# Patient Record
Sex: Male | Born: 1965
Health system: Southern US, Community
[De-identification: ages and names within clinical notes are randomized; demographics above are authoritative.]

## PROBLEM LIST (undated history)

## (undated) DIAGNOSIS — T7840XA Allergy, unspecified, initial encounter: Secondary | ICD-10-CM

## (undated) DIAGNOSIS — K635 Polyp of colon: Secondary | ICD-10-CM

## (undated) DIAGNOSIS — K219 Gastro-esophageal reflux disease without esophagitis: Secondary | ICD-10-CM

## (undated) DIAGNOSIS — J45909 Unspecified asthma, uncomplicated: Secondary | ICD-10-CM

## (undated) DIAGNOSIS — F419 Anxiety disorder, unspecified: Secondary | ICD-10-CM

## (undated) DIAGNOSIS — J302 Other seasonal allergic rhinitis: Secondary | ICD-10-CM

## (undated) HISTORY — PX: UPPER GASTROINTESTINAL ENDOSCOPY: SHX188

## (undated) HISTORY — DX: Allergy, unspecified, initial encounter: T78.40XA

## (undated) HISTORY — DX: Unspecified asthma, uncomplicated: J45.909

## (undated) HISTORY — DX: Anxiety disorder, unspecified: F41.9

## (undated) HISTORY — DX: Gastro-esophageal reflux disease without esophagitis: K21.9

## (undated) HISTORY — PX: COLONOSCOPY: SHX174

## (undated) HISTORY — DX: Other seasonal allergic rhinitis: J30.2

## (undated) HISTORY — DX: Polyp of colon: K63.5

---

## 2004-11-11 ENCOUNTER — Emergency Department (HOSPITAL_COMMUNITY): Admission: EM | Admit: 2004-11-11 | Discharge: 2004-11-11 | Payer: Self-pay | Admitting: Emergency Medicine

## 2007-12-05 ENCOUNTER — Ambulatory Visit: Payer: Self-pay | Admitting: Internal Medicine

## 2007-12-05 LAB — CONVERTED CEMR LAB
AST: 27 units/L (ref 0–37)
Albumin: 4.3 g/dL (ref 3.5–5.2)
Alkaline Phosphatase: 78 units/L (ref 39–117)
BUN: 20 mg/dL (ref 6–23)
Bacteria, UA: NEGATIVE
Basophils Relative: 0.6 % (ref 0.0–3.0)
Chloride: 110 meq/L (ref 96–112)
Cholesterol: 178 mg/dL (ref 0–200)
Crystals: NEGATIVE
Eosinophils Relative: 3.2 % (ref 0.0–5.0)
GFR calc Af Amer: 78 mL/min
Glucose, Bld: 88 mg/dL (ref 70–99)
HCT: 43.7 % (ref 39.0–52.0)
Hemoglobin: 15.4 g/dL (ref 13.0–17.0)
Monocytes Absolute: 0.5 10*3/uL (ref 0.1–1.0)
Monocytes Relative: 8.1 % (ref 3.0–12.0)
Nitrite: NEGATIVE
Platelets: 241 10*3/uL (ref 150–400)
Potassium: 4.3 meq/L (ref 3.5–5.1)
RBC: 4.74 M/uL (ref 4.22–5.81)
Specific Gravity, Urine: >=1.03 (ref 1.000–1.03)
Total Protein, Urine: NEGATIVE mg/dL
Total Protein: 7.7 g/dL (ref 6.0–8.3)
WBC: 6.3 10*3/uL (ref 4.5–10.5)
pH: 6 (ref 5.0–8.0)

## 2007-12-08 ENCOUNTER — Encounter: Payer: Self-pay | Admitting: Internal Medicine

## 2007-12-08 ENCOUNTER — Ambulatory Visit: Payer: Self-pay | Admitting: Internal Medicine

## 2007-12-08 DIAGNOSIS — R9431 Abnormal electrocardiogram [ECG] [EKG]: Secondary | ICD-10-CM

## 2007-12-08 DIAGNOSIS — R1013 Epigastric pain: Secondary | ICD-10-CM

## 2007-12-08 DIAGNOSIS — R0609 Other forms of dyspnea: Secondary | ICD-10-CM

## 2007-12-08 DIAGNOSIS — E786 Lipoprotein deficiency: Secondary | ICD-10-CM | POA: Insufficient documentation

## 2007-12-08 DIAGNOSIS — R319 Hematuria, unspecified: Secondary | ICD-10-CM

## 2007-12-08 DIAGNOSIS — K3189 Other diseases of stomach and duodenum: Secondary | ICD-10-CM | POA: Insufficient documentation

## 2007-12-08 DIAGNOSIS — K589 Irritable bowel syndrome without diarrhea: Secondary | ICD-10-CM

## 2007-12-08 DIAGNOSIS — R0989 Other specified symptoms and signs involving the circulatory and respiratory systems: Secondary | ICD-10-CM

## 2007-12-08 DIAGNOSIS — K219 Gastro-esophageal reflux disease without esophagitis: Secondary | ICD-10-CM

## 2007-12-08 LAB — CONVERTED CEMR LAB
Bilirubin Urine: NEGATIVE
Total Protein, Urine: NEGATIVE mg/dL
Urine Glucose: NEGATIVE mg/dL

## 2007-12-20 ENCOUNTER — Encounter: Payer: Self-pay | Admitting: Internal Medicine

## 2007-12-20 ENCOUNTER — Ambulatory Visit: Payer: Self-pay

## 2007-12-22 ENCOUNTER — Ambulatory Visit: Payer: Self-pay | Admitting: Internal Medicine

## 2009-01-31 ENCOUNTER — Ambulatory Visit: Payer: Self-pay | Admitting: Internal Medicine

## 2010-08-06 ENCOUNTER — Encounter: Payer: Self-pay | Admitting: Internal Medicine

## 2010-08-11 ENCOUNTER — Ambulatory Visit (INDEPENDENT_AMBULATORY_CARE_PROVIDER_SITE_OTHER): Payer: BC Managed Care – PPO | Admitting: Internal Medicine

## 2010-08-11 ENCOUNTER — Encounter: Payer: Self-pay | Admitting: Internal Medicine

## 2010-08-11 ENCOUNTER — Telehealth: Payer: Self-pay

## 2010-08-11 VITALS — BP 118/64 | HR 77 | Temp 97.8°F | Resp 16 | Wt 169.0 lb

## 2010-08-11 DIAGNOSIS — L98499 Non-pressure chronic ulcer of skin of other sites with unspecified severity: Secondary | ICD-10-CM | POA: Insufficient documentation

## 2010-08-11 DIAGNOSIS — G4700 Insomnia, unspecified: Secondary | ICD-10-CM

## 2010-08-11 DIAGNOSIS — K219 Gastro-esophageal reflux disease without esophagitis: Secondary | ICD-10-CM

## 2010-08-11 DIAGNOSIS — J45909 Unspecified asthma, uncomplicated: Secondary | ICD-10-CM | POA: Insufficient documentation

## 2010-08-11 DIAGNOSIS — J45990 Exercise induced bronchospasm: Secondary | ICD-10-CM

## 2010-08-11 MED ORDER — BUDESONIDE-FORMOTEROL FUMARATE 160-4.5 MCG/ACT IN AERO
2.0000 | INHALATION_SPRAY | Freq: Two times a day (BID) | RESPIRATORY_TRACT | Status: DC
Start: 2010-08-11 — End: 2012-05-15

## 2010-08-11 MED ORDER — ZOLPIDEM TARTRATE ER 12.5 MG PO TBCR: 12.5000 mg | EXTENDED_RELEASE_TABLET | Freq: Every evening | ORAL | Status: DC | PRN

## 2010-08-11 NOTE — Assessment & Plan Note (Signed)
Start symbicort

## 2010-08-11 NOTE — Progress Notes (Signed)
Subjective:    Patient ID: George [name], male    DOB: 04-15-1965, 45 y.o.   MRN: [id number]  Wheezing  This is a new problem. The current episode started more than 1 month ago. The problem occurs intermittently. The problem has been unchanged. Pertinent negatives include no abdominal pain, chest pain, chills, coryza, coughing, diarrhea, ear pain, fever, headaches, hemoptysis, neck pain, rash, rhinorrhea, shortness of breath, sore throat, sputum production, swollen glands or vomiting. He has tried nothing for the symptoms. There is no history of asthma, bronchiolitis, chronic lung disease, COPD or pneumonia.      Review of Systems  Constitutional: Negative for fever, chills, diaphoresis, activity change, appetite change, fatigue and unexpected weight change.  HENT: Negative for ear pain, sore throat, facial swelling, rhinorrhea, trouble swallowing, neck pain and neck stiffness.   Eyes: Negative for photophobia and visual disturbance.  Respiratory: Positive for wheezing. Negative for apnea, cough, hemoptysis, sputum production, choking, chest tightness, shortness of breath and stridor.   Cardiovascular: Negative for chest pain, palpitations and leg swelling.  Gastrointestinal: Negative for nausea, vomiting, abdominal pain, diarrhea, constipation, blood in stool, abdominal distention, anal bleeding and rectal pain.  Genitourinary: Negative for dysuria, urgency, frequency, hematuria, decreased urine volume and difficulty urinating.  Musculoskeletal: Negative for myalgias, back pain, joint swelling, arthralgias and gait problem.  Skin: Negative for color change, pallor and rash.  Neurological: Negative for dizziness, tremors, seizures, syncope, facial asymmetry, speech difficulty, weakness, light-headedness, numbness and headaches.  Hematological: Negative for adenopathy. Does not bruise/bleed easily.  Psychiatric/Behavioral: Positive for sleep disturbance (intermittent DFA). Negative for suicidal  ideas, hallucinations, behavioral problems, confusion, self-injury, dysphoric mood, decreased concentration and agitation. The patient is not nervous/anxious and is not hyperactive.        Objective:   Physical Exam  Constitutional: He is oriented to person, place, and time. He appears well-developed and well-nourished. No distress.  HENT:  Head: Normocephalic and atraumatic.  Right Ear: External ear normal.  Left Ear: External ear normal.  Nose: Nose normal.  Mouth/Throat: No oropharyngeal exudate.  Eyes: Conjunctivae and EOM are normal. Pupils are equal, round, and reactive to light. Right eye exhibits no discharge. Left eye exhibits no discharge. No scleral icterus.  Neck: Normal range of motion. Neck supple. No JVD present. No tracheal deviation present. No thyromegaly present.  Cardiovascular: Normal rate, regular rhythm, normal heart sounds and intact distal pulses.  Exam reveals no gallop and no friction rub.   No murmur heard. Pulmonary/Chest: Effort normal and breath sounds normal. No stridor. No respiratory distress. He has no wheezes. He has no rales. He exhibits no tenderness.  Abdominal: Soft. Bowel sounds are normal. He exhibits no distension and no mass. There is no tenderness. There is no rebound and no guarding.  Musculoskeletal: Normal range of motion. He exhibits no edema and no tenderness.  Lymphadenopathy:    He has no cervical adenopathy.  Neurological: He is alert and oriented to person, place, and time. He has normal reflexes. He displays normal reflexes. No cranial nerve deficit. He exhibits normal muscle tone. Coordination normal.  Skin: Skin is warm and intact. No abrasion, no bruising, no burn, no ecchymosis, no lesion and no rash noted. He is not diaphoretic. No erythema. No pallor.       Large callous with ulcer on the plantar side of left foot  Psychiatric: He has a normal mood and affect. His behavior is normal. Judgment and thought content normal.  Assessment & Plan:

## 2010-08-11 NOTE — Patient Instructions (Signed)
Asthma, Adult Asthma is caused by narrowing of the air passages in the lungs. It may be triggered by pollen, dust, animal dander, molds, some foods, respiratory infections, exposure to smoke, exercise, emotional stress or other allergens (things that cause allergic reactions or allergies). Repeat attacks are common. HOME CARE INSTRUCTIONS  Use prescription medications as ordered by your caregiver.   Avoid pollen, dust, animal dander, molds, smoke and other things that cause attacks at home and at work.   You may have fewer attacks if you decrease dust in your home. Electrostatic air cleaners may help.   It may help to replace your pillows or mattress with materials less likely to cause allergies.   Talk to your caregiver about an action plan for managing asthma attacks at home, including, the use of a peak flow meter which measures the severity of your asthma attack. An action plan can help minimize or stop the attack without having to seek medical care.   If you are not on a fluid restriction, drink 8 to 10 glasses of water each day.   Always have a plan prepared for seeking medical attention, including, calling your physician, accessing local emergency care, and calling 911 (in the [place] for a severe attack.   Discuss possible exercise routines with your caregiver.   If animal dander is the cause of asthma, you may need to get rid of pets.  SEEK MEDICAL CARE IF:  You have wheezing and shortness of breath even if taking medicine to prevent attacks.   An oral temperature above 100.5 develops.   You have muscle aches, chest pain or thickening of sputum.   Your sputum changes from clear or white to yellow, green, gray or bloody.   You have any problems that may be related to the medicine you are taking (such as a rash, itching, swelling or trouble breathing).  SEEK IMMEDIATE MEDICAL CARE IF:  Your usual medicines do not stop your wheezing or there is increased coughing and/or  shortness of breath.   You have increased difficulty breathing.  You have an oral temperature above 100.5Insomnia Insomnia is frequent trouble falling and/or staying asleep. Insomnia can be a long term problem or a short term problem. Both are common. Insomnia can be a short term problem when the wakefulness is related to a certain stress or worry. Long term insomnia is often related to ongoing stress during waking hours and/or poor sleeping habits. Overtime, sleep deprivation itself can make the problem worse. Every little thing feels more severe because you are overtired and your ability to cope is decreased. SYMPTOMS Not feeling rested in the morning.  Anxiety and restlessness at bedtime.  Difficulty falling and staying asleep.  CAUSES Stress, anxiety, and depression.  Poor sleeping habits.  Distractions such as TV in the bedroom.  Naps close to bedtime.  Engaging in emotionally charged conversations before bed.  Technical reading before sleep.  Alcohol and other sedatives. They may make the problem worse. They can hurt normal sleep patterns and normal dream activity.  Stimulants such as caffeine for several hours prior to bedtime.  Pain syndromes and shortness of breath can cause insomnia.  Exercise late at night.  Changing time zones may cause sleeping problems (jet lag).  It is sometimes helpful to have someone observe your sleeping patterns. They should look for periods of not breathing during the night (sleep apnea). They should also look to see how long those periods last. If you live alone or observers are uncertain, you  can also be observed at a sleep clinic where your sleep patterns will be professionally monitored. Sleep apnea requires a checkup and treatment. Give your caregivers your medical history. Give your caregivers observations your family has made about your sleep.  TREATMENT Your caregiver may prescribe treatment for an underlying medical disorders. Your caregiver can  give advice or help if you are using alcohol or other drugs for self-medication. Treatment of underlying problems will usually eliminate insomnia problems.  Medications can be prescribed for short time use. They are generally not recommended for lengthy use.  Over-the-counter sleep medicines are not recommended for lengthy use. They can be habit forming.  You can promote easier sleeping by making lifestyle changes such as:  Using relaxation techniques that help with breathing and reduce muscle tension.  Exercising earlier in the day.  Changing your diet and the time of your last meal. No night time snacks.  Establish a regular time to go to bed.  Counseling can help with stressful problems and worry.  Soothing music and white noise may be helpful if there are background noises you cannot remove.  Stop tedious detailed work at least one hour before bedtime.  HOME CARE INSTRUCTIONS Keep a diary. Inform your caregiver about your progress. This includes any medication side effects. See your caregiver regularly. Take note of:  Times when you are asleep. Times when you are awake during the night.  The quality of your sleep. How you feel the next day.   This information will help your caregiver care for you. Get out of bed if you are still awake after 15 minutes. Read or do some quiet activity. Keep the lights down. Wait until you feel sleepy and go back to bed.  Keep regular sleeping and waking hours. Avoid naps.  Exercise regularly.  Avoid distractions at bedtime. Distractions include watching television or engaging in any intense or detailed activity like attempting to balance the household checkbook.  Develop a bedtime ritual. Keep a familiar routine of bathing, brushing your teeth, climbing into bed at the same time each night, listening to soothing music. Routines increase the success of falling to sleep faster.  Use relaxation techniques. This can be using breathing and muscle tension release  routines. It can also include visualizing peaceful scenes. You can also help control troubling or intruding thoughts by keeping your mind occupied with boring or repetitive thoughts like the old concept of counting sheep. You can make it more creative like imagining planting one beautiful flower after another in your backyard garden.  During your day, work to eliminate stress. When this is not possible use some of the previous suggestions to help reduce the anxiety that accompanies stressful situations.  MAKE SURE YOU:  Understand these instructions.  Will watch your condition.  Will get help right away if you are not doing well or get worse.  Document Released: 02/27/2000 Document Re-Released: 02/12/2008  [facility] Patient Information 2011 [place], [place]., not controlled by medicine.  MAKE SURE YOU:  Understand these instructions.   Will watch your condition.   Will get help right away if you are not doing well or get worse.  Document Released: 03/01/2005 Document Re-Released: 03/23/2009 [facility] Patient Information 2011 [place], [place].

## 2010-08-11 NOTE — Telephone Encounter (Signed)
abstract

## 2010-08-11 NOTE — Assessment & Plan Note (Signed)
-  podiatry referral

## 2010-08-11 NOTE — Assessment & Plan Note (Signed)
Try ambien-cr and pt ed material was given as well

## 2010-08-11 NOTE — Assessment & Plan Note (Signed)
This is well-controlled.

## 2011-04-09 ENCOUNTER — Ambulatory Visit (INDEPENDENT_AMBULATORY_CARE_PROVIDER_SITE_OTHER): Payer: BC Managed Care – PPO | Admitting: Internal Medicine

## 2011-04-09 ENCOUNTER — Encounter: Payer: Self-pay | Admitting: Internal Medicine

## 2011-04-09 ENCOUNTER — Ambulatory Visit (INDEPENDENT_AMBULATORY_CARE_PROVIDER_SITE_OTHER)
Admission: RE | Admit: 2011-04-09 | Discharge: 2011-04-09 | Disposition: A | Discharge status: Home / Self Care | Payer: BC Managed Care – PPO | Source: Ambulatory Visit | Attending: Internal Medicine | Admitting: Internal Medicine

## 2011-04-09 VITALS — BP 120/80 | HR 76 | Temp 97.4°F | Resp 16 | Wt 176.0 lb

## 2011-04-09 DIAGNOSIS — J209 Acute bronchitis, unspecified: Secondary | ICD-10-CM | POA: Insufficient documentation

## 2011-04-09 DIAGNOSIS — R05 Cough: Secondary | ICD-10-CM

## 2011-04-09 DIAGNOSIS — R059 Cough, unspecified: Secondary | ICD-10-CM | POA: Insufficient documentation

## 2011-04-09 MED ORDER — GUAIFENESIN-CODEINE 100-10 MG/5ML PO SYRP: 5.0000 mL | ORAL_SOLUTION | Freq: Three times a day (TID) | ORAL | Status: AC | PRN

## 2011-04-09 MED ORDER — MOXIFLOXACIN HCL 400 MG PO TABS: 400.0000 mg | ORAL_TABLET | Freq: Every day | ORAL | Status: AC

## 2011-04-09 NOTE — Progress Notes (Signed)
Subjective:    Patient ID: George [name], male    DOB: December 20, 1965, 46 y.o.   MRN: [id number]  Cough This is a new problem. The current episode started 1 to 4 weeks ago. The problem has been gradually worsening. The problem occurs every few hours. The cough is productive of purulent sputum. Associated symptoms include chills, rhinorrhea and a sore throat. Pertinent negatives include no chest pain, ear congestion, ear pain, fever, headaches, heartburn, hemoptysis, myalgias, nasal congestion, postnasal drip, rash, shortness of breath, sweats, weight loss or wheezing. The symptoms are aggravated by nothing. He has tried OTC cough suppressant for the symptoms. The treatment provided mild relief. His past medical history is significant for asthma.      Review of Systems  Constitutional: Positive for chills. Negative for fever, weight loss, diaphoresis, activity change, appetite change, fatigue and unexpected weight change.  HENT: Positive for sore throat and rhinorrhea. Negative for ear pain, nosebleeds, congestion, sneezing, trouble swallowing, neck pain, neck stiffness, voice change, postnasal drip, sinus pressure and ear discharge.   Eyes: Negative.   Respiratory: Positive for cough. Negative for apnea, hemoptysis, choking, chest tightness, shortness of breath, wheezing and stridor.   Cardiovascular: Negative for chest pain, palpitations and leg swelling.  Gastrointestinal: Negative for heartburn, nausea, vomiting, abdominal pain, diarrhea, constipation, blood in stool and abdominal distention.  Genitourinary: Negative.   Musculoskeletal: Negative for myalgias, back pain, joint swelling, arthralgias and gait problem.  Skin: Negative for color change, pallor, rash and wound.  Neurological: Negative for dizziness, tremors, seizures, syncope, facial asymmetry, speech difficulty, weakness, light-headedness, numbness and headaches.  Hematological: Negative for adenopathy. Does not bruise/bleed easily.    Psychiatric/Behavioral: Negative.        Objective:   Physical Exam  Vitals reviewed. Constitutional: He is oriented to person, place, and time. He appears well-developed and well-nourished. No distress.  HENT:  Head: No trismus in the jaw.  Right Ear: Hearing, tympanic membrane, external ear and ear canal normal.  Left Ear: Hearing, tympanic membrane, external ear and ear canal normal.  Nose: Mucosal edema and rhinorrhea present. Right sinus exhibits no maxillary sinus tenderness and no frontal sinus tenderness. Left sinus exhibits no maxillary sinus tenderness and no frontal sinus tenderness.  Mouth/Throat: Oropharynx is clear and moist and mucous membranes are normal. Mucous membranes are not pale, not dry and not cyanotic. No uvula swelling. No oropharyngeal exudate, posterior oropharyngeal edema, posterior oropharyngeal erythema or tonsillar abscesses.  Eyes: Conjunctivae are normal. Right eye exhibits no discharge. Left eye exhibits no discharge. No scleral icterus.  Neck: Normal range of motion. Neck supple. No JVD present. No tracheal deviation present. No thyromegaly present.  Cardiovascular: Normal rate, regular rhythm, normal heart sounds and intact distal pulses.  Exam reveals no gallop and no friction rub.   No murmur heard. Pulmonary/Chest: Effort normal. No accessory muscle usage or stridor. Not tachypneic. No respiratory distress. He has no decreased breath sounds. He has no wheezes. He has no rhonchi. He has rales in the right lower field. He exhibits no tenderness.  Abdominal: Soft. Bowel sounds are normal. He exhibits no distension and no mass. There is no tenderness. There is no rebound and no guarding.  Musculoskeletal: Normal range of motion. He exhibits no edema and no tenderness.  Lymphadenopathy:    He has no cervical adenopathy.  Neurological: He is oriented to person, place, and time.  Skin: Skin is warm and dry. No rash noted. He is not diaphoretic. No erythema.  No pallor.  Psychiatric: He has a normal mood and affect. His behavior is normal. Judgment and thought content normal.          Assessment & Plan:

## 2011-04-09 NOTE — Assessment & Plan Note (Signed)
Check a CXR for pna, mass, edema

## 2011-04-09 NOTE — Assessment & Plan Note (Signed)
Start avelox and a cough suppressant 

## 2011-04-09 NOTE — Patient Instructions (Signed)

## 2011-06-21 ENCOUNTER — Encounter: Payer: BC Managed Care – PPO | Admitting: Internal Medicine

## 2011-06-25 ENCOUNTER — Other Ambulatory Visit (INDEPENDENT_AMBULATORY_CARE_PROVIDER_SITE_OTHER): Payer: BC Managed Care – PPO

## 2011-06-25 ENCOUNTER — Ambulatory Visit (INDEPENDENT_AMBULATORY_CARE_PROVIDER_SITE_OTHER): Payer: BC Managed Care – PPO | Admitting: Internal Medicine

## 2011-06-25 ENCOUNTER — Encounter: Payer: Self-pay | Admitting: Internal Medicine

## 2011-06-25 VITALS — BP 112/72 | HR 58 | Temp 97.3°F | Resp 16 | Wt 174.0 lb

## 2011-06-25 DIAGNOSIS — Z0000 Encounter for general adult medical examination without abnormal findings: Secondary | ICD-10-CM

## 2011-06-25 DIAGNOSIS — G4700 Insomnia, unspecified: Secondary | ICD-10-CM

## 2011-06-25 DIAGNOSIS — Z23 Encounter for immunization: Secondary | ICD-10-CM

## 2011-06-25 LAB — COMPREHENSIVE METABOLIC PANEL
BUN: 24 mg/dL — ABNORMAL HIGH (ref 6–23)
CO2: 30 mEq/L (ref 19–32)
GFR: 72.31 mL/min (ref >60.00)
Glucose, Bld: 89 mg/dL (ref 70–99)
Sodium: 140 mEq/L (ref 135–145)
Total Bilirubin: 0.6 mg/dL (ref 0.3–1.2)
Total Protein: 7.2 g/dL (ref 6.0–8.3)

## 2011-06-25 LAB — CBC WITH DIFFERENTIAL/PLATELET
Basophils Relative: 0.6 % (ref 0.0–3.0)
Eosinophils Relative: 4.4 % (ref 0.0–5.0)
HCT: 42.3 % (ref 39.0–52.0)
Hemoglobin: 14.3 g/dL (ref 13.0–17.0)
Lymphs Abs: 2.1 10*3/uL (ref 0.7–4.0)
MCV: 92.7 fl (ref 78.0–100.0)
Monocytes Absolute: 0.5 10*3/uL (ref 0.1–1.0)
Monocytes Relative: 8.3 % (ref 3.0–12.0)
Neutro Abs: 3.1 10*3/uL (ref 1.4–7.7)
Platelets: 209 10*3/uL (ref 150.0–400.0)
RBC: 4.56 Mil/uL (ref 4.22–5.81)
WBC: 6 10*3/uL (ref 4.5–10.5)

## 2011-06-25 LAB — LIPID PANEL
Cholesterol: 221 mg/dL — ABNORMAL HIGH (ref 0–200)
HDL: 52.1 mg/dL (ref >39.00)

## 2011-06-25 LAB — TSH: TSH: 3.07 u[IU]/mL (ref 0.35–5.50)

## 2011-06-25 MED ORDER — ZOLPIDEM TARTRATE ER 12.5 MG PO TBCR: 12.5000 mg | EXTENDED_RELEASE_TABLET | Freq: Every evening | ORAL | Status: DC | PRN

## 2011-06-25 NOTE — Assessment & Plan Note (Signed)
Exam done, vaccines were updated, pt ed material was given, labs ordered 

## 2011-06-25 NOTE — Patient Instructions (Signed)
Health Maintenance, Males A healthy lifestyle and preventative care can promote health and wellness.  Maintain regular health, dental, and eye exams.   Eat a healthy diet. Foods like vegetables, fruits, whole grains, low-fat dairy products, and lean protein foods contain the nutrients you need without too many calories. Decrease your intake of foods high in solid fats, added sugars, and salt. Get information about a proper diet from your caregiver, if necessary.   Regular physical exercise is one of the most important things you can do for your health. Most adults should get at least 150 minutes of moderate-intensity exercise (any activity that increases your heart rate and causes you to sweat) each week. In addition, most adults need muscle-strengthening exercises on 2 or more days a week.    Maintain a healthy weight. The body mass index (BMI) is a screening tool to identify possible weight problems. It provides an estimate of body fat based on height and weight. Your caregiver can help determine your BMI, and can help you achieve or maintain a healthy weight. For adults 20 years and older:   A BMI below 18.5 is considered underweight.   A BMI of 18.5 to 24.9 is normal.   A BMI of 25 to 29.9 is considered overweight.   A BMI of 30 and above is considered obese.   Maintain normal blood lipids and cholesterol by exercising and minimizing your intake of saturated fat. Eat a balanced diet with plenty of fruits and vegetables. Blood tests for lipids and cholesterol should begin at age 20 and be repeated every 5 years. If your lipid or cholesterol levels are high, you are over 50, or you are a high risk for heart disease, you may need your cholesterol levels checked more frequently.Ongoing high lipid and cholesterol levels should be treated with medicines, if diet and exercise are not effective.   If you smoke, find out from your caregiver how to quit. If you do not use tobacco, do not start.    If you choose to drink alcohol, do not exceed 2 drinks per day. One drink is considered to be 12 ounces (355 mL) of beer, 5 ounces (148 mL) of wine, or 1.5 ounces (44 mL) of liquor.   Avoid use of street drugs. Do not share needles with anyone. Ask for help if you need support or instructions about stopping the use of drugs.   High blood pressure causes heart disease and increases the risk of stroke. Blood pressure should be checked at least every 1 to 2 years. Ongoing high blood pressure should be treated with medicines if weight loss and exercise are not effective.   If you are 45 to 45 years old, ask your caregiver if you should take aspirin to prevent heart disease.   Diabetes screening involves taking a blood sample to check your fasting blood sugar level. This should be done once every 3 years, after age 45, if you are within normal weight and without risk factors for diabetes. Testing should be considered at a younger age or be carried out more frequently if you are overweight and have at least 1 risk factor for diabetes.   Colorectal cancer can be detected and often prevented. Most routine colorectal cancer screening begins at the age of 50 and continues through age 75. However, your caregiver may recommend screening at an earlier age if you have risk factors for colon cancer. On a yearly basis, your caregiver may provide home test kits to check for hidden   blood in the stool. Use of a small camera at the end of a tube, to directly examine the colon (sigmoidoscopy or colonoscopy), can detect the earliest forms of colorectal cancer. Talk to your caregiver about this at age 50, when routine screening begins. Direct examination of the colon should be repeated every 5 to 10 years through age 75, unless early forms of pre-cancerous polyps or small growths are found.   Hepatitis C blood testing is recommended for all people born from 1945 through 1965 and any individual with known risks for  hepatitis C.   Healthy men should no longer receive prostate-specific antigen (PSA) blood tests as part of routine cancer screening. Consult with your caregiver about prostate cancer screening.   Testicular cancer screening is not recommended for adolescents or adult males who have no symptoms. Screening includes self-exam, caregiver exam, and other screening tests. Consult with your caregiver about any symptoms you have or any concerns you have about testicular cancer.   Practice safe sex. Use condoms and avoid high-risk sexual practices to reduce the spread of sexually transmitted infections (STIs).   Use sunscreen with a sun protection factor (SPF) of 30 or greater. Apply sunscreen liberally and repeatedly throughout the day. You should seek shade when your shadow is shorter than you. Protect yourself by wearing long sleeves, pants, a wide-brimmed hat, and sunglasses year round, whenever you are outdoors.   Notify your caregiver of new moles or changes in moles, especially if there is a change in shape or color. Also notify your caregiver if a mole is larger than the size of a pencil eraser.   A one-time screening for abdominal aortic aneurysm (AAA) and surgical repair of large AAAs by sound wave imaging (ultrasonography) is recommended for ages 65 to 75 years who are current or former smokers.   Stay current with your immunizations.  Document Released: 08/28/2007 Document Revised: 02/18/2011 Document Reviewed: 07/27/2010 [employer] Patient Information 2012 [employer]. 

## 2011-06-25 NOTE — Progress Notes (Signed)
  Subjective:    Patient ID: George [name], male    DOB: [birth year], 46 y.o.   MRN: [id number]  HPI He returns for a routine physical and tells me that he feels well and offers no complaints,   Review of Systems  Constitutional: Negative.   HENT: Negative.   Eyes: Negative.   Respiratory: Negative.   Cardiovascular: Negative.   Gastrointestinal: Negative.   Genitourinary: Negative.   Musculoskeletal: Negative.   Skin: Negative.   Neurological: Negative.   Hematological: Negative.   Psychiatric/Behavioral: Negative.        Objective:   Physical Exam  Vitals reviewed. Constitutional: He is oriented to person, place, and time. He appears well-developed and well-nourished. No distress.  HENT:  Head: Normocephalic and atraumatic.  Mouth/Throat: Oropharynx is clear and moist. No oropharyngeal exudate.  Eyes: Conjunctivae are normal. Right eye exhibits no discharge. Left eye exhibits no discharge. No scleral icterus.  Neck: Normal range of motion. Neck supple. No JVD present. No tracheal deviation present. No thyromegaly present.  Cardiovascular: Normal rate, regular rhythm, normal heart sounds and intact distal pulses.  Exam reveals no gallop and no friction rub.   No murmur heard. Pulmonary/Chest: Effort normal and breath sounds normal. No stridor. No respiratory distress. He has no wheezes. He has no rales. He exhibits no tenderness.  Abdominal: Soft. Bowel sounds are normal. He exhibits no distension and no mass. There is no tenderness. There is no rebound and no guarding. Hernia confirmed negative in the right inguinal area and confirmed negative in the left inguinal area.  Genitourinary: Testes normal and penis normal. Right testis shows no mass, no swelling and no tenderness. Right testis is descended. Left testis shows no mass, no swelling and no tenderness. Left testis is descended. Circumcised. No penile tenderness. No discharge found.  Musculoskeletal: Normal range of motion. He  exhibits no edema and no tenderness.  Lymphadenopathy:    He has no cervical adenopathy.       Right: No inguinal adenopathy present.       Left: No inguinal adenopathy present.  Neurological: He is oriented to person, place, and time.  Skin: Skin is warm and dry. No rash noted. He is not diaphoretic. No erythema. No pallor.  Psychiatric: He has a normal mood and affect. His behavior is normal. Judgment and thought content normal.      Lab Results  Component Value Date   WBC 6.3 12/05/2007   HGB 15.4 12/05/2007   HCT 43.7 12/05/2007   PLT 241 12/05/2007   GLUCOSE 88 12/05/2007   CHOL 178 12/05/2007   TRIG 100 12/05/2007   HDL 34.2* 12/05/2007   LDLCALC 124* 12/05/2007   ALT 25 12/05/2007   AST 27 12/05/2007   NA 143 12/05/2007   K 4.3 12/05/2007   CL 110 12/05/2007   CREATININE 1.3 12/05/2007   BUN 20 12/05/2007   CO2 31 12/05/2007   TSH 3.76 12/05/2007   PSA 0.60 12/05/2007      Assessment & Plan:

## 2011-06-30 ENCOUNTER — Telehealth: Payer: Self-pay | Admitting: *Deleted

## 2011-06-30 DIAGNOSIS — L98499 Non-pressure chronic ulcer of skin of other sites with unspecified severity: Secondary | ICD-10-CM

## 2011-06-30 NOTE — Telephone Encounter (Signed)
Patient request referral to Podiatrists [as done last year], so that he may get custom foot orthotics. Please advise.

## 2011-12-31 ENCOUNTER — Ambulatory Visit: Payer: BC Managed Care – PPO | Admitting: Endocrinology

## 2011-12-31 ENCOUNTER — Telehealth: Payer: Self-pay | Admitting: Internal Medicine

## 2011-12-31 MED ORDER — KETOCONAZOLE 2 % EX CREA: TOPICAL_CREAM | Freq: Two times a day (BID) | CUTANEOUS | Status: DC

## 2011-12-31 NOTE — Telephone Encounter (Signed)
Caller: Wilberth/Patient; Patient Name: George [name]; PCP: Sanda Linger (Adults only); Best Callback Phone Number: [phone]  Patient states he developed "jock itch", onset X several weeks. States has tried OTC Lotrimin without relief. Describes rash as red, itchy rash of bilateral groin area and genital area. Afebrile. States itching has increased, unrelieved by Zyrtec. States itching interferes with sleep and exercise. States spouse was recently treated for  yeast infection. Triage per Rash Protocol. No emergent symptoms identified. Care advice given per guidelines related to triage assessment for " Severe or persistent itching that interferes with ability to carry out usual activities or sleep."  Patient advised to wear loose fitting clothing, cool compresses to area. Patient advised tepid bath with Aveeno or baking soda. Call back parameters reviewed.  Patient verbalizes understanding. Disposition obtained of " See Provider within 24 hours." No appts. available for 12/31/11.  PATIENT REQUESTS APPT. FOR 12/31/11. PLEASE RETURN CALL TO PATIENT AT [phone] REGARDING POSSIBLE WORK IN APPT. Patient uses [facility] on [address] at [phone].

## 2011-12-31 NOTE — Telephone Encounter (Signed)
Try ketoconazole cream

## 2012-01-27 ENCOUNTER — Encounter: Payer: Self-pay | Admitting: Internal Medicine

## 2012-01-27 ENCOUNTER — Ambulatory Visit (INDEPENDENT_AMBULATORY_CARE_PROVIDER_SITE_OTHER): Payer: BC Managed Care – PPO | Admitting: Internal Medicine

## 2012-01-27 VITALS — BP 102/70 | HR 64 | Temp 96.8°F | Ht 68.0 in | Wt 177.0 lb

## 2012-01-27 DIAGNOSIS — G4700 Insomnia, unspecified: Secondary | ICD-10-CM

## 2012-01-27 DIAGNOSIS — B356 Tinea cruris: Secondary | ICD-10-CM

## 2012-01-27 DIAGNOSIS — Z23 Encounter for immunization: Secondary | ICD-10-CM

## 2012-01-27 MED ORDER — GRISEOFULVIN ULTRAMICROSIZE 250 MG PO TABS: 250.0000 mg | ORAL_TABLET | Freq: Two times a day (BID) | ORAL | Status: DC

## 2012-01-27 MED ORDER — ZOLPIDEM TARTRATE ER 12.5 MG PO TBCR: 12.5000 mg | EXTENDED_RELEASE_TABLET | Freq: Every evening | ORAL | Status: DC | PRN

## 2012-01-27 MED ORDER — KETOCONAZOLE POWD: 1000.0000 mg | Freq: Two times a day (BID) | Status: DC

## 2012-01-27 NOTE — Progress Notes (Signed)
Subjective:    Patient ID: George [name], male    DOB: 01/19/1966, 46 y.o.   MRN: [id number]  HPI  Pt reports to the clinic today with c/o a rash that he thinks is jock itch in his groin. He has had it x 1 month. Dr. Jonny Ruiz called him in some ketoconazole cream which has helped but it has not resolved. It is not painful but it is itchy. The itching is constant but worse when he is trying to sleep. He doesn't really like the cream because it makes the are feel moist. He has had jock itch before but never for this long. Pt would also like a refill of his ambien until he can get back in for a follow up with Dr. Jonny Ruiz.  Review of Systems      Past Medical History  Diagnosis Date  . GERD (gastroesophageal reflux disease)   . Fainting spell as child    Current Outpatient Prescriptions  Medication Sig Dispense Refill  . ketoconazole (NIZORAL) 2 % cream Apply topically 2 (two) times daily.  60 g  2  . omeprazole (PRILOSEC) 40 MG capsule Take 40 mg by mouth daily.        . pseudoephedrine (SUDAFED) 30 MG tablet Take 30 mg by mouth.        . zolpidem (AMBIEN CR) 12.5 MG CR tablet Take 1 tablet (12.5 mg total) by mouth at bedtime as needed for sleep.  30 tablet  0  . [DISCONTINUED] zolpidem (AMBIEN CR) 12.5 MG CR tablet Take 1 tablet (12.5 mg total) by mouth at bedtime as needed for sleep.  30 tablet  2  . budesonide-formoterol (SYMBICORT) 160-4.5 MCG/ACT inhaler Inhale 2 puffs into the lungs 2 (two) times daily.  5 Inhaler  0  . griseofulvin (GRIS-PEG) 250 MG tablet Take 1 tablet (250 mg total) by mouth 2 (two) times daily.  28 tablet  0  . KETOCONAZOLE, BULK, POWD 1,000 mg by Does not apply route 2 (two) times daily.  1 Bottle  0    No Known Allergies  Family History  Problem Relation Age of Onset  . Obesity Father   . Asthma Father   . Hypertension Father     History   Social History  . Marital Status: Married    Spouse Name: N/A    Number of Children: N/A  . Years of Education: N/A     Occupational History  . Prof     [employer]   Social History Main Topics  . Smoking status: Never Smoker   . Smokeless tobacco: Never Used  . Alcohol Use: No  . Drug Use: No  . Sexually Active: Yes   Other Topics Concern  . Not on file   Social History Narrative   Regular exercise-yes     Constitutional: Denies fever, malaise, fatigue, headache or abrupt weight changes.  GU: Denies urgency, frequency, pain with urination, burning sensation, blood in urine, odor or discharge. Skin: Pt reports rash in the groin. Denies redness,  lesions or ulcercations.   No other specific complaints in a complete review of systems (except as listed in HPI above).  Objective:   Physical Exam  BP 102/70  Pulse 64  Temp 96.8 F (36 C) (Oral)  Ht 5\' 8"  (1.727 m)  Wt 177 lb (80.287 kg)  BMI 26.91 kg/m2  SpO2 97% Wt Readings from Last 3 Encounters:  01/27/12 177 lb (80.287 kg)  06/25/11 174 lb (78.926 kg)  04/09/11 176  lb (79.833 kg)    General: Appears their stated age, well developed, well nourished in NAD. Skin: Yeast noted in bilateral groins and underneath the scrotum.   Skin otherwise arm, dry and intact. No lesions or ulcerations noted.  Cardiovascular: Normal rate and rhythm. S1,S2 noted.  No murmur, rubs or gallops noted. No JVD or BLE edema. No carotid bruits noted. Pulmonary/Chest: Normal effort and positive vesicular breath sounds. No respiratory distress. No wheezes, rales or ronchi noted.       Assessment & Plan:   Tinea Cruris, new problem with additional workup required:  Stop using Ketoconazole cream and use Ketoconazole powder instead eRx given for Griseofulvin 20 mg BID x 14 days Dry the area well after showering  Insomnia  RX given for ambien, no refills until followup with dr Jonny Ruiz.  RTC as needed

## 2012-01-27 NOTE — Patient Instructions (Addendum)
Jock Itch Jock itch is a fungal infection of the skin in the groin area. It is sometimes called "ringworm" even though it is not caused by a worm. A fungus is a type of germ that thrives in dark, damp places.  CAUSES  This infection may spread from:  A fungus infection elsewhere on the body (such as athlete's foot).  Sharing towels or clothing. This infection is more common in:  Hot, humid climates.  People who wear tight-fitting clothing or wet bathing suits for long periods of time.  Athletes.  Overweight people.  People with diabetes. SYMPTOMS  Jock itch causes the following symptoms:  Red, pink or brown rash in the groin. Rash may spread to the thighs, anus, and buttocks.  Itching. DIAGNOSIS  Your caregiver may make the diagnosis by looking at the rash. Sometimes a skin scraping will be sent to test for fungus. Testing can be done either by looking under the microscope or by doing a culture (test to try to grow the fungus). A culture can take up to 2 weeks to come back. TREATMENT  Jock itch may be treated with:  Skin cream or ointment to kill fungus.  Medicine by mouth to kill fungus.  Skin cream or ointment to calm the itching.  Compresses or medicated powders to dry the infected skin. HOME CARE INSTRUCTIONS   Be sure to treat the rash completely. Follow your caregiver's instructions. It can take a couple of weeks to treat. If you do not treat the infection long enough, the rash can come back.  Wear loose-fitting clothing.  Men should wear cotton boxer shorts.  Women should wear cotton underwear.  Avoid hot baths.  Dry the groin area well after bathing. SEEK MEDICAL CARE IF:   Your rash is worse.  Your rash is spreading.  Your rash returns after treatment is finished.  Your rash is not gone in 4 weeks. Fungal infections are slow to respond to treatment. Some redness may remain for several weeks after the fungus is gone. SEEK IMMEDIATE MEDICAL CARE  IF:  The area becomes red, warm, tender, and swollen.  You have a fever. Document Released: 02/19/2002 Document Revised: 05/24/2011 Document Reviewed: 01/19/2008 ExitCare Patient Information 2013 [employer].  

## 2012-01-28 ENCOUNTER — Telehealth: Payer: Self-pay | Admitting: *Deleted

## 2012-01-28 NOTE — Telephone Encounter (Signed)
Per [facility], they only dispense it as a cream, foam or shampoo. They do not have the powder and are unsure if it comes in a powder.

## 2012-01-28 NOTE — Telephone Encounter (Signed)
R'cd fax from [facility] asking if it is okay to change Ketoconazole Powder to Ketoconazole cream because they do not have the powder.

## 2012-01-28 NOTE — Telephone Encounter (Signed)
Pt already had the cream. He specifically requested the powder. Can he get it from another [facility]? Rene Kocher

## 2012-01-31 NOTE — Telephone Encounter (Signed)
Ash, The pharmacy is confused. This medication does come in a powder but if they dont have it that's fine. Can you please call the patient and let him know the pharmacy does not have the powder and for him to use the ketoconazole cream that he already has. Thx! Rene Kocher

## 2012-01-31 NOTE — Telephone Encounter (Signed)
Left message for pt to callback office.  

## 2012-02-01 NOTE — Telephone Encounter (Signed)
Pt informed. Pt is using cream and medication given at OV and he states that it seems to be working.

## 2012-05-15 ENCOUNTER — Encounter: Payer: Self-pay | Admitting: Internal Medicine

## 2012-05-15 ENCOUNTER — Ambulatory Visit (INDEPENDENT_AMBULATORY_CARE_PROVIDER_SITE_OTHER): Payer: BC Managed Care – PPO | Admitting: Internal Medicine

## 2012-05-15 VITALS — BP 128/82 | HR 54 | Temp 97.1°F | Resp 16 | Wt 176.5 lb

## 2012-05-15 DIAGNOSIS — J45990 Exercise induced bronchospasm: Secondary | ICD-10-CM

## 2012-05-15 DIAGNOSIS — J45909 Unspecified asthma, uncomplicated: Secondary | ICD-10-CM

## 2012-05-15 DIAGNOSIS — G4700 Insomnia, unspecified: Secondary | ICD-10-CM

## 2012-05-15 DIAGNOSIS — K219 Gastro-esophageal reflux disease without esophagitis: Secondary | ICD-10-CM

## 2012-05-15 MED ORDER — TRAZODONE HCL 50 MG PO TABS: 25.0000 mg | ORAL_TABLET | Freq: Every evening | ORAL | Status: DC | PRN

## 2012-05-15 MED ORDER — BUDESONIDE-FORMOTEROL FUMARATE 160-4.5 MCG/ACT IN AERO: 2.0000 | INHALATION_SPRAY | Freq: Two times a day (BID) | RESPIRATORY_TRACT | Status: DC

## 2012-05-15 MED ORDER — DEXLANSOPRAZOLE 60 MG PO CPDR: 60.0000 mg | DELAYED_RELEASE_CAPSULE | Freq: Every day | ORAL | Status: DC

## 2012-05-15 NOTE — Assessment & Plan Note (Signed)
He does not want to take Remus Loffler anymore because it causes a groogy feeling the next day He will try trazodone

## 2012-05-15 NOTE — Progress Notes (Signed)
Subjective:    Patient ID: George [name], male    DOB: January 19, 1966, 47 y.o.   MRN: [id number]  Gastrophageal Reflux He complains of belching, chest pain, heartburn, water brash and wheezing. He reports no abdominal pain, no choking, no coughing, no dysphagia, no early satiety, no globus sensation, no hoarse voice, no nausea, no sore throat, no stridor or no tooth decay. This is a chronic problem. The current episode started more than 1 year ago. The problem occurs frequently. The problem has been gradually worsening. The heartburn duration is several minutes. The heartburn is located in the substernum. The heartburn is of moderate intensity. The heartburn does not wake him from sleep. The heartburn does not limit his activity. The heartburn doesn't change with position. The symptoms are aggravated by caffeine. Pertinent negatives include no anemia, fatigue, melena, muscle weakness, orthopnea or weight loss. Risk factors include caffeine use. He has tried a PPI and a histamine-2 antagonist for the symptoms. The treatment provided mild relief.  Asthma He complains of wheezing. There is no chest tightness, cough, difficulty breathing, frequent throat clearing, hemoptysis, hoarse voice, shortness of breath or sputum production. This is a chronic problem. The current episode started more than 1 year ago. The problem occurs intermittently. The problem has been unchanged. Associated symptoms include chest pain and heartburn. Pertinent negatives include no appetite change, dyspnea on exertion, ear congestion, ear pain, fever, headaches, malaise/fatigue, myalgias, nasal congestion, orthopnea, PND, postnasal drip, rhinorrhea, sneezing, sore throat, sweats, trouble swallowing or weight loss. His past medical history is significant for asthma.      Review of Systems  Constitutional: Negative for fever, chills, weight loss, malaise/fatigue, diaphoresis, activity change, appetite change, fatigue and unexpected weight  change.  HENT: Negative.  Negative for ear pain, sore throat, hoarse voice, rhinorrhea, sneezing, trouble swallowing and postnasal drip.   Eyes: Negative.   Respiratory: Positive for wheezing. Negative for apnea, cough, hemoptysis, sputum production, choking, chest tightness, shortness of breath and stridor.   Cardiovascular: Positive for chest pain. Negative for dyspnea on exertion, palpitations, leg swelling and PND.  Gastrointestinal: Positive for heartburn. Negative for dysphagia, nausea, vomiting, abdominal pain, diarrhea, constipation, melena and anal bleeding.  Endocrine: Negative.   Genitourinary: Negative.   Musculoskeletal: Negative.  Negative for myalgias and muscle weakness.  Skin: Negative.   Allergic/Immunologic: Negative.   Neurological: Negative.  Negative for dizziness, weakness, light-headedness and headaches.  Hematological: Negative for adenopathy. Does not bruise/bleed easily.  Psychiatric/Behavioral: Positive for sleep disturbance (DFA). Negative for suicidal ideas, hallucinations, behavioral problems, confusion, self-injury, dysphoric mood, decreased concentration and agitation. The patient is not nervous/anxious and is not hyperactive.        Objective:   Physical Exam  Vitals reviewed. Constitutional: He is oriented to person, place, and time. He appears well-developed and well-nourished. No distress.  HENT:  Head: Normocephalic and atraumatic.  Mouth/Throat: Oropharynx is clear and moist. No oropharyngeal exudate.  Eyes: Conjunctivae are normal. Right eye exhibits no discharge. Left eye exhibits no discharge. No scleral icterus.  Neck: Normal range of motion. Neck supple. No JVD present. No tracheal deviation present. No thyromegaly present.  Cardiovascular: Normal rate, regular rhythm, normal heart sounds and intact distal pulses.  Exam reveals no gallop and no friction rub.   No murmur heard. Pulmonary/Chest: Effort normal and breath sounds normal. No accessory  muscle usage or stridor. Not tachypneic. No respiratory distress. He has no decreased breath sounds. He has no wheezes. He has no rhonchi. He has no rales. He  exhibits no tenderness.  Abdominal: Soft. Bowel sounds are normal. He exhibits no distension and no mass. There is no tenderness. There is no rebound and no guarding.  Musculoskeletal: Normal range of motion. He exhibits no edema and no tenderness.  Lymphadenopathy:    He has no cervical adenopathy.  Neurological: He is oriented to person, place, and time.  Skin: Skin is warm and dry. No rash noted. He is not diaphoretic. No erythema. No pallor.  Psychiatric: He has a normal mood and affect. His behavior is normal. Judgment and thought content normal.     Lab Results  Component Value Date   WBC 6.0 06/25/2011   HGB 14.3 06/25/2011   HCT 42.3 06/25/2011   PLT 209.0 06/25/2011   GLUCOSE 89 06/25/2011   CHOL 221* 06/25/2011   TRIG 47.0 06/25/2011   HDL 52.10 06/25/2011   LDLDIRECT 159.3 06/25/2011   LDLCALC 124* 12/05/2007   ALT 19 06/25/2011   AST 23 06/25/2011   NA 140 06/25/2011   K 4.0 06/25/2011   CL 104 06/25/2011   CREATININE 1.2 06/25/2011   BUN 24* 06/25/2011   CO2 30 06/25/2011   TSH 3.07 06/25/2011   PSA 0.60 12/05/2007       Assessment & Plan:

## 2012-05-15 NOTE — Patient Instructions (Signed)

## 2012-05-15 NOTE — Assessment & Plan Note (Signed)
I will change his PPI to dexilant He has had GERD for 20 years so I have asked him to see GI to consider getting an EGD done to look for complications like Barrett's/stricture/etc.

## 2012-05-15 NOTE — Assessment & Plan Note (Signed)
I have asked him to restart symbicort 

## 2012-06-14 ENCOUNTER — Other Ambulatory Visit: Payer: Self-pay

## 2012-06-14 DIAGNOSIS — K219 Gastro-esophageal reflux disease without esophagitis: Secondary | ICD-10-CM

## 2012-06-14 MED ORDER — DEXLANSOPRAZOLE 60 MG PO CPDR: 60.0000 mg | DELAYED_RELEASE_CAPSULE | Freq: Every day | ORAL | Status: DC

## 2012-07-11 ENCOUNTER — Encounter: Payer: Self-pay | Admitting: Internal Medicine

## 2012-07-27 ENCOUNTER — Encounter: Payer: Self-pay | Admitting: Internal Medicine

## 2012-07-31 ENCOUNTER — Encounter: Payer: Self-pay | Admitting: Internal Medicine

## 2012-07-31 ENCOUNTER — Ambulatory Visit (INDEPENDENT_AMBULATORY_CARE_PROVIDER_SITE_OTHER): Payer: BC Managed Care – PPO | Admitting: Internal Medicine

## 2012-07-31 VITALS — BP 102/66 | HR 72 | Ht 68.5 in | Wt 174.2 lb

## 2012-07-31 DIAGNOSIS — R140 Abdominal distension (gaseous): Secondary | ICD-10-CM

## 2012-07-31 DIAGNOSIS — R142 Eructation: Secondary | ICD-10-CM

## 2012-07-31 DIAGNOSIS — K219 Gastro-esophageal reflux disease without esophagitis: Secondary | ICD-10-CM

## 2012-07-31 MED ORDER — DEXLANSOPRAZOLE 60 MG PO CPDR: 60.0000 mg | DELAYED_RELEASE_CAPSULE | Freq: Every day | ORAL | Status: DC

## 2012-07-31 NOTE — Progress Notes (Signed)
Patient ID: George [name], male   DOB: 09/29/1965, 47 y.o.   MRN: [id number] HPI: George [name] is a 47 year old male with a past medical history of GERD, mild asthma, and possibly IBS who is seen in consultation at the request of Dr. Yetta Barre for evaluation of long-standing reflux. The patient reports many years, even decades of chronic heartburn. In the past he had used H2 blockers and over-the-counter PPIs with some success, though frequent breakthrough symptoms. He was recently changed to Dexilant 60 mg daily by Dr. Yetta Barre. He reports this has significantly improved his symptoms and estimates there may be 80% better. He still occasionally has breakthrough reflux symptoms at night which can be heartburn or mild epigastric pain. He does occasionally feel gas pressure, or mild bloating at times worsened by dairy food. In the past the latter symptoms have been attributed to IBS. He is drinking chamomile tea which he feels helps his nighttime symptoms. He denies dysphagia, odynophagia, nausea or vomiting. No black or tarry stools. No blood in his stool. No other abdominal pain or change in bowel habits. He has noted maybe minimal improvement in his mild asthma symptoms with PPI therapy. His asthma in the past had been exercise-induced.  No family history of esophageal, colon or other GI malignancy. He has never had an endoscopy  Patient Active Problem List   Diagnosis Date Noted  . Routine general medical examination at a health care facility 06/25/2011  . Asthma, chronic 08/11/2010  . Insomnia 08/11/2010  . LOW HDL 12/08/2007  . GERD 12/08/2007  . Irritable bowel syndrome 12/08/2007    History reviewed. No pertinent past surgical history.  Current Outpatient Prescriptions  Medication Sig Dispense Refill  . budesonide-formoterol (SYMBICORT) 160-4.5 MCG/ACT inhaler Inhale 2 puffs into the lungs 2 (two) times daily.  1 Inhaler  11  . dexlansoprazole (DEXILANT) 60 MG capsule Take 1 capsule (60 mg total) by  mouth daily.  90 capsule  3  . pseudoephedrine (SUDAFED) 30 MG tablet Take 30 mg by mouth.        . traZODone (DESYREL) 50 MG tablet Take 0.5-1 tablets (25-50 mg total) by mouth at bedtime as needed for sleep.  90 tablet  3   No current facility-administered medications for this visit.    No Known Allergies  Family History  Problem Relation Age of Onset  . Obesity Father   . Asthma Father   . Hypertension Father   . Bladder Cancer Father   . Diabetes Neg Hx   . Early death Neg Hx   . Heart disease Neg Hx   . Hyperlipidemia Neg Hx   . Kidney disease Neg Hx   . Stroke Neg Hx     History  Substance Use Topics  . Smoking status: Never Smoker   . Smokeless tobacco: Never Used  . Alcohol Use: Yes    ROS: As per history of present illness, otherwise negative  BP 102/66  Pulse 72  Ht 5' 8.5" (1.74 m)  Wt 174 lb 3.2 oz (79.017 kg)  BMI 26.1 kg/m2 Constitutional: Well-developed and well-nourished. No distress. HEENT: Normocephalic and atraumatic. Oropharynx is clear and moist. No oropharyngeal exudate. Conjunctivae are normal.  No scleral icterus. Neck: Neck supple. Trachea midline. Cardiovascular: Normal rate, regular rhythm and intact distal pulses. No M/R/G Pulmonary/chest: Effort normal and breath sounds normal. No wheezing, rales or rhonchi. Abdominal: Soft, nontender, nondistended. Bowel sounds active throughout. There are no masses palpable. No hepatosplenomegaly. Extremities: no clubbing, cyanosis, or edema Neurological: Alert  and oriented to person place and time. Skin: Skin is warm and dry. No rashes noted. Psychiatric: Normal mood and affect. Behavior is normal.  RELEVANT LABS AND IMAGING: CBC    Component Value Date/Time   WBC 6.0 06/25/2011 0926   RBC 4.56 06/25/2011 0926   HGB 14.3 06/25/2011 0926   HCT 42.3 06/25/2011 0926   PLT 209.0 06/25/2011 0926   MCV 92.7 06/25/2011 0926   MCHC 33.8 06/25/2011 0926   RDW 13.0 06/25/2011 0926   LYMPHSABS 2.1 06/25/2011  0926   MONOABS 0.5 06/25/2011 0926   EOSABS 0.3 06/25/2011 0926   BASOSABS 0.0 06/25/2011 0926    CMP     Component Value Date/Time   NA 140 06/25/2011 0926   K 4.0 06/25/2011 0926   CL 104 06/25/2011 0926   CO2 30 06/25/2011 0926   GLUCOSE 89 06/25/2011 0926   BUN 24* 06/25/2011 0926   CREATININE 1.2 06/25/2011 0926   CALCIUM 9.1 06/25/2011 0926   PROT 7.2 06/25/2011 0926   ALBUMIN 4.2 06/25/2011 0926   AST 23 06/25/2011 0926   ALT 19 06/25/2011 0926   ALKPHOS 64 06/25/2011 0926   BILITOT 0.6 06/25/2011 0926   GFRNONAA 65 12/05/2007 1039   GFRAA 78 12/05/2007 1039    ASSESSMENT/PLAN:  47 year old male with a past medical history of GERD, mild asthma, and possibly IBS who is seen in consultation at the request of Dr. Yetta Barre for evaluation of long-standing reflux.  1.  GERD, chronic -- the patient's GERD symptoms are much better control with the addition of Dexilant 60 mg daily. He does have long-standing heartburn symptoms, and we discussed EGD to rule out Barrett's esophagus. We discussed how Barrett's esophagus is more common in Caucasian males, and for him this makes endoscopy reasonable. We discussed the test, including the risks and benefits and he is agreeable to proceed. He'll continue with Dexilant 60 mg daily.  2.  Gas and bloating -- it does sound that he may have some irritable bowel symptoms, though he may have some element of lactose intolerance as well. We have discussed this, and I recommended either lactose free dairy products or the addition of over-the-counter Lactaid with any lactose containing meal.

## 2012-07-31 NOTE — Patient Instructions (Addendum)
You have been scheduled for an endoscopy with propofol. Please follow written instructions given to you at your visit today. If you use inhalers (even only as needed), please bring them with you on the day of your procedure. Your physician has requested that you go to www.[email or web address] and enter the access code given to you at your visit today. This web site gives a general overview about your procedure. However, you should still follow specific instructions given to you by our office regarding your preparation for the procedure.  We have sent the following medications to your pharmacy for you to pick up at your convenience: Dexilant                                                We are excited to introduce MyChart, a new best-in-class service that provides you online access to important information in your electronic medical record. We want to make it easier for you to view your health information - all in one secure location - when and where you need it. We expect MyChart will enhance the quality of care and service we provide.  When you register for MyChart, you can:    View your test results.    Request appointments and receive appointment reminders via email.    Request medication renewals.    View your medical history, allergies, medications and immunizations.    Communicate with your physician's office through a password-protected site.    Conveniently print information such as your medication lists.  To find out if MyChart is right for you, please talk to a member of our clinical staff today. We will gladly answer your questions about this free health and wellness tool.  If you are age 46 or older and want a member of your family to have access to your record, you must provide written consent by completing a proxy form available at our office. Please speak to our clinical staff about guidelines regarding accounts for patients younger than age 46.  As you activate your MyChart  account and need any technical assistance, please call the MyChart technical support line at [phone]) or email your question to [email or web address]. If you email your question(s), please include your name, a return phone number and the best time to reach you.  If you have non-urgent health-related questions, you can send a message to our office through MyChart at [email or web address]. If you have a medical emergency, call 911.  Thank you for using MyChart as your new health and wellness resource!   MyChart licensed from [employer],  9562-1308. Patents Pending.

## 2012-08-09 ENCOUNTER — Encounter: Payer: BC Managed Care – PPO | Admitting: Internal Medicine

## 2013-04-03 ENCOUNTER — Ambulatory Visit (INDEPENDENT_AMBULATORY_CARE_PROVIDER_SITE_OTHER): Payer: BC Managed Care – PPO | Admitting: Internal Medicine

## 2013-04-03 ENCOUNTER — Encounter: Payer: Self-pay | Admitting: Internal Medicine

## 2013-04-03 VITALS — BP 114/80 | HR 58 | Temp 97.8°F | Wt 174.8 lb

## 2013-04-03 DIAGNOSIS — K219 Gastro-esophageal reflux disease without esophagitis: Secondary | ICD-10-CM

## 2013-04-03 DIAGNOSIS — J45909 Unspecified asthma, uncomplicated: Secondary | ICD-10-CM

## 2013-04-03 MED ORDER — ALBUTEROL SULFATE HFA 108 (90 BASE) MCG/ACT IN AERS: 2.0000 | INHALATION_SPRAY | Freq: Four times a day (QID) | RESPIRATORY_TRACT | Status: DC | PRN

## 2013-04-03 NOTE — Patient Instructions (Signed)
1. PUlmonary - clear lungs today. Chest tightness more likely related to reflux. No respiratory symptoms Plan Albuterol  MDI 2 puffs every 6 hours as needed or prior to exercise that causes wheezing  For use exceeding 3 times a day you must be seen.  Rule of "2's" if you need to use inhaler more than twice a week, get up at night wheezing more than twice a month or need medical care twice a year - you are not control.   2. GI - seems reflux is out of control, may be due to some of the cold medicines that could have effected the Lower esophageal sphincter Plan Continue dexilant  For breakthrough heartburn, chest tightness take a liquid antacid with anti-gasing agent, e.g. Mylanta II  For persistent symptoms beyond a week - see Dr. Ronnald Ramp.

## 2013-04-03 NOTE — Progress Notes (Signed)
Pre visit review using our clinic review tool, if applicable. No additional management support is needed unless otherwise documented below in the visit note. 

## 2013-04-03 NOTE — Progress Notes (Signed)
   Subjective:    Patient ID: George [name], male    DOB: October 10, 1965, 48 y.o.   MRN: [id number]  HPI Mr. Golda Acre presents for chest tightness, wheezing after having a cold. No productive cough. He has a h/o exercise induced asthma, primarily with running. He also will have increased wheezing when his heart burn is worse. He has been taking cold medications and has seen an increase in reflux and chest pressure  Past Medical History  Diagnosis Date  . GERD (gastroesophageal reflux disease)   . Fainting spell as child  . Asthma   . Anxiety    History reviewed. No pertinent past surgical history. Family History  Problem Relation Age of Onset  . Obesity Father   . Asthma Father   . Hypertension Father   . Bladder Cancer Father   . Diabetes Neg Hx   . Early death Neg Hx   . Heart disease Neg Hx   . Hyperlipidemia Neg Hx   . Kidney disease Neg Hx   . Stroke Neg Hx    History   Social History  . Marital Status: Married    Spouse Name: N/A    Number of Children: 2  . Years of Education: N/A   Occupational History  . Prof     [employer]  .     Social History Main Topics  . Smoking status: Never Smoker   . Smokeless tobacco: Never Used  . Alcohol Use: Yes  . Drug Use: No  . Sexual Activity: Yes   Other Topics Concern  . Not on file   Social History Narrative   Regular exercise-yes    Current Outpatient Prescriptions on File Prior to Visit  Medication Sig Dispense Refill  . budesonide-formoterol (SYMBICORT) 160-4.5 MCG/ACT inhaler Inhale 2 puffs into the lungs 2 (two) times daily.  1 Inhaler  11  . dexlansoprazole (DEXILANT) 60 MG capsule Take 1 capsule (60 mg total) by mouth daily.  90 capsule  3  . pseudoephedrine (SUDAFED) 30 MG tablet Take 30 mg by mouth.        . traZODone (DESYREL) 50 MG tablet Take 0.5-1 tablets (25-50 mg total) by mouth at bedtime as needed for sleep.  90 tablet  3   No current facility-administered medications on file prior to visit.         Review of Systems System review is negative for any constitutional, cardiac, pulmonary, GI or neuro symptoms or complaints other than as described in the HPI.     Objective:   Physical Exam Filed Vitals:   04/03/13 0839  BP: 114/80  Pulse: 58  Temp: 97.8 F (36.6 C)   gen'l- WNWD man in no distress Cor - RRR, no mm/r/g PUlm - crystal clear, no wheezing Abd - soft, BS+, tender in the epigastrium       Assessment & Plan:

## 2013-04-04 NOTE — Assessment & Plan Note (Addendum)
PUlmonary - clear lungs today. Chest tightness more likely related to reflux. No respiratory symptoms. Reviewed maintenance medications vs rescue medications. He denies having chronic symptoms Plan Albuterol  MDI 2 puffs every 6 hours as needed or prior to exercise that causes wheezing  For use exceeding 3 times a day you must be seen.  Rule of "2's" if you need to use inhaler more than twice a week, get up at night wheezing more than twice a month or need medical care twice a year - you are not controlled.

## 2013-04-04 NOTE — Assessment & Plan Note (Signed)
GERD- seems reflux is out of control, may be due to some of the cold medicines that could have effected the Lower esophageal sphincter Plan Continue dexilant  For breakthrough heartburn, chest tightness take a liquid antacid with anti-gasing agent, e.g. Mylanta II  For persistent symptoms beyond a week - see Dr. Ronnald Ramp

## 2013-04-25 ENCOUNTER — Ambulatory Visit (INDEPENDENT_AMBULATORY_CARE_PROVIDER_SITE_OTHER): Payer: BC Managed Care – PPO | Admitting: Family Medicine

## 2013-04-25 ENCOUNTER — Encounter: Payer: Self-pay | Admitting: Family Medicine

## 2013-04-25 VITALS — BP 128/70 | HR 59 | Temp 97.9°F | Resp 16

## 2013-04-25 DIAGNOSIS — S76311A Strain of muscle, fascia and tendon of the posterior muscle group at thigh level, right thigh, initial encounter: Secondary | ICD-10-CM | POA: Insufficient documentation

## 2013-04-25 DIAGNOSIS — IMO0002 Reserved for concepts with insufficient information to code with codable children: Secondary | ICD-10-CM

## 2013-04-25 NOTE — Progress Notes (Signed)
Pre-visit discussion using our clinic review tool. No additional management support is needed unless otherwise documented below in the visit note.  

## 2013-04-25 NOTE — Patient Instructions (Signed)
Very nice to meet you Hamstring tenodonpathy.  Vitamin D 2000 IU daily Turmeric 500mg  Twice daily Compression sleeves on thigh bilaterally. Wear with activity and 30 minutes afterward.  Ice 20 minutes after activity.  Easy exercises 4-5 times week Askling exercises as they state.  Come back again in 3 weeks.

## 2013-04-25 NOTE — Assessment & Plan Note (Signed)
Patient has a hamstring tendonopathy.  At ischium.  HEp given with askling.  Patient also will try over-the-counter medications for anti-inflammatories secondary to his history of gastric reflux disease Patient will try these interventions and come back in 2-3 weeks. At that time if he continues to have trouble her like ultrasound the area and consider about potential injection. We decided not to do it today secondary to our allergies as well as his wanted to continue to workout.

## 2013-04-25 NOTE — Progress Notes (Signed)
  [name] [address] [place], [place] [address] Phone: [phone] Subjective:    I'm seeing this patient by the request  of:  Scarlette Calico, MD   CC: hip issues  HYI:[id number] George [name] is a 48 y.o. male coming in with complaint of hip issues. Patient notices more the right side the left side. Patient states it's more the buttocks area it seems to hurt him when he is running. Patient states it is very sore with sitting for long amount of time as well. Patient does not recovery true injury and denies any swelling, any radiation of pain down the leg, or any numbness. Patient states that he is still working on a regular basis including swimming but does notice discomfort. This is been going on for months. Patient describes as more of a dull aching sensation. Patient puts the severity of 7/10.     Past medical history, social, surgical and family history all reviewed in electronic medical record.   Review of Systems: No headache, visual changes, nausea, vomiting, diarrhea, constipation, dizziness, abdominal pain, skin rash, fevers, chills, night sweats, weight loss, swollen lymph nodes, body aches, joint swelling, muscle aches, chest pain, shortness of breath, mood changes.   Objective Blood pressure 128/70, pulse 59, temperature 97.9 F (36.6 C), temperature source Oral, resp. rate 16, SpO2 99.00%.  General: No apparent distress alert and oriented x3 mood and affect normal, dressed appropriately.  HEENT: Pupils equal, extraocular movements intact  Respiratory: Patient's speak in full sentences and does not appear short of breath  Cardiovascular: No lower extremity edema, non tender, no erythema  Skin: Warm dry intact with no signs of infection or rash on extremities or on axial skeleton.  Abdomen: Soft nontender  Neuro: Cranial nerves II through XII are intact, neurovascularly intact in all extremities with 2+ DTRs and 2+ pulses.  Lymph: No  lymphadenopathy of posterior or anterior cervical chain or axillae bilaterally.  Gait normal with good balance and coordination.  MSK:  Non tender with full range of motion and good stability and symmetric strength and tone of shoulders, elbows, wrist, knee and ankles bilaterally.  Hip: ROM IR:33 Deg, ER: 45 Deg, Flexion: 120 Deg, Extension: 100 Deg, Abduction: 45 Deg, Adduction: 45 Deg Strength IR: 5/5, ER: 5/5, Flexion: 5/5, Extension: 5/5, Abduction: 5/5, Adduction: 5/5 Pelvic alignment unremarkable to inspection and palpation. Standing hip rotation and gait without trendelenburg sign / unsteadiness. Greater trochanter without tenderness to palpation. No tenderness over piriformis and greater trochanter. Mild pain with FABER but negative FADIR. No SI joint tenderness and normal minimal SI movement. Patient does have extreme tightness of the hamstrings bilaterally and is tender to palpation at the ischium. She does have good strength of the hamstrings bilaterally. Neurovascularly intact distally.    Impression and Recommendations:     This case required medical decision making of moderate complexity.

## 2013-05-14 ENCOUNTER — Encounter: Payer: Self-pay | Admitting: Internal Medicine

## 2013-05-15 ENCOUNTER — Ambulatory Visit (INDEPENDENT_AMBULATORY_CARE_PROVIDER_SITE_OTHER): Payer: BC Managed Care – PPO | Admitting: Family Medicine

## 2013-05-15 ENCOUNTER — Ambulatory Visit (INDEPENDENT_AMBULATORY_CARE_PROVIDER_SITE_OTHER): Payer: BC Managed Care – PPO | Admitting: Internal Medicine

## 2013-05-15 ENCOUNTER — Encounter: Payer: Self-pay | Admitting: Family Medicine

## 2013-05-15 ENCOUNTER — Ambulatory Visit (INDEPENDENT_AMBULATORY_CARE_PROVIDER_SITE_OTHER): Payer: BC Managed Care – PPO

## 2013-05-15 ENCOUNTER — Encounter: Payer: Self-pay | Admitting: Internal Medicine

## 2013-05-15 VITALS — BP 116/68 | HR 74 | Temp 97.8°F | Resp 16 | Wt 177.0 lb

## 2013-05-15 VITALS — BP 116/68 | HR 74 | Ht 68.0 in | Wt 177.0 lb

## 2013-05-15 DIAGNOSIS — K219 Gastro-esophageal reflux disease without esophagitis: Secondary | ICD-10-CM

## 2013-05-15 DIAGNOSIS — IMO0002 Reserved for concepts with insufficient information to code with codable children: Secondary | ICD-10-CM

## 2013-05-15 DIAGNOSIS — S76311A Strain of muscle, fascia and tendon of the posterior muscle group at thigh level, right thigh, initial encounter: Secondary | ICD-10-CM

## 2013-05-15 DIAGNOSIS — J45909 Unspecified asthma, uncomplicated: Secondary | ICD-10-CM

## 2013-05-15 DIAGNOSIS — R0789 Other chest pain: Secondary | ICD-10-CM

## 2013-05-15 MED ORDER — NITROGLYCERIN 0.2 MG/HR TD PT24: MEDICATED_PATCH | TRANSDERMAL | Status: DC

## 2013-05-15 NOTE — Patient Instructions (Signed)
We have sent the following medications to your pharmacy for you to pick up at your convenience: Dexilant 60 mg  You have been referred to Pulmonary and will see Dr. Elsworth Soho on 06/05/2013 @ 10:30 am. If you need to reschedule this appointment please call [phone] option #2  Once Dr. Garth Schlatter May schedule is open I will call you to schedule your EGD.

## 2013-05-15 NOTE — Progress Notes (Signed)
[name] [address] [place], [place] [address] Phone: [phone] Subjective:     CC: hip issues, hamstring injury  ELF:[id number] George [name] is a 48 y.o. male coming in with complaint of hip issues followup. Patient was seen previously and was diagnosed with a hamstring muscle strain. Patient has been doing home exercise program but did not get a compression sleeve. Patient has been swimming without any discomfort and able to do activities of daily living without any pain if he tries to run he has a sharp pain in the posterior aspect of his leg that seems to move all the way from his back sometimes to lateral aspect of his hip. Patient states most the patient's toes around the hamstring but has not noticed any discoloration or any bruising. Patient is very frustrated and would like to be better than he is now. She will likely remain on a regular basis.    Past medical history, social, surgical and family history all reviewed in electronic medical record.   Review of Systems: No headache, visual changes, nausea, vomiting, diarrhea, constipation, dizziness, abdominal pain, skin rash, fevers, chills, night sweats, weight loss, swollen lymph nodes, body aches, joint swelling, muscle aches, chest pain, shortness of breath, mood changes.   Objective Blood pressure 116/68, pulse 74, temperature 97.8 F (36.6 C), temperature source Oral, resp. rate 16, weight 177 lb (80.287 kg), SpO2 98.00%.  General: No apparent distress alert and oriented x3 mood and affect normal, dressed appropriately.  HEENT: Pupils equal, extraocular movements intact  Respiratory: Patient's speak in full sentences and does not appear short of breath  Cardiovascular: No lower extremity edema, non tender, no erythema  Skin: Warm dry intact with no signs of infection or rash on extremities or on axial skeleton.  Abdomen: Soft nontender  Neuro: Cranial nerves II through XII are intact,  neurovascularly intact in all extremities with 2+ DTRs and 2+ pulses.  Lymph: No lymphadenopathy of posterior or anterior cervical chain or axillae bilaterally.  Gait normal with good balance and coordination.  MSK:  Non tender with full range of motion and good stability and symmetric strength and tone of shoulders, elbows, wrist, knee and ankles bilaterally.  Hip: ROM IR:35 Deg, ER: 45 Deg, Flexion: 120 Deg, Extension: 100 Deg, Abduction: 45 Deg, Adduction: 45 Deg Strength IR: 5/5, ER: 5/5, Flexion: 5/5, Extension: 5/5, Abduction: 5/5, Adduction: 5/5 Pelvic alignment unremarkable to inspection and palpation. Standing hip rotation and gait without trendelenburg sign / unsteadiness. Greater trochanter without tenderness to palpation. No tenderness over piriformis and greater trochanter. Mild pain with FABER but negative FADIR. No SI joint tenderness and normal minimal SI movement. Patient does have extreme tightness of the hamstrings bilaterally and is tender to palpation at the ischium the left side. She does have good strength of the hamstrings bilaterally. Neurovascularly intact distally.  MSK US performed of: Left This study was ordered, performed, and interpreted by Charlann Boxer D.O.  Hip: Trochanteric bursa without swelling or effusion. Acetabular labrum visualized and without tears, displacement, or effusion in joint. Femoral neck appears unremarkable without increased power doppler signal along Cortex. Patient's hamstring though does have what appears to be a chronic tear with scar tissue formation approximately 2 cm in diameter with some calcification and increasing Doppler flow. This is approximately 7 cm from origin. Patient's ischial tuberosity does have findings of a bursitis.  IMPRESSION:  Chronic hamstring muscle tear improving slowly, ischial bursitis     Impression and Recommendations:  This case required medical decision making of moderate complexity.

## 2013-05-15 NOTE — Progress Notes (Signed)
Pre visit review using our clinic review tool, if applicable. No additional management support is needed unless otherwise documented below in the visit note. 

## 2013-05-15 NOTE — Progress Notes (Signed)
   Subjective:    Patient ID: George [name], male    DOB: 10-10-1965, 48 y.o.   MRN: [id number]  HPI George [name] is a 48 year old male with a past medical history of GERD, mild asthma who is seen in followup. He was initially seen in May 2014 to evaluate GERD an upper endoscopy was scheduled. He canceled this appointment an upper endoscopy was never performed. He was started on Dexilant 60 mg daily, which he stated worked very very well to control his heartburn. He had a viral gastroenteritis in January 2015 and was off his PPI for several days. During this time he had significant heartburn and some mild epigastric abdominal discomfort with eating. His viral illness resolved and he resumed a Dexilant 60 mg daily. Now he reports he is "reestablished equilibrium". Though he still feels some mild discomfort after eating intermittently near the xiphoid.  He denies dysphagia or odynophagia. Appetite has been good. No nausea or vomiting. He has noticed wheezing and mild chest tightness worse with cold weather and exercise. He was given an albuterol inhaler which he is using when necessary. He was under the impression he was not to use this more than twice weekly. He had been on Symbicort but this was stopped. He denies dyspnea at rest, chest pain, lower extremity edema.  Review of Systems As per history of present illness, otherwise negative  Current Medications, Allergies, Past Medical History, Past Surgical History, Family History and Social History were reviewed in [employer] record.     Objective:   Physical Exam BP 116/68  Pulse 74  Ht 5\' 8"  (1.727 m)  Wt 177 lb (80.287 kg)  BMI 26.92 kg/m2 Constitutional: Well-developed and well-nourished. No distress. HEENT: Normocephalic and atraumatic.No scleral icterus. Neck: Neck supple. Trachea midline. Cardiovascular: Normal rate, regular rhythm and intact distal pulses. No M/R/G Pulmonary/chest: Effort normal and breath sounds  normal. No wheezing, rales or rhonchi. Extremities: no clubbing, cyanosis, or edema Neurological: Alert and oriented to person place and time. Psychiatric: Normal mood and affect. Behavior is normal.     Assessment & Plan:  48 year old male with a past medical history of GERD, mild asthma who is seen in followup. He was initially seen in May 2014 to evaluate GERD an upper endoscopy was scheduled  1.  GERD -- he remains on Dexilant 60 mg which is controlling his symptoms for the most part. He has some epigastric discomfort and for this reason I recommended upper endoscopy. We discussed the test today including the risks and benefits and he is agreeable to proceed. 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 We will try to accommodate this request. If symptoms worsen or change I asked that he notify me and we would likely recommend sooner upper endoscopy.  For now we will continue Dexilant 60 mg day, 30 minutes before breakfast. GERD diet  2.  Chest tightness/wheezing/possible exercise-induced asthma -- we discussed this today and I have recommended formal asthma/reactive airway testing by pulmonology. He is in full agreement with this evaluation and he will be referred to pulmonology.  I advised that he can continue the albuterol as needed and it is okay to use this more than twice weekly if needed.

## 2013-05-15 NOTE — Patient Instructions (Signed)
Good to see you Ice still 20 minutes after activity.  Continue exercises as stated.  Resistance training 2 times a week. Look up 7 minute workout scientific workout.  Compression sleeve [email or web address] Nitroglycerin Protocol   Apply 1/4 nitroglycerin patch to affected area daily.  Change position of patch within the affected area every 24 hours.  You may experience a headache during the first 1-2 weeks of using the patch, these should subside.  If you experience headaches after beginning nitroglycerin patch treatment, you may take your preferred over the counter pain reliever.  Another side effect of the nitroglycerin patch is skin irritation or rash related to patch adhesive.  Please notify our office if you develop more severe headaches or rash, and stop the patch.  Tendon healing with nitroglycerin patch may require 12 to 24 weeks depending on the extent of injury.  Men should not use if taking Viagra, Cialis, or Levitra.   Do not use if you have migraines or rosacea.   Start a walk-run progression: - I would like you to do line drills (try to keep foot on a line when jogging). - Initially start one minute walking than one minute running for 20 mins in the first week,   then 25 mins during the second week, then 30 mins afterwards.  Once you have reached 30 mins: - Run 2 mins, then walk 1 min. -Then run 3 mins, and walk 1 min. -Then run 4 mins, and walk 1 min. -Then run 5 mins, and walk 1 min. -Slowly build up weekly to running 30 mins nonstop.  If painful at any of the steps, back up one step.  Come back in 3 weeks.

## 2013-05-15 NOTE — Assessment & Plan Note (Signed)
Ultrasound shows the patient does have a chronic hamstring tear and will start nitroglycerin patches. Warned of potential side effects. Home exercise program to do 3-4 times a week Discussed the importance of a compression sleeve with activity Please see patient instructions for further detail.Spent greater than 25 minutes with patient face-to-face and had greater than 50% of counseling including as described above in assessment and plan. We'll see patient back again in 3-4 weeks.

## 2013-05-22 ENCOUNTER — Telehealth: Payer: Self-pay | Admitting: Internal Medicine

## 2013-05-22 NOTE — Telephone Encounter (Signed)
Patient is scheduled for an EGD for 07/24/13 and pre-visit 06/05/13.  Patient would like to have PFT directly and not have a consult.  Discussed with Dr. Hilarie Fredrickson.  He wants pulmonary consult and let them decide what testing is done.   Left message for patient to call back

## 2013-05-23 ENCOUNTER — Encounter: Payer: Self-pay | Admitting: Pulmonary Disease

## 2013-05-23 NOTE — Telephone Encounter (Signed)
Patient notified

## 2013-05-23 NOTE — Telephone Encounter (Signed)
Left message for patient to call back  

## 2013-06-05 ENCOUNTER — Telehealth: Payer: Self-pay

## 2013-06-05 ENCOUNTER — Institutional Professional Consult (permissible substitution): Payer: BC Managed Care – PPO | Admitting: Pulmonary Disease

## 2013-06-05 ENCOUNTER — Ambulatory Visit (INDEPENDENT_AMBULATORY_CARE_PROVIDER_SITE_OTHER): Payer: BC Managed Care – PPO | Admitting: Family Medicine

## 2013-06-05 ENCOUNTER — Encounter: Payer: Self-pay | Admitting: Family Medicine

## 2013-06-05 ENCOUNTER — Ambulatory Visit (AMBULATORY_SURGERY_CENTER): Payer: Self-pay

## 2013-06-05 VITALS — BP 122/80 | HR 60

## 2013-06-05 VITALS — Ht 68.5 in | Wt 177.0 lb

## 2013-06-05 DIAGNOSIS — IMO0002 Reserved for concepts with insufficient information to code with codable children: Secondary | ICD-10-CM

## 2013-06-05 DIAGNOSIS — K219 Gastro-esophageal reflux disease without esophagitis: Secondary | ICD-10-CM

## 2013-06-05 DIAGNOSIS — S76311A Strain of muscle, fascia and tendon of the posterior muscle group at thigh level, right thigh, initial encounter: Secondary | ICD-10-CM

## 2013-06-05 NOTE — Assessment & Plan Note (Signed)
Patient is doing very well with improvement. Patient was given some strengthening exercises, nordic exercises which will be beneficial. Discuss continuing the compression and to increase his running progression more swiftly. Patient will try these interventions and if have any pain cancer to nitroglycerin. Patient and will come back again in 4-6 weeks for further evaluation. The patient is not perfect at that time I would like to do a repeat ultrasound to make sure healing has occurred and the ischial bursitis has resolved.

## 2013-06-05 NOTE — Progress Notes (Signed)
  [name] [address] [place], [place] [address] Phone: [phone] Subjective:     CC: hip issues, hamstring injury  [id number] George [name] is a 48 y.o. male coming in with complaint of hip issues followup. Patient was found to have actually a hamstring tear and was started on a nitroglycerin. Patient has been doing that with home exercises and did start a running progression. Patient states that he is approximately 70% better. Patient did not used to nitroglycerin because he is improving without it. Patient has been wearing a compression sleeve and is doing very well with running progression. Patient states he had can have some soreness when he is doing hiking more at night he feels some tightness but overall he has been improving. Patient has been doing the askling exercises. No new symptoms.    Past medical history, social, surgical and family history all reviewed in electronic medical record.   Review of Systems: No headache, visual changes, nausea, vomiting, diarrhea, constipation, dizziness, abdominal pain, skin rash, fevers, chills, night sweats, weight loss, swollen lymph nodes, body aches, joint swelling, muscle aches, chest pain, shortness of breath, mood changes.   Objective Blood pressure 122/80, pulse 60, SpO2 98.00%.  General: No apparent distress alert and oriented x3 mood and affect normal, dressed appropriately.  HEENT: Pupils equal, extraocular movements intact  Respiratory: Patient's speak in full sentences and does not appear short of breath  Cardiovascular: No lower extremity edema, non tender, no erythema  Skin: Warm dry intact with no signs of infection or rash on extremities or on axial skeleton.  Abdomen: Soft nontender  Neuro: Cranial nerves II through XII are intact, neurovascularly intact in all extremities with 2+ DTRs and 2+ pulses.  Lymph: No lymphadenopathy of posterior or anterior cervical chain or axillae bilaterally.    Gait normal with good balance and coordination.  MSK:  Non tender with full range of motion and good stability and symmetric strength and tone of shoulders, elbows, wrist, knee and ankles bilaterally.  Hip: Right ROM IR:35 Deg, ER: 45 Deg, Flexion: 120 Deg, Extension: 110 Deg, Abduction: 45 Deg, Adduction: 45 Deg Strength IR: 5/5, ER: 5/5, Flexion: 5/5, Extension: 5/5, Abduction: 5/5, Adduction: 5/5 Pelvic alignment unremarkable to inspection and palpation. Standing hip rotation and gait without trendelenburg sign / unsteadiness. Greater trochanter without tenderness to palpation. No tenderness over piriformis and greater trochanter. Mild pain with FABER but negative FADIR. No SI joint tenderness and normal minimal SI movement. Patient does have tightness of the hamstrings bilaterally but does have improvement with only lacking the last 15 of extension. Still right side is more tight than the left side Contralateral hip unremarkable   Impression and Recommendations:     This case required medical decision making of moderate complexity. Spent greater than 25 minutes with patient face-to-face and had greater than 50% of counseling including as described above in assessment and plan.

## 2013-06-05 NOTE — Telephone Encounter (Signed)
Called pt and left a message that per Dr Hilarie Fredrickson, he did not need a colonoscopy at this time. He could call our office  if he had further questions.

## 2013-06-05 NOTE — Telephone Encounter (Signed)
Left message for pt to callour office

## 2013-06-05 NOTE — Telephone Encounter (Signed)
Dr Hilarie Fredrickson, The pt is here today for his pre-visit prior to his endoscopy scheduled on 07/24/13.He just found out last week that there is family history of colon cancer with his maternal grandmother who died at age 47. He is not having any problems with his colon, but wanted to know if you feel it is necessary at this time to do a colonscopy with the  endoscopy. Please advise.

## 2013-06-05 NOTE — Patient Instructions (Signed)
Good to see you Continue compression Ice after activity Continue the medications.  3:1 running to walk for this week 5:1 following week and if not pain after that run as you feel fit Nordic exercises 20 degree bend and 3 sets of 15 3 times a week in 2 weeks.  Then increase bend 10 degrees a week if you feel fit Come back in 4-6 weeks and if still not perfect will discuss nitro and do another scan.

## 2013-06-05 NOTE — Telephone Encounter (Signed)
Guidelines support earlier screening in pts with 1st degree relative with colon cancer diagnosed before age 47. If his grandmother had colon cancer in her 75's and she is the only family member with hx of colon cancer, then guidelines suggest he should start screening at age 42 If he wishes to discuss this further with me please let me know

## 2013-06-11 ENCOUNTER — Encounter: Payer: Self-pay | Admitting: Internal Medicine

## 2013-06-13 ENCOUNTER — Institutional Professional Consult (permissible substitution): Payer: BC Managed Care – PPO | Admitting: Pulmonary Disease

## 2013-06-20 ENCOUNTER — Encounter: Payer: Self-pay | Admitting: Pulmonary Disease

## 2013-06-20 ENCOUNTER — Ambulatory Visit (INDEPENDENT_AMBULATORY_CARE_PROVIDER_SITE_OTHER)
Admission: RE | Admit: 2013-06-20 | Discharge: 2013-06-20 | Disposition: A | Discharge status: Home / Self Care | Payer: BC Managed Care – PPO | Source: Ambulatory Visit | Attending: Pulmonary Disease | Admitting: Pulmonary Disease

## 2013-06-20 ENCOUNTER — Ambulatory Visit (INDEPENDENT_AMBULATORY_CARE_PROVIDER_SITE_OTHER): Payer: BC Managed Care – PPO | Admitting: Pulmonary Disease

## 2013-06-20 VITALS — BP 124/62 | HR 65 | Temp 98.1°F | Ht 68.0 in | Wt 180.4 lb

## 2013-06-20 DIAGNOSIS — J45909 Unspecified asthma, uncomplicated: Secondary | ICD-10-CM

## 2013-06-20 NOTE — Patient Instructions (Signed)
Doubt you have asthma Airways may be reactive to reflux CXR today Lung function is slightly decreased in a 'restrictive' pattern Use albuterol as needed for wheezing No restriction to exercise

## 2013-06-20 NOTE — Assessment & Plan Note (Signed)
Spirometry does not show airway obstruction and is more indicative restriction, but he does have a bronchodilator response. This may indicate reactive airway disease, and would not label this is asthma-since this is of relatively recent onset. It is more likely that this is reactive to gastroesophageal reflux or some other trigger. As such I will not stepup therapy to steroids or LABA, but just use short-acting albuterol as needed for wheezing. He does not seem to be limited in his exercise capacity. I would focus on treatment of reflux as outlined by gastroenterology I have asked to revisit in 3-6 months.

## 2013-06-20 NOTE — Progress Notes (Signed)
Subjective:    Patient ID: George [name], male    DOB: [birth year], 48 y.o.   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 He has been diagnosed with gastroesophageal reflux on and off for 2 years, and is now maintained on dexilant with EGD planned next month.  He reports onset of. Heartburn about 2-3 years ago. About the same time he reports occasional wheezing that was related to cold weather or exercise. He has a strong family history of asthma in his dad and sister. He reports seasonal allergies in the spring and in cloudy weather that are relieved by occasional over-the-counter Sudafed. He used Symbicort given by his PCP in the past but this did not help. Albuterol does relieve the wheezing. He does not have significant dyspnea and is able to carry out his exercise regimen of running about 15 miles a week and swimming twice a week. He lives in a house built in the 20s, no change in his environment, para 2 cats at home less than 61-year-old. No occupational exposure.  He is somewhat symptomatic today and spirometry showed no evidence of airway obstruction with a ratio of 82, FEV1 of 2.99-78% and FVC of 3.66-78% suggestive of mild restriction. Post bronchodilator testing showed improvement in FEV1 to 3.16-83% and FVC 4.27-91%    Past Medical History  Diagnosis Date  . GERD (gastroesophageal reflux disease)   . Fainting spell as child  . Anxiety     Past Surgical History  Procedure Laterality Date  . No past surgeries      No Known Allergies  History   Social History  . Marital Status: Married    Spouse Name: N/A    Number of Children: 2  . Years of Education: N/A   Occupational History  . Prof     [employer]  .     Social History Main Topics  . Smoking status: Never Smoker   . Smokeless tobacco: Never Used  . Alcohol Use: Yes  . Drug Use: No  . Sexual Activity: Yes   Other Topics Concern  . Not on  file   Social History Narrative   Regular exercise-yes    Family History  Problem Relation Age of Onset  . Obesity Father   . Asthma Father   . Hypertension Father   . Bladder Cancer Father   . Diabetes Neg Hx   . Early death Neg Hx   . Heart disease Neg Hx   . Hyperlipidemia Neg Hx   . Kidney disease Neg Hx   . Stroke Neg Hx   . Colon cancer Maternal Grandmother      Review of Systems  Constitutional: Negative for fever and unexpected weight change.  HENT: Negative for congestion, dental problem, ear pain, nosebleeds, postnasal drip, rhinorrhea, sinus pressure, sneezing, sore throat and trouble swallowing.   Eyes: Negative for redness and itching.  Respiratory: Negative for cough, chest tightness, shortness of breath and wheezing.   Cardiovascular: Negative for palpitations and leg swelling.  Gastrointestinal: Negative for nausea and vomiting.  Genitourinary: Negative for dysuria.  Musculoskeletal: Negative for joint swelling.  Skin: Negative for rash.  Neurological: Negative for headaches.  Hematological: Does not bruise/bleed easily.  Psychiatric/Behavioral: Negative for dysphoric mood. The patient is nervous/anxious.        Objective:   Physical Exam  Gen. Pleasant, well-nourished, in no distress, normal affect ENT - no lesions, no post nasal drip, Class II airway  Neck: No JVD, no thyromegaly, no carotid bruits Lungs: no use of accessory muscles, no dullness to percussion, clear without rales or rhonchi  Cardiovascular: Rhythm regular, heart sounds  normal, no murmurs or gallops, no peripheral edema Abdomen: soft and non-tender, no hepatosplenomegaly, BS normal. Musculoskeletal: No deformities, no cyanosis or clubbing Neuro:  alert, non focal        Assessment & Plan:

## 2013-06-21 ENCOUNTER — Telehealth: Payer: Self-pay | Admitting: Pulmonary Disease

## 2013-06-21 NOTE — Telephone Encounter (Signed)
Notes Recorded by Rigoberto Noel, MD on 06/21/2013 at 2:01 AM normal  Spoke with the pt and notified of results per Dr Elsworth Soho He verbalized understanding and nothing further needed

## 2013-06-21 NOTE — Progress Notes (Signed)
Quick Note:  lmtcb ______ 

## 2013-06-21 NOTE — Progress Notes (Signed)
Quick Note:  Spoke with pt and notified of results per Dr. Alva Pt verbalized understanding and denied any questions.  ______ 

## 2013-06-29 ENCOUNTER — Other Ambulatory Visit: Payer: Self-pay | Admitting: Internal Medicine

## 2013-07-24 ENCOUNTER — Encounter: Payer: BC Managed Care – PPO | Admitting: Internal Medicine

## 2013-08-22 ENCOUNTER — Telehealth: Payer: Self-pay | Admitting: Internal Medicine

## 2013-08-22 NOTE — Telephone Encounter (Signed)
Patient is calling to request a new rx for Zolpidem 12.5mg . Explained to patient that he had not seen Dr. Ronnald Ramp since 05/2012 and would need OV. He says that he cannot come in for an OV anytime soon and asked that a message be sent to Dr. Ronnald Ramp regarding a refill. Please advise.

## 2013-08-23 ENCOUNTER — Encounter: Payer: Self-pay | Admitting: Internal Medicine

## 2013-08-23 ENCOUNTER — Ambulatory Visit (AMBULATORY_SURGERY_CENTER): Payer: BC Managed Care – PPO | Admitting: Internal Medicine

## 2013-08-23 VITALS — BP 104/77 | HR 56 | Temp 96.8°F | Resp 11 | Ht 68.0 in | Wt 177.0 lb

## 2013-08-23 DIAGNOSIS — K219 Gastro-esophageal reflux disease without esophagitis: Secondary | ICD-10-CM

## 2013-08-23 DIAGNOSIS — K5289 Other specified noninfective gastroenteritis and colitis: Secondary | ICD-10-CM

## 2013-08-23 MED ORDER — SODIUM CHLORIDE 0.9 % IV SOLN: 500.0000 mL | INTRAVENOUS | Status: DC

## 2013-08-23 NOTE — Progress Notes (Signed)
Called to room to assist during endoscopic procedure.  Patient ID and intended procedure confirmed with present staff. Received instructions for my participation in the procedure from the performing physician.  

## 2013-08-23 NOTE — Progress Notes (Signed)
Pt stable to RR 

## 2013-08-23 NOTE — Patient Instructions (Signed)
Discharge instructions given with verbal understanding. Biopsies taken. Resume previous medications. YOU HAD AN ENDOSCOPIC PROCEDURE TODAY AT THE [facility]: Refer to the procedure report that was given to you for any specific questions about what was found during the examination.  If the procedure report does not answer your questions, please call your gastroenterologist to clarify.  If you requested that your care partner not be given the details of your procedure findings, then the procedure report has been included in a sealed envelope for you to review at your convenience later.  YOU SHOULD EXPECT: Some feelings of bloating in the abdomen. Passage of more gas than usual.  Walking can help get rid of the air that was put into your GI tract during the procedure and reduce the bloating. If you had a lower endoscopy (such as a colonoscopy or flexible sigmoidoscopy) you may notice spotting of blood in your stool or on the toilet paper. If you underwent a bowel prep for your procedure, then you may not have a normal bowel movement for a few days.  DIET: Your first meal following the procedure should be a light meal and then it is ok to progress to your normal diet.  A half-sandwich or bowl of soup is an example of a good first meal.  Heavy or fried foods are harder to digest and may make you feel nauseous or bloated.  Likewise meals heavy in dairy and vegetables can cause extra gas to form and this can also increase the bloating.  Drink plenty of fluids but you should avoid alcoholic beverages for 24 hours.  ACTIVITY: Your care partner should take you home directly after the procedure.  You should plan to take it easy, moving slowly for the rest of the day.  You can resume normal activity the day after the procedure however you should NOT DRIVE or use heavy machinery for 24 hours (because of the sedation medicines used during the test).    SYMPTOMS TO REPORT IMMEDIATELY: A gastroenterologist  can be reached at any hour.  During normal business hours, 8:30 AM to 5:00 PM Monday through Friday, call [phone].  After hours and on weekends, please call the GI answering service at [phone] who will take a message and have the physician on call contact you.   Following upper endoscopy (EGD)  Vomiting of blood or coffee ground material  New chest pain or pain under the shoulder blades  Painful or persistently difficult swallowing  New shortness of breath  Fever of 100F or higher  Black, tarry-looking stools  FOLLOW UP: If any biopsies were taken you will be contacted by phone or by letter within the next 1-3 weeks.  Call your gastroenterologist if you have not heard about the biopsies in 3 weeks.  Our staff will call the home number listed on your records the next business day following your procedure to check on you and address any questions or concerns that you may have at that time regarding the information given to you following your procedure. This is a courtesy call and so if there is no answer at the home number and we have not heard from you through the emergency physician on call, we will assume that you have returned to your regular daily activities without incident.  SIGNATURES/CONFIDENTIALITY: You and/or your care partner have signed paperwork which will be entered into your electronic medical record.  These signatures attest to the fact that that the information above on your After   Visit Summary has been reviewed and is understood.  Full responsibility of the confidentiality of this discharge information lies with you and/or your care-partner. 

## 2013-08-23 NOTE — Op Note (Signed)
[facility]  [address] [place], [address]   ENDOSCOPY PROCEDURE REPORT  PATIENT: George [name], George [name]  MR#: [id number] BIRTHDATE: Jan 23, 1966 , 84  yrs. old GENDER: Male ENDOSCOPIST: Jerene Bears, MD PROCEDURE DATE:  08/23/2013 PROCEDURE:  EGD w/ biopsy ASA CLASS:     Class II INDICATIONS:  history of GERD.   Epigastric pain. MEDICATIONS: MAC sedation, administered by CRNA and propofol (Diprivan) 200mg  IV TOPICAL ANESTHETIC: none  DESCRIPTION OF PROCEDURE: After the risks benefits and alternatives of the procedure were thoroughly explained, informed consent was obtained.  The LB [id number] and LB [id number] endoscope was introduced through the mouth and advanced to the second portion of the duodenum. Without limitations.  The instrument was slowly withdrawn as the mucosa was fully examined.     ESOPHAGUS: A slightly irregular Z-line was observed 39 cm from the incisors. There were 2 very small patches of same and colored mucosa just above the Z line.  Multiple biopsies were performed using cold forceps.  Sample obtained to rule out Barrett's esophagus.   The esophagus was otherwise normal.  STOMACH: The mucosa of the stomach appeared normal.  DUODENUM: The duodenal mucosa showed no abnormalities in the bulb and second portion of the duodenum.  Retroflexed views revealed no abnormalities.     The scope was then withdrawn from the patient and the procedure completed.  COMPLICATIONS: There were no complications.  ENDOSCOPIC IMPRESSION: 1.   Irregular Z-line was observed 39 cm from the incisors; multiple biopsies 2.   The esophagus was otherwise normal. 3.   The mucosa of the stomach appeared normal 4.   The duodenal mucosa showed no abnormalities in the bulb and second portion of the duodenum  RECOMMENDATIONS: 1.  Await biopsy results 2.  Continue taking your PPI (antiacid medicine) once daily.  It is best to be taken 20-30 minutes prior to  breakfast meal.  eSigned:  Jerene Bears, MD 08/23/2013 11:12 AM         CC:The Patient and Janith Lima, MD

## 2013-08-23 NOTE — Progress Notes (Signed)
Made CRNA aware of pt. Stating that he took 2 sips of water this am @ 9,  Prior to procedure.

## 2013-08-24 ENCOUNTER — Telehealth: Payer: Self-pay | Admitting: *Deleted

## 2013-08-24 NOTE — Telephone Encounter (Signed)
  Follow up Call-  Call back number 08/23/2013  Post procedure Call Back phone  # [phone]  Permission to leave phone message Yes     Patient questions:  Do you have a fever, pain , or abdominal swelling? no Pain Score  0 *  Have you tolerated food without any problems? yes  Have you been able to return to your normal activities? yes  Do you have any questions about your discharge instructions: Diet   no Medications  no Follow up visit  no  Do you have questions or concerns about your Care? no  Actions: * If pain score is 4 or above: No action needed, pain <4.

## 2013-08-30 ENCOUNTER — Encounter: Payer: Self-pay | Admitting: Internal Medicine

## 2013-12-26 ENCOUNTER — Other Ambulatory Visit: Payer: Self-pay | Admitting: Internal Medicine

## 2013-12-31 ENCOUNTER — Encounter: Payer: Self-pay | Admitting: Internal Medicine

## 2013-12-31 ENCOUNTER — Ambulatory Visit (INDEPENDENT_AMBULATORY_CARE_PROVIDER_SITE_OTHER): Payer: BC Managed Care – PPO | Admitting: Internal Medicine

## 2013-12-31 VITALS — BP 110/68 | HR 61 | Temp 97.8°F | Resp 16 | Ht 68.0 in | Wt 178.0 lb

## 2013-12-31 DIAGNOSIS — E786 Lipoprotein deficiency: Secondary | ICD-10-CM

## 2013-12-31 DIAGNOSIS — K589 Irritable bowel syndrome without diarrhea: Secondary | ICD-10-CM

## 2013-12-31 DIAGNOSIS — K219 Gastro-esophageal reflux disease without esophagitis: Secondary | ICD-10-CM

## 2013-12-31 MED ORDER — DEXLANSOPRAZOLE 60 MG PO CPDR: 60.0000 mg | DELAYED_RELEASE_CAPSULE | Freq: Every day | ORAL | Status: DC

## 2013-12-31 NOTE — Assessment & Plan Note (Signed)
He is doing well on dexilant Will cont the same for now

## 2013-12-31 NOTE — Progress Notes (Signed)
Pre visit review using our clinic review tool, if applicable. No additional management support is needed unless otherwise documented below in the visit note. 

## 2013-12-31 NOTE — Progress Notes (Signed)
   Subjective:    Patient ID: George [name], male    DOB: Sep 30, 1965, 48 y.o.   MRN: [id number]  Gastrophageal Reflux He reports no abdominal pain, no belching, no chest pain, no choking, no coughing, no dysphagia, no early satiety, no globus sensation, no heartburn, no hoarse voice, no nausea, no sore throat, no stridor, no tooth decay, no water brash or no wheezing. This is a chronic problem. The current episode started more than 1 year ago. The problem occurs rarely. The problem has been resolved. Nothing aggravates the symptoms. Pertinent negatives include no anemia, fatigue, melena, muscle weakness, orthopnea or weight loss. There are no known risk factors. He has tried a PPI for the symptoms. The treatment provided significant relief.      Review of Systems  Constitutional: Negative for fever, weight loss, diaphoresis, fatigue and unexpected weight change.  HENT: Negative.  Negative for hoarse voice and sore throat.   Eyes: Negative.   Respiratory: Negative.  Negative for cough, choking and wheezing.   Cardiovascular: Negative.  Negative for chest pain, palpitations and leg swelling.  Gastrointestinal: Negative for heartburn, dysphagia, nausea, abdominal pain, constipation, blood in stool, melena and rectal pain.  Endocrine: Negative.   Musculoskeletal: Negative.  Negative for muscle weakness.  Skin: Negative.   Allergic/Immunologic: Negative.   Hematological: Negative.  Negative for adenopathy. Does not bruise/bleed easily.  Psychiatric/Behavioral: Negative.   All other systems reviewed and are negative.      Objective:   Physical Exam  Vitals reviewed. Constitutional: He is oriented to person, place, and time. He appears well-developed and well-nourished. No distress.  HENT:  Head: Normocephalic and atraumatic.  Mouth/Throat: Oropharynx is clear and moist. No oropharyngeal exudate.  Eyes: Conjunctivae are normal. Right eye exhibits no discharge. Left eye exhibits no discharge.  No scleral icterus.  Neck: Normal range of motion. Neck supple. No JVD present. No tracheal deviation present. No thyromegaly present.  Cardiovascular: Normal rate, regular rhythm, normal heart sounds and intact distal pulses.  Exam reveals no gallop and no friction rub.   No murmur heard. Pulmonary/Chest: Effort normal and breath sounds normal. No stridor. No respiratory distress. He has no wheezes. He has no rales. He exhibits no tenderness.  Abdominal: Soft. Bowel sounds are normal. He exhibits no distension and no mass. There is no tenderness. There is no rebound and no guarding.  Musculoskeletal: Normal range of motion. He exhibits no edema and no tenderness.  Lymphadenopathy:    He has no cervical adenopathy.  Neurological: He is oriented to person, place, and time.  Skin: Skin is warm and dry. No rash noted. He is not diaphoretic. No erythema. No pallor.          Assessment & Plan:

## 2014-02-14 ENCOUNTER — Encounter: Payer: BC Managed Care – PPO | Admitting: Internal Medicine

## 2014-02-28 ENCOUNTER — Encounter: Payer: BC Managed Care – PPO | Admitting: Internal Medicine

## 2014-04-05 ENCOUNTER — Encounter: Payer: BC Managed Care – PPO | Admitting: Internal Medicine

## 2014-04-23 ENCOUNTER — Encounter: Payer: Self-pay | Admitting: Internal Medicine

## 2014-04-23 ENCOUNTER — Other Ambulatory Visit (INDEPENDENT_AMBULATORY_CARE_PROVIDER_SITE_OTHER): Payer: BLUE CROSS/BLUE SHIELD

## 2014-04-23 ENCOUNTER — Ambulatory Visit (INDEPENDENT_AMBULATORY_CARE_PROVIDER_SITE_OTHER): Payer: BLUE CROSS/BLUE SHIELD | Admitting: Internal Medicine

## 2014-04-23 VITALS — BP 120/80 | HR 62 | Temp 97.8°F | Resp 16 | Ht 68.0 in | Wt 182.0 lb

## 2014-04-23 DIAGNOSIS — Z0000 Encounter for general adult medical examination without abnormal findings: Secondary | ICD-10-CM

## 2014-04-23 DIAGNOSIS — G4700 Insomnia, unspecified: Secondary | ICD-10-CM

## 2014-04-23 LAB — COMPREHENSIVE METABOLIC PANEL
ALT: 22 U/L (ref 0–53)
AST: 22 U/L (ref 0–37)
Albumin: 4.3 g/dL (ref 3.5–5.2)
Alkaline Phosphatase: 66 U/L (ref 39–117)
BUN: 23 mg/dL (ref 6–23)
CALCIUM: 9.2 mg/dL (ref 8.4–10.5)
CHLORIDE: 104 meq/L (ref 96–112)
CO2: 32 meq/L (ref 19–32)
CREATININE: 1.07 mg/dL (ref 0.40–1.50)
GFR: 78.4 mL/min (ref >60.00)
Glucose, Bld: 90 mg/dL (ref 70–99)
Potassium: 4 mEq/L (ref 3.5–5.1)
Sodium: 141 mEq/L (ref 135–145)
Total Bilirubin: 0.6 mg/dL (ref 0.2–1.2)
Total Protein: 7.2 g/dL (ref 6.0–8.3)

## 2014-04-23 LAB — LIPID PANEL
CHOLESTEROL: 196 mg/dL (ref 0–200)
HDL: 46 mg/dL (ref >39.00)
LDL Cholesterol: 126 mg/dL — ABNORMAL HIGH (ref 0–99)
NonHDL: 150
Total CHOL/HDL Ratio: 4
Triglycerides: 118 mg/dL (ref 0.0–149.0)
VLDL: 23.6 mg/dL (ref 0.0–40.0)

## 2014-04-23 LAB — CBC WITH DIFFERENTIAL/PLATELET
BASOS PCT: 0.4 % (ref 0.0–3.0)
Basophils Absolute: 0 10*3/uL (ref 0.0–0.1)
Eosinophils Absolute: 0.1 10*3/uL (ref 0.0–0.7)
Eosinophils Relative: 1.9 % (ref 0.0–5.0)
HCT: 44 % (ref 39.0–52.0)
Hemoglobin: 15.1 g/dL (ref 13.0–17.0)
LYMPHS PCT: 25 % (ref 12.0–46.0)
Lymphs Abs: 1.8 10*3/uL (ref 0.7–4.0)
MCHC: 34.4 g/dL (ref 30.0–36.0)
MCV: 90.4 fl (ref 78.0–100.0)
MONOS PCT: 7.7 % (ref 3.0–12.0)
Monocytes Absolute: 0.6 10*3/uL (ref 0.1–1.0)
NEUTROS ABS: 4.8 10*3/uL (ref 1.4–7.7)
Neutrophils Relative %: 65 % (ref 43.0–77.0)
Platelets: 234 10*3/uL (ref 150.0–400.0)
RBC: 4.87 Mil/uL (ref 4.22–5.81)
RDW: 12.8 % (ref 11.5–15.5)
WBC: 7.3 10*3/uL (ref 4.0–10.5)

## 2014-04-23 LAB — TSH: TSH: 2.32 u[IU]/mL (ref 0.35–4.50)

## 2014-04-23 MED ORDER — TRAZODONE HCL 50 MG PO TABS: 25.0000 mg | ORAL_TABLET | Freq: Every evening | ORAL | Status: DC | PRN

## 2014-04-23 NOTE — Patient Instructions (Signed)

## 2014-04-23 NOTE — Progress Notes (Signed)
Pre visit review using our clinic review tool, if applicable. No additional management support is needed unless otherwise documented below in the visit note. 

## 2014-04-24 ENCOUNTER — Encounter: Payer: Self-pay | Admitting: Internal Medicine

## 2014-04-24 NOTE — Progress Notes (Signed)
Subjective:    Patient ID: George [name], male    DOB: [birth year], 49 y.o.   MRN: [id number]  HPI  He returns for a physical and complains of insomnia (frequent awakenings with mental ruminations.) He has taken trazodone before with good results but he is out of this.  Review of Systems  Constitutional: Negative for fever, chills, diaphoresis, appetite change and fatigue.  HENT: Negative.   Eyes: Negative.   Respiratory: Negative.  Negative for cough, choking, chest tightness, shortness of breath and stridor.   Cardiovascular: Negative.  Negative for chest pain, palpitations and leg swelling.  Gastrointestinal: Negative.  Negative for nausea, abdominal pain, diarrhea and constipation.  Endocrine: Negative.   Genitourinary: Negative.   Musculoskeletal: Negative.   Skin: Negative.   Allergic/Immunologic: Negative.   Neurological: Negative.   Hematological: Negative.  Negative for adenopathy. Does not bruise/bleed easily.  Psychiatric/Behavioral: Positive for sleep disturbance. Negative for suicidal ideas, behavioral problems, dysphoric mood, decreased concentration and agitation. The patient is not nervous/anxious.        Objective:   Physical Exam  Constitutional: He is oriented to person, place, and time. He appears well-developed and well-nourished. No distress.  HENT:  Head: Normocephalic and atraumatic.  Mouth/Throat: Oropharynx is clear and moist. No oropharyngeal exudate.  Eyes: Conjunctivae are normal. Right eye exhibits no discharge. Left eye exhibits no discharge. No scleral icterus.  Neck: Normal range of motion. Neck supple. No JVD present. No tracheal deviation present. No thyromegaly present.  Cardiovascular: Normal rate, regular rhythm, normal heart sounds and intact distal pulses.  Exam reveals no gallop and no friction rub.   No murmur heard. Pulmonary/Chest: Effort normal and breath sounds normal. No stridor. No respiratory distress. He has no wheezes. He has no  rales. He exhibits no tenderness.  Abdominal: Soft. Bowel sounds are normal. He exhibits no distension and no mass. There is no tenderness. There is no rebound and no guarding. Hernia confirmed negative in the right inguinal area and confirmed negative in the left inguinal area.  Genitourinary: Testes normal and penis normal. Right testis shows no mass, no swelling and no tenderness. Right testis is descended. Left testis shows no mass, no swelling and no tenderness. Left testis is descended. No penile erythema or penile tenderness. No discharge found.  Musculoskeletal: Normal range of motion. He exhibits no edema or tenderness.  Lymphadenopathy:    He has no cervical adenopathy.       Right: No inguinal adenopathy present.       Left: No inguinal adenopathy present.  Neurological: He is oriented to person, place, and time.  Skin: Skin is warm and dry. No rash noted. He is not diaphoretic. No erythema. No pallor.  Psychiatric: He has a normal mood and affect. His behavior is normal. Judgment and thought content normal.  Vitals reviewed.    Lab Results  Component Value Date   WBC 7.3 04/23/2014   HGB 15.1 04/23/2014   HCT 44.0 04/23/2014   PLT 234.0 04/23/2014   GLUCOSE 90 04/23/2014   CHOL 196 04/23/2014   TRIG 118.0 04/23/2014   HDL 46.00 04/23/2014   LDLDIRECT 159.3 06/25/2011   LDLCALC 126* 04/23/2014   ALT 22 04/23/2014   AST 22 04/23/2014   NA 141 04/23/2014   K 4.0 04/23/2014   CL 104 04/23/2014   CREATININE 1.07 04/23/2014   BUN 23 04/23/2014   CO2 32 04/23/2014   TSH 2.32 04/23/2014   PSA 0.60 12/05/2007  Assessment & Plan:

## 2014-04-24 NOTE — Assessment & Plan Note (Signed)
Vaccines were reviewed and updated Exam done Labs reviewed (normal) Pt ed material was given

## 2014-05-10 ENCOUNTER — Other Ambulatory Visit: Payer: Self-pay

## 2014-05-10 DIAGNOSIS — K219 Gastro-esophageal reflux disease without esophagitis: Secondary | ICD-10-CM

## 2014-05-10 MED ORDER — DEXLANSOPRAZOLE 60 MG PO CPDR: 60.0000 mg | DELAYED_RELEASE_CAPSULE | Freq: Every day | ORAL | Status: DC

## 2014-09-17 ENCOUNTER — Other Ambulatory Visit: Payer: Self-pay | Admitting: Internal Medicine

## 2014-09-17 ENCOUNTER — Telehealth: Payer: Self-pay | Admitting: Internal Medicine

## 2014-09-17 DIAGNOSIS — K219 Gastro-esophageal reflux disease without esophagitis: Secondary | ICD-10-CM

## 2014-09-17 MED ORDER — RANITIDINE HCL 300 MG PO TABS: 300.0000 mg | ORAL_TABLET | Freq: Every day | ORAL | Status: DC

## 2014-09-17 NOTE — Telephone Encounter (Signed)
Will change to a generic He can drink alcohol with this

## 2014-09-17 NOTE — Telephone Encounter (Signed)
Please advise, thanks.

## 2014-09-17 NOTE — Telephone Encounter (Signed)
Pt called and wanted to know if there is a different med that he can take other then dexlansoprazole (DEXILANT) 60 MG capsule [[id number]], He said that they have double copay and it is 200.00 now.  Also also had some questions about drinking while taking these meds?

## 2014-09-17 NOTE — Telephone Encounter (Signed)
Left message advising of dr Ronnald Ramp note, and to call back if any questions

## 2014-09-19 NOTE — Telephone Encounter (Signed)
LMOVM advising   ranitidine (ZANTAC) 300 MG tablet

## 2014-09-19 NOTE — Telephone Encounter (Signed)
Pt called in and wanted to know which med was called in?  It look like Zantc to me and sent to mail order but he just wanted to be sure?  Can you call him when you get a chance ?     Best number [phone]

## 2014-10-21 ENCOUNTER — Telehealth: Payer: Self-pay | Admitting: Internal Medicine

## 2014-10-21 NOTE — Telephone Encounter (Signed)
I have scheduled patient for 23rd for a new issue.  He is requesting to come in on the 18th or 19th if anything opens.  Please advise.

## 2014-10-29 NOTE — Telephone Encounter (Signed)
lmovm for pt to return call.  

## 2014-10-29 NOTE — Telephone Encounter (Signed)
Spoke to pt, scheduled him for 8.18.16.

## 2014-10-31 ENCOUNTER — Ambulatory Visit (INDEPENDENT_AMBULATORY_CARE_PROVIDER_SITE_OTHER): Payer: BLUE CROSS/BLUE SHIELD | Admitting: Family Medicine

## 2014-10-31 ENCOUNTER — Encounter: Payer: Self-pay | Admitting: Family Medicine

## 2014-10-31 VITALS — BP 110/82 | HR 91 | Ht 68.0 in | Wt 179.0 lb

## 2014-10-31 DIAGNOSIS — M9902 Segmental and somatic dysfunction of thoracic region: Secondary | ICD-10-CM

## 2014-10-31 DIAGNOSIS — M9904 Segmental and somatic dysfunction of sacral region: Secondary | ICD-10-CM

## 2014-10-31 DIAGNOSIS — M545 Low back pain, unspecified: Secondary | ICD-10-CM | POA: Insufficient documentation

## 2014-10-31 DIAGNOSIS — M9903 Segmental and somatic dysfunction of lumbar region: Secondary | ICD-10-CM

## 2014-10-31 DIAGNOSIS — M999 Biomechanical lesion, unspecified: Secondary | ICD-10-CM

## 2014-10-31 NOTE — Progress Notes (Signed)
[name] [address], [place] [address] Phone: [phone] Subjective:     CC: Back discomfort  UKG:[id number] George [name] is a 49 y.o. male coming in with complaint of back discomfort patient was away all summer in [place]. Patient states when he went of the restarted having low back pain. Unfortunate seem to get worse as the more activity he did. States that it seemed to be mostly on the left side. Patient went and 2 in urgent care multiple times and then even saw chiropractor while there. Patient saw sports medicine specialist and was given medicine and this did not seem to help either. Patient states that the chiropractor was the most beneficial especially when she he returned back here. Patient has been doing it 3 times a week with some moderate benefit. Patient states that it is 80% better than the worse that it after been. Patient denies any radiation or weakness of the extremity. Rates that the discomfort now is 2 out of 10. States that he can do daily activities.    Past medical history, social, surgical and family history all reviewed in electronic medical record.   Review of Systems: No headache, visual changes, nausea, vomiting, diarrhea, constipation, dizziness, abdominal pain, skin rash, fevers, chills, night sweats, weight loss, swollen lymph nodes, body aches, joint swelling, muscle aches, chest pain, shortness of breath, mood changes.   Objective Blood pressure 110/82, pulse 91, height 5\' 8"  (1.727 m), weight 179 lb (81.194 kg), SpO2 96 %.  General: No apparent distress alert and oriented x3 mood and affect normal, dressed appropriately.  HEENT: Pupils equal, extraocular movements intact  Respiratory: Patient's speak in full sentences and does not appear short of breath  Cardiovascular: No lower extremity edema, non tender, no erythema  Skin: Warm dry intact with no signs of infection or rash on extremities or on axial skeleton.    Abdomen: Soft nontender  Neuro: Cranial nerves II through XII are intact, neurovascularly intact in all extremities with 2+ DTRs and 2+ pulses.  Lymph: No lymphadenopathy of posterior or anterior cervical chain or axillae bilaterally.  Gait normal with good balance and coordination.  MSK:  Non tender with full range of motion and good stability and symmetric strength and tone of shoulders, elbows, wrist, knee and ankles bilaterally.  Back Exam:  Inspection: Unremarkable  Motion: Flexion 35 deg, Extension 35 deg, Side Bending to 35 deg bilaterally,  Rotation to 35 deg bilaterally  SLR laying: Negative  XSLR laying: Negative  Palpable tenderness: Mild tenderness of the paraspinal musculature of the lumbar spine at the sacral junction. FABER: negative. Sensory change: Gross sensation intact to all lumbar and sacral dermatomes.  Reflexes: 2+ at both patellar tendons, 2+ at achilles tendons, Babinski's downgoing.  Strength at foot  Plantar-flexion: 5/5 Dorsi-flexion: 5/5 Eversion: 5/5 Inversion: 5/5  Leg strength  Quad: 4/5 Hamstring: 4/5 Hip flexor: 4/5 Hip abductors: 4/5 but symmetric Severe tightness of the hamstring bilaterally  Osteopathic findings C4 flexed rotated and side bent left T3 extended rotated and side bent right nailbed rib L4 flexed rotated inside that right Sacrum left on left  Procedure note 97110; 15 minutes spent for Therapeutic exercises as stated in above notes.  This included exercises focusing on stretching, strengthening, with significant focus on eccentric aspects.  Sacroiliac Joint Mobilization and Rehab 1. Work on pretzel stretching, shoulder back and leg draped in front. 3-5 sets, 30 sec.. 2. hip abductor rotations. standing, hip flexion and rotation outward  then inward. 3 sets, 15 reps. when can do comfortably, add ankle weights starting at 2 pounds.  3. cross over stretching - shoulder back to ground, same side leg crossover. 3-5 sets for 30 min..  4.  rolling up and back knees to chest and rocking. 5. sacral tilt - 5 sets, hold for 5-10 seconds   Proper technique shown and discussed handout in great detail with ATC.  All questions were discussed and answered.     Impression and Recommendations:     This case required medical decision making of moderate complexity. Spent greater than 25 minutes with patient face-to-face and had greater than 50% of counseling including as described above in assessment and plan.

## 2014-10-31 NOTE — Patient Instructions (Addendum)
Good to see you You will do good Ice 20 minutes 2 times daily. Usually after activity and before bed. Sacroiliac Joint Mobilization and Rehab 1. Work on pretzel stretching, shoulder back and leg draped in front. 3-5 sets, 30 sec.. 2. hip abductor rotations. standing, hip flexion and rotation outward then inward. 3 sets, 15 reps. when can do comfortably, add ankle weights starting at 2 pounds.  3. cross over stretching - shoulder back to ground, same side leg crossover. 3-5 sets for 30 min..  4. rolling up and back knees to chest and rocking. 5. sacral tilt - 5 sets, hold for 5-10 seconds Exercises on wall.  Heel and butt touching.  Raise leg 6 inches and hold 2 seconds.  Down slow for count of 4 seconds.  1 set of 30 reps daily on both sides.  Hamstrings stretching would be great Get back in yoga Duexis 1 pill 3 times a day for 3 days Physical therapy get me the number See me again in 3 weeks.

## 2014-10-31 NOTE — Progress Notes (Signed)
Pre visit review using our clinic review tool, if applicable. No additional management support is needed unless otherwise documented below in the visit note. 

## 2014-10-31 NOTE — Assessment & Plan Note (Signed)
Decision today to treat with OMT was based on Physical Exam  After verbal consent patient was treated with HVLA, ME techniques in thoracic, cervical, lumbar and sacral areas  Patient tolerated the procedure well with improvement in symptoms  Patient given exercises, stretches and lifestyle modifications  See medications in patient instructions if given  Patient will follow up in 3 weeks

## 2014-10-31 NOTE — Assessment & Plan Note (Signed)
Seems to be more muscular skeletal in nature. Patient will do more of the over-the-counter natural supple mentation's on patient given a three-day dose of anti-inflammatory suture try. We discussed icing regimen as well as patient work with Product/process development scientist to learn home exercises. Did respond well to osteopathic manipulation today. Urge hip abductor strengthening as well as stretching of the hamstrings. Patient will come back and see me again in 3 weeks for further evaluation and treatment. No imaging warranted at this time.

## 2014-11-05 ENCOUNTER — Ambulatory Visit: Payer: BLUE CROSS/BLUE SHIELD | Admitting: Family Medicine

## 2014-12-05 ENCOUNTER — Ambulatory Visit (INDEPENDENT_AMBULATORY_CARE_PROVIDER_SITE_OTHER)
Admission: RE | Admit: 2014-12-05 | Discharge: 2014-12-05 | Disposition: A | Discharge status: Home / Self Care | Payer: BLUE CROSS/BLUE SHIELD | Source: Ambulatory Visit | Attending: Internal Medicine | Admitting: Internal Medicine

## 2014-12-05 ENCOUNTER — Telehealth: Payer: Self-pay | Admitting: Internal Medicine

## 2014-12-05 ENCOUNTER — Encounter: Payer: Self-pay | Admitting: Internal Medicine

## 2014-12-05 ENCOUNTER — Ambulatory Visit (INDEPENDENT_AMBULATORY_CARE_PROVIDER_SITE_OTHER): Payer: BLUE CROSS/BLUE SHIELD | Admitting: Internal Medicine

## 2014-12-05 ENCOUNTER — Other Ambulatory Visit (INDEPENDENT_AMBULATORY_CARE_PROVIDER_SITE_OTHER): Payer: BLUE CROSS/BLUE SHIELD

## 2014-12-05 VITALS — BP 112/80 | HR 59 | Temp 97.8°F | Resp 14 | Wt 172.0 lb

## 2014-12-05 DIAGNOSIS — J4521 Mild intermittent asthma with (acute) exacerbation: Secondary | ICD-10-CM

## 2014-12-05 DIAGNOSIS — J069 Acute upper respiratory infection, unspecified: Secondary | ICD-10-CM

## 2014-12-05 DIAGNOSIS — Z23 Encounter for immunization: Secondary | ICD-10-CM | POA: Diagnosis not present

## 2014-12-05 LAB — CBC WITH DIFFERENTIAL/PLATELET
Basophils Absolute: 0 10*3/uL (ref 0.0–0.1)
Basophils Relative: 0.4 % (ref 0.0–3.0)
EOS PCT: 2.1 % (ref 0.0–5.0)
Eosinophils Absolute: 0.2 10*3/uL (ref 0.0–0.7)
HCT: 45.9 % (ref 39.0–52.0)
Hemoglobin: 15.6 g/dL (ref 13.0–17.0)
LYMPHS ABS: 2.3 10*3/uL (ref 0.7–4.0)
Lymphocytes Relative: 26.7 % (ref 12.0–46.0)
MCHC: 33.9 g/dL (ref 30.0–36.0)
MCV: 91.2 fl (ref 78.0–100.0)
MONOS PCT: 7.8 % (ref 3.0–12.0)
Monocytes Absolute: 0.7 10*3/uL (ref 0.1–1.0)
NEUTROS ABS: 5.6 10*3/uL (ref 1.4–7.7)
NEUTROS PCT: 63 % (ref 43.0–77.0)
PLATELETS: 275 10*3/uL (ref 150.0–400.0)
RBC: 5.03 Mil/uL (ref 4.22–5.81)
RDW: 13.2 % (ref 11.5–15.5)
WBC: 8.8 10*3/uL (ref 4.0–10.5)

## 2014-12-05 MED ORDER — PREDNISONE 10 MG PO TABS: ORAL_TABLET | ORAL | Status: DC

## 2014-12-05 MED ORDER — AMOXICILLIN 500 MG PO CAPS: 500.0000 mg | ORAL_CAPSULE | Freq: Three times a day (TID) | ORAL | Status: DC

## 2014-12-05 NOTE — Telephone Encounter (Signed)
Patient is requesting a refill on trazodone to be sent to [facility].

## 2014-12-05 NOTE — Progress Notes (Signed)
   Subjective:    Patient ID: George [name], male    DOB: 1965/07/02, 49 y.o.   MRN: [id number]  HPI He has had a complicated course of the last 4 weeks. Initially he had a "cold" with fever and arthralgias which lasted 3 days. This was followed by cough which lasted a week. The cough has been for the most part nonproductive but occasionally will produce yellow sputum. This was most notable 5-6 days ago and was associated with nasal purulence.  He got over the original illness ; but 2 weeks ago had a recurrence of almost the same symptoms. The arthralgias lasted 3 days. He had temperatures up to 100-101. The cough now has become dry. He does have history of asthma but has not been using his rescue inhaler  He does have associated fatigue. He has had some decrease in his appetite. The cough has been associated with chest tightness. He's also said some temporal and frontal headaches with the cough.  He's used Robitussin, decongestions, &  non-steroidal over-the-counter medications  Review of Systems He denies postnasal drainage, facial or frontal sinus pain persistently, shortness of breath, or audible wheezing.  He also denies dyspepsia, dysphagia, or abdominal pain. His GERD is well controlled with ranitidine.    Objective:   Physical Exam He is unshaven but he has shaved his head. There is erythema of the nasal mucosa, greater on the right than the left. He has low-grade inspiratory pops/wheezes at the lower lobes. Overall breath sounds are decreased.  General appearance:Adequately nourished; no acute distress or increased work of breathing is present.    Lymphatic: No  lymphadenopathy about the head, neck, or axilla .  Eyes: No conjunctival inflammation or lid edema is present. There is no scleral icterus.  Ears:  External ear exam shows no significant lesions or deformities.  Otoscopic examination reveals clear canals, tympanic membranes are intact bilaterally without bulging, retraction,  inflammation or discharge.  Nose:  External nasal examination shows no deformity or inflammation. No septal dislocation or deviation.No obstruction to airflow.   Oral exam: Dental hygiene is good; lips and gums are healthy appearing.There is no oropharyngeal erythema or exudate .  Neck:  No deformities, thyromegaly, masses, or tenderness noted.   Supple with full range of motion without pain.   Heart:  Normal rate and regular rhythm. S1 and S2 normal without gallop, murmur, click, rub or other extra sounds.   Extremities:  No cyanosis, edema, or clubbing  noted   Skin: Warm & dry w/o tenting or jaundice. No significant lesions or rash.      Assessment & Plan:  #1 asthmatic bronchitis  #2 upper respiratory tract infection  Plan: See orders and recommendations

## 2014-12-05 NOTE — Progress Notes (Signed)
Pre visit review using our clinic review tool, if applicable. No additional management support is needed unless otherwise documented below in the visit note. 

## 2014-12-05 NOTE — Patient Instructions (Signed)
Plain Mucinex (NOT D) for thick secretions ;force NON dairy fluids .   Nasal cleansing in the shower as discussed with lather of mild shampoo.After 10 seconds wash off lather while  exhaling through nostrils. Make sure that all residual soap is removed to prevent irritation.  Flonase OR Nasacort AQ 1 spray in each nostril twice a day as needed. Use the "crossover" technique into opposite nostril spraying toward opposite ear @ 45 degree angle, not straight up into nostril.  Plain Allegra (NOT D )  160 daily , Loratidine 10 mg , OR Zyrtec 10 mg @ bedtime  as needed for itchy eyes & sneezing.   Symbicort one - two inhalations every 12 hours; gargle and spit after use   Albuterol is a rescue inhaler which should be used  1-2 puffs every 4 hours as needed.Marland Kitchen

## 2014-12-06 MED ORDER — AMOXICILLIN 500 MG PO CAPS: 500.0000 mg | ORAL_CAPSULE | Freq: Three times a day (TID) | ORAL | Status: DC

## 2014-12-06 MED ORDER — PREDNISONE 10 MG PO TABS: ORAL_TABLET | ORAL | Status: DC

## 2014-12-31 ENCOUNTER — Encounter: Payer: Self-pay | Admitting: Internal Medicine

## 2014-12-31 ENCOUNTER — Ambulatory Visit (INDEPENDENT_AMBULATORY_CARE_PROVIDER_SITE_OTHER): Payer: BLUE CROSS/BLUE SHIELD | Admitting: Internal Medicine

## 2014-12-31 VITALS — BP 112/78 | HR 68 | Temp 98.0°F | Ht 68.0 in | Wt 175.2 lb

## 2014-12-31 DIAGNOSIS — J4520 Mild intermittent asthma, uncomplicated: Secondary | ICD-10-CM | POA: Diagnosis not present

## 2014-12-31 DIAGNOSIS — R05 Cough: Secondary | ICD-10-CM

## 2014-12-31 DIAGNOSIS — R059 Cough, unspecified: Secondary | ICD-10-CM

## 2014-12-31 MED ORDER — MONTELUKAST SODIUM 10 MG PO TABS: 10.0000 mg | ORAL_TABLET | Freq: Every day | ORAL | Status: DC

## 2014-12-31 NOTE — Progress Notes (Signed)
   Subjective:    Patient ID: George [name], male    DOB: 26-Jun-1965, 49 y.o.   MRN: [id number]  HPI He was much improved with the amoxicillin and prednisone; but now symptoms have recurred. He describes a cough productive of clear sputum. The cough is worse at night. Albuterol definitely helps the cough  He's had some facial pain and frontal headaches without purulent nasal secretions.  One day last week he did have some arthralgias.  He's had increased fatigue and decreased appetite.  He's had these type symptoms seemingly since he's been [place] in the last 10-12 years. He was told he had exercise-induced bronchospasm. His father & sister have asthma.  Review of Systems  He denies sore throat, otic pain, itchy, watery eyes, fever, or shortness of breath.  He does have reflux symptoms. This was significantly better on Dexilant. He's now on ranitidine 300 mg daily with less response.     Objective:   Physical Exam  There is wax in the right otic canal. He has  erythema of the right nasal mucosa greater than left.  General appearance:Adequately nourished; no acute distress or increased work of breathing is present.    Lymphatic: No  lymphadenopathy about the head, neck, or axilla .  Eyes: No conjunctival inflammation or lid edema is present. There is no scleral icterus.  Ears:  External ear exam shows no significant lesions or deformities.  Otoscopic examination reveals clear canals, tympanic membranes are intact bilaterally without bulging, retraction, inflammation or discharge.  Nose:  External nasal examination shows no deformity or inflammation. No septal dislocation or deviation.No obstruction to airflow.   Oral exam: Dental hygiene is good; lips and gums are healthy appearing.There is no oropharyngeal erythema or exudate .  Neck:  No deformities, thyromegaly, masses, or tenderness noted.   Supple with full range of motion without pain.   Heart:  Normal rate and  regular rhythm. S1 and S2 normal without gallop, murmur, click, rub or other extra sounds.   Lungs:Chest clear to auscultation; no wheezes, rhonchi,rales ,or rubs present.  Extremities:  No cyanosis, edema, or clubbing  noted    Skin: Warm & dry w/o tenting or jaundice. No significant lesions or rash.     Assessment & Plan:  #1 asthma, mild intermittent #2 GERD See orders

## 2014-12-31 NOTE — Patient Instructions (Signed)
Breo one inhalations daily; gargle and spit after use. Nasal cleansing in the shower as discussed with lather of mild shampoo.After 10 seconds wash off lather while  exhaling through nostrils. Make sure that all residual soap is removed to prevent irritation.  Flonase OR Nasacort AQ 1 spray in each nostril twice a day as needed. Use the "crossover" technique into opposite nostril spraying toward opposite ear @ 45 degree angle, not straight up into nostril.  Plain Allegra (NOT D )  160 daily , Loratidine 10 mg , OR Zyrtec 10 mg @ bedtime  as needed for itchy eyes & sneezing.Reflux of gastric acid may be asymptomatic as this may occur mainly during sleep.The triggers for reflux  include stress; the "aspirin family" ; alcohol; peppermint; and caffeine (coffee, tea, cola, and chocolate). The aspirin family would include aspirin and the nonsteroidal agents such as ibuprofen &  Naproxen. Tylenol would not cause reflux. If having symptoms ; food & drink should be avoided for @ least 2 hours before going to bed.

## 2014-12-31 NOTE — Progress Notes (Signed)
Pre visit review using our clinic review tool, if applicable. No additional management support is needed unless otherwise documented below in the visit note. 

## 2015-01-01 ENCOUNTER — Telehealth: Payer: Self-pay | Admitting: Internal Medicine

## 2015-01-01 NOTE — Telephone Encounter (Signed)
patient states that dexilant has been working really well for him but the cost of this medication has doubled. Patient states that he has tried rantinidine(but he says that medication didn't work). he is wanting to know if we can call his insurance to see if there is anything that we can do to keep his cost down or if there is an alternative to this medication that would be less expensive.

## 2015-01-01 NOTE — Telephone Encounter (Signed)
I have spoken to patient to ask that he contact his insurance company regarding PPI. I have also sent a mychart message as per patient request.

## 2015-01-02 NOTE — Telephone Encounter (Signed)
Dr Hilarie Fredrickson- Please see mychart correspondence. Patient's insurance seems to prefer lansoprazole. May I fill this instead of Dexilant? Does patient need office visit now or is he okay to wait another year?

## 2015-01-06 MED ORDER — LANSOPRAZOLE 30 MG PO CPDR: 30.0000 mg | DELAYED_RELEASE_CAPSULE | Freq: Every day | ORAL | Status: DC

## 2015-01-06 NOTE — Telephone Encounter (Signed)
Rx sent and left message advising patient of info below.

## 2015-01-06 NOTE — Telephone Encounter (Signed)
He can try lansoprazole 30 mg daily before breakfast, he may need a additional dose in the evening given that Dexilant is slightly longer acting Have him let me know if this is not working

## 2015-01-06 NOTE — Addendum Note (Signed)
Addended by: Larina Bras on: 01/06/2015 08:36 AM   Modules accepted: Orders

## 2015-02-13 ENCOUNTER — Other Ambulatory Visit: Payer: Self-pay | Admitting: Internal Medicine

## 2015-02-13 DIAGNOSIS — G4700 Insomnia, unspecified: Secondary | ICD-10-CM

## 2015-02-13 MED ORDER — TRAZODONE HCL 50 MG PO TABS: 25.0000 mg | ORAL_TABLET | Freq: Every evening | ORAL | Status: DC | PRN

## 2015-02-13 NOTE — Telephone Encounter (Signed)
Ok to rf? 

## 2015-02-13 NOTE — Telephone Encounter (Signed)
[  patient is requesting a refill of traZODone (DESYREL) 50 MG tablet [id number] sent via [facility].

## 2015-02-18 ENCOUNTER — Ambulatory Visit (INDEPENDENT_AMBULATORY_CARE_PROVIDER_SITE_OTHER): Payer: BLUE CROSS/BLUE SHIELD | Admitting: Family Medicine

## 2015-02-18 ENCOUNTER — Encounter: Payer: Self-pay | Admitting: Family Medicine

## 2015-02-18 VITALS — BP 112/76 | HR 55 | Ht 68.0 in | Wt 175.0 lb

## 2015-02-18 DIAGNOSIS — M9904 Segmental and somatic dysfunction of sacral region: Secondary | ICD-10-CM | POA: Diagnosis not present

## 2015-02-18 DIAGNOSIS — M9903 Segmental and somatic dysfunction of lumbar region: Secondary | ICD-10-CM

## 2015-02-18 DIAGNOSIS — M545 Low back pain, unspecified: Secondary | ICD-10-CM

## 2015-02-18 DIAGNOSIS — M9902 Segmental and somatic dysfunction of thoracic region: Secondary | ICD-10-CM | POA: Diagnosis not present

## 2015-02-18 DIAGNOSIS — M999 Biomechanical lesion, unspecified: Secondary | ICD-10-CM

## 2015-02-18 MED ORDER — CYCLOBENZAPRINE HCL 10 MG PO TABS: 10.0000 mg | ORAL_TABLET | Freq: Three times a day (TID) | ORAL | Status: DC | PRN

## 2015-02-18 NOTE — Progress Notes (Signed)
  [name] [address], [place] [address] Phone: [phone] Subjective:     CC: Back and neck discomfort follow up  [id number] George [name] is a 49 y.o. male coming in with complaint of back discomfort.  She was seen previously and had responded very well to postural changes, as well as osteopathic manipulation. Patient has not been seen for 4 months. Patient states he is been doing relatively well. Has been doing some Christmas decorations and feeling some tightness. Denies any numbness or tingling. Denies any new symptoms. Just some mild worsening of previous symptoms. Did have a recent upper respiratory infection with coughing than they have cause some more back pain.    Past medical history, social, surgical and family history all reviewed in electronic medical record.   Review of Systems: No headache, visual changes, nausea, vomiting, diarrhea, constipation, dizziness, abdominal pain, skin rash, fevers, chills, night sweats, weight loss, swollen lymph nodes, body aches, joint swelling, muscle aches, chest pain, shortness of breath, mood changes.   Objective Blood pressure 112/76, pulse 55, height 5\' 8"  (1.727 m), weight 175 lb (79.379 kg), SpO2 98 %.  General: No apparent distress alert and oriented x3 mood and affect normal, dressed appropriately.  HEENT: Pupils equal, extraocular movements intact  Respiratory: Patient's speak in full sentences and does not appear short of breath  Cardiovascular: No lower extremity edema, non tender, no erythema  Skin: Warm dry intact with no signs of infection or rash on extremities or on axial skeleton.  Abdomen: Soft nontender  Neuro: Cranial nerves II through XII are intact, neurovascularly intact in all extremities with 2+ DTRs and 2+ pulses.  Lymph: No lymphadenopathy of posterior or anterior cervical chain or axillae bilaterally.  Gait normal with good balance and coordination.  MSK:  Non tender  with full range of motion and good stability and symmetric strength and tone of shoulders, elbows, wrist, knee and ankles bilaterally.  Back Exam:  Inspection: Unremarkable  Motion: Flexion 35 deg, Extension 35 deg, Side Bending to 35 deg bilaterally,  Rotation to 35 deg bilaterally  SLR laying: Negative  XSLR laying: Negative  Palpable tenderness: Mild tenderness of the paraspinal musculature of the lumbar spine at the sacral junction. FABER: negative. Sensory change: Gross sensation intact to all lumbar and sacral dermatomes.  Reflexes: 2+ at both patellar tendons, 2+ at achilles tendons, Babinski's downgoing.  Strength at foot  Plantar-flexion: 5/5 Dorsi-flexion: 5/5 Eversion: 5/5 Inversion: 5/5  Leg strength  Quad: 4/5 Hamstring: 4/5 Hip flexor: 4/5 Hip abductors: 4/5 but symmetric Moderate improvement of the tightness of the hamstrings bilaterally  Osteopathic findings C4 flexed rotated and side bent left T3 extended rotated and side bent right  T7 extended rotated and side bent left L3 flexed rotated inside that right Sacrum left on left      Impression and Recommendations:     This case required medical decision making of moderate complexity.

## 2015-02-18 NOTE — Progress Notes (Signed)
Pre visit review using our clinic review tool, if applicable. No additional management support is needed unless otherwise documented below in the visit note. 

## 2015-02-18 NOTE — Assessment & Plan Note (Signed)
Seems to be more secondary to inactivity as well as poor posture. We discussed ergonomics at work that could be beneficial. 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 Patient is responding well to osteopathic manipulation. Patient will come back and see me again in 3-4 months as long as he continues to do well. Encourage him to continue the vitamin supplementation.

## 2015-02-18 NOTE — Patient Instructions (Signed)
Good to see you Happy holidays!  Duexis 3 times daily for 3 days if worsening.  Flexeril at night if needed Continue what you are doing including the vitamins See me again in 3-4 months.

## 2015-02-18 NOTE — Assessment & Plan Note (Signed)
Decision today to treat with OMT was based on Physical Exam  After verbal consent patient was treated with HVLA, ME techniques in thoracic, cervical, lumbar and sacral areas  Patient tolerated the procedure well with improvement in symptoms  Patient given exercises, stretches and lifestyle modifications  See medications in patient instructions if given  Patient will follow up in 3-4 months

## 2015-04-16 ENCOUNTER — Telehealth: Payer: Self-pay | Admitting: Internal Medicine

## 2015-04-16 MED ORDER — LANSOPRAZOLE 30 MG PO CPDR: 30.0000 mg | DELAYED_RELEASE_CAPSULE | Freq: Every day | ORAL | Status: DC

## 2015-04-16 NOTE — Telephone Encounter (Signed)
Rx sent 

## 2015-04-23 ENCOUNTER — Encounter: Payer: Self-pay | Admitting: Internal Medicine

## 2015-04-23 ENCOUNTER — Ambulatory Visit (INDEPENDENT_AMBULATORY_CARE_PROVIDER_SITE_OTHER): Payer: BLUE CROSS/BLUE SHIELD | Admitting: Internal Medicine

## 2015-04-23 DIAGNOSIS — J4530 Mild persistent asthma, uncomplicated: Secondary | ICD-10-CM | POA: Diagnosis not present

## 2015-04-23 DIAGNOSIS — J988 Other specified respiratory disorders: Secondary | ICD-10-CM | POA: Diagnosis not present

## 2015-04-23 DIAGNOSIS — J301 Allergic rhinitis due to pollen: Secondary | ICD-10-CM | POA: Diagnosis not present

## 2015-04-23 DIAGNOSIS — J22 Unspecified acute lower respiratory infection: Secondary | ICD-10-CM | POA: Insufficient documentation

## 2015-04-23 MED ORDER — FLUTICASONE PROPIONATE 50 MCG/ACT NA SUSP
2.0000 | Freq: Every day | NASAL | Status: AC
Start: 2015-04-23 — End: ?

## 2015-04-23 MED ORDER — METHYLPREDNISOLONE ACETATE 80 MG/ML IJ SUSP: 120.0000 mg | Freq: Once | INTRAMUSCULAR | Status: DC

## 2015-04-23 MED ORDER — MOXIFLOXACIN HCL 400 MG PO TABS: 400.0000 mg | ORAL_TABLET | Freq: Every day | ORAL | Status: AC

## 2015-04-23 MED ORDER — FLUTICASONE FUROATE-VILANTEROL 200-25 MCG/INH IN AEPB: 1.0000 | INHALATION_SPRAY | Freq: Every day | RESPIRATORY_TRACT | Status: DC

## 2015-04-23 MED ORDER — HYDROCOD POLST-CPM POLST ER 10-8 MG/5ML PO SUER: 5.0000 mL | Freq: Two times a day (BID) | ORAL | Status: DC | PRN

## 2015-04-23 NOTE — Progress Notes (Signed)
Subjective:  Patient ID: George [name], male    DOB: 1966/03/12  Age: 50 y.o. MRN: [id number]  CC: Cough   HPI George [name] presents for a 4 day history of deep cough productive of yellow phlegm with chills but no fever. He also complains of mild wheezing. He has tried Sudafed without much symptom relief. He has a history of nasal allergies and he tells me that his father is a pediatrician and gave him a prescription for fluticasone and he says that it has helped his nasal symptoms quite a bit.  Outpatient Prescriptions Prior to Visit  Medication Sig Dispense Refill  . albuterol (PROVENTIL HFA;VENTOLIN HFA) 108 (90 BASE) MCG/ACT inhaler Inhale 2 puffs into the lungs every 6 (six) hours as needed for wheezing or shortness of breath. 1 Inhaler 2  . cholecalciferol (VITAMIN D) 1000 UNITS tablet Take 1,000 Units by mouth daily. Take 2 pills daily    . lansoprazole (PREVACID) 30 MG capsule Take 1 capsule (30 mg total) by mouth daily before breakfast. 30 capsule 1  . montelukast (SINGULAIR) 10 MG tablet Take 1 tablet (10 mg total) by mouth at bedtime. 30 tablet 3  . traZODone (DESYREL) 50 MG tablet Take 0.5-1 tablets (25-50 mg total) by mouth at bedtime as needed for sleep. 90 tablet 3  . TURMERIC PO Take by mouth 2 (two) times daily.    . cyclobenzaprine (FLEXERIL) 10 MG tablet Take 1 tablet (10 mg total) by mouth 3 (three) times daily as needed for muscle spasms. (Patient not taking: Reported on 04/23/2015) 30 tablet 0  . ranitidine (ZANTAC) 300 MG tablet Take 1 tablet (300 mg total) by mouth at bedtime. 90 tablet 3   No facility-administered medications prior to visit.    ROS Review of Systems  Constitutional: Negative.  Negative for fever, chills, diaphoresis, appetite change and fatigue.  HENT: Positive for postnasal drip and rhinorrhea. Negative for congestion, facial swelling, sinus pressure, sneezing, sore throat, trouble swallowing and voice change.   Eyes: Negative.   Respiratory:  Positive for cough and wheezing. Negative for apnea, choking, chest tightness, shortness of breath and stridor.   Cardiovascular: Negative.  Negative for chest pain, palpitations and leg swelling.  Gastrointestinal: Negative.  Negative for nausea, vomiting, abdominal pain, diarrhea, constipation and blood in stool.  Endocrine: Negative.   Genitourinary: Negative.   Musculoskeletal: Negative.   Skin: Negative.  Negative for color change and rash.  Allergic/Immunologic: Negative.   Neurological: Negative.   Hematological: Negative.  Negative for adenopathy. Does not bruise/bleed easily.  Psychiatric/Behavioral: Negative.     Objective:  BP 118/78 mmHg  Pulse 70  Temp(Src) 98.4 F (36.9 C) (Oral)  Resp 16  Ht 5\' 8"  (1.727 m)  Wt 182 lb (82.555 kg)  BMI 27.68 kg/m2  SpO2 96%  BP Readings from Last 3 Encounters:  04/24/15 118/78  04/23/15 118/78  02/18/15 112/76    Wt Readings from Last 3 Encounters:  04/24/15 183 lb (83.008 kg)  04/23/15 182 lb (82.555 kg)  02/18/15 175 lb (79.379 kg)    Physical Exam  Constitutional: He is oriented to person, place, and time.  Non-toxic appearance. He does not have a sickly appearance. He does not appear ill. No distress.  HENT:  Head: Normocephalic and atraumatic.  Nose: No mucosal edema, rhinorrhea or sinus tenderness. Right sinus exhibits no maxillary sinus tenderness and no frontal sinus tenderness. Left sinus exhibits no maxillary sinus tenderness and no frontal sinus tenderness.  Mouth/Throat: Oropharynx is clear and  moist and mucous membranes are normal. Mucous membranes are not dry and not cyanotic. No trismus in the jaw. No uvula swelling. No oropharyngeal exudate, posterior oropharyngeal edema, posterior oropharyngeal erythema or tonsillar abscesses.  Eyes: Conjunctivae are normal. Right eye exhibits no discharge. Left eye exhibits no discharge. No scleral icterus.  Neck: Normal range of motion. Neck supple. No JVD present. No  tracheal deviation present. No thyromegaly present.  Cardiovascular: Normal rate, regular rhythm, normal heart sounds and intact distal pulses.  Exam reveals no gallop and no friction rub.   No murmur heard. Pulmonary/Chest: Effort normal and breath sounds normal. No stridor. No respiratory distress. He has no wheezes. He has no rales. He exhibits no tenderness.  Abdominal: Soft. Bowel sounds are normal. He exhibits no distension. There is no tenderness. There is no rebound and no guarding.  Musculoskeletal: Normal range of motion. He exhibits no edema or tenderness.  Lymphadenopathy:    He has no cervical adenopathy.  Neurological: He is oriented to person, place, and time.  Skin: Skin is warm and dry. No rash noted. He is not diaphoretic. No erythema. No pallor.  Vitals reviewed.   Lab Results  Component Value Date   WBC 8.8 12/05/2014   HGB 15.6 12/05/2014   HCT 45.9 12/05/2014   PLT 275.0 12/05/2014   GLUCOSE 90 04/23/2014   CHOL 196 04/23/2014   TRIG 118.0 04/23/2014   HDL 46.00 04/23/2014   LDLDIRECT 159.3 06/25/2011   LDLCALC 126* 04/23/2014   ALT 22 04/23/2014   AST 22 04/23/2014   NA 141 04/23/2014   K 4.0 04/23/2014   CL 104 04/23/2014   CREATININE 1.07 04/23/2014   BUN 23 04/23/2014   CO2 32 04/23/2014   TSH 2.32 04/23/2014   PSA 0.60 12/05/2007    Dg Chest 2 View  12/05/2014  CLINICAL DATA:  Cough and congestion. Chest tightness for 2 weeks. Fever. EXAM: CHEST  2 VIEW COMPARISON:  06/20/2013 FINDINGS: The lungs appear clear.  Cardiac and mediastinal contours normal. No pleural effusion identified. Mild thoracic spondylosis. IMPRESSION: 1. No active cardiopulmonary disease is radiographically apparent. 2. Mild thoracic spondylosis. Electronically Signed   By: Van Clines M.D.   On: 12/05/2014 13:38    Assessment & Plan:   Tymir was seen today for cough.  Diagnoses and all orders for this visit:  Extrinsic asthma, mild persistent, uncomplicated- he has  mild persistent symptoms I have asked him to start using an ICS/LABA combination -     Fluticasone Furoate-Vilanterol (BREO ELLIPTA) 200-25 MCG/INH AEPB; Inhale 1 puff into the lungs daily.  Allergic rhinitis due to pollen- will continue fluticasone nasal spray -     fluticasone (FLONASE) 50 MCG/ACT nasal spray; Place 2 sprays into both nostrils daily.  Lower respiratory tract infection- he has signs and symptoms consistent with a bacterial lung infection, will treat empirically with Avelox and he will take Tussionex suspension as needed for the cough. -     moxifloxacin (AVELOX) 400 MG tablet; Take 1 tablet (400 mg total) by mouth daily. -     chlorpheniramine-HYDROcodone (TUSSIONEX PENNKINETIC ER) 10-8 MG/5ML SUER; Take 5 mLs by mouth every 12 (twelve) hours as needed for cough.  Other orders -     Discontinue: methylPREDNISolone acetate (DEPO-MEDROL) injection 120 mg; Inject 1.5 mLs (120 mg total) into the muscle once.   I have discontinued Mr. Rosenow's ranitidine and cyclobenzaprine. I am also having him start on fluticasone, Fluticasone Furoate-Vilanterol, moxifloxacin, and chlorpheniramine-HYDROcodone. Additionally, I am having  him maintain his albuterol, TURMERIC PO, cholecalciferol, montelukast, traZODone, and lansoprazole.  Meds ordered this encounter  Medications  . fluticasone (FLONASE) 50 MCG/ACT nasal spray    Sig: Place 2 sprays into both nostrils daily.    Dispense:  16 g    Refill:  11  . Fluticasone Furoate-Vilanterol (BREO ELLIPTA) 200-25 MCG/INH AEPB    Sig: Inhale 1 puff into the lungs daily.    Dispense:  30 each    Refill:  11  . moxifloxacin (AVELOX) 400 MG tablet    Sig: Take 1 tablet (400 mg total) by mouth daily.    Dispense:  5 tablet    Refill:  0  . chlorpheniramine-HYDROcodone (TUSSIONEX PENNKINETIC ER) 10-8 MG/5ML SUER    Sig: Take 5 mLs by mouth every 12 (twelve) hours as needed for cough.    Dispense:  140 mL    Refill:  0  . DISCONTD:  methylPREDNISolone acetate (DEPO-MEDROL) injection 120 mg    Sig:      Follow-up: Return in about 3 weeks (around 05/14/2015).  Scarlette Calico, MD

## 2015-04-23 NOTE — Progress Notes (Signed)
Pre visit review using our clinic review tool, if applicable. No additional management support is needed unless otherwise documented below in the visit note. 

## 2015-04-23 NOTE — Patient Instructions (Signed)

## 2015-04-24 ENCOUNTER — Ambulatory Visit (INDEPENDENT_AMBULATORY_CARE_PROVIDER_SITE_OTHER): Payer: BLUE CROSS/BLUE SHIELD | Admitting: Family Medicine

## 2015-04-24 ENCOUNTER — Ambulatory Visit (INDEPENDENT_AMBULATORY_CARE_PROVIDER_SITE_OTHER)
Admission: RE | Admit: 2015-04-24 | Discharge: 2015-04-24 | Disposition: A | Discharge status: Home / Self Care | Payer: BLUE CROSS/BLUE SHIELD | Source: Ambulatory Visit | Attending: Family Medicine | Admitting: Family Medicine

## 2015-04-24 ENCOUNTER — Encounter: Payer: Self-pay | Admitting: Family Medicine

## 2015-04-24 VITALS — BP 118/78 | HR 70 | Wt 183.0 lb

## 2015-04-24 DIAGNOSIS — M9903 Segmental and somatic dysfunction of lumbar region: Secondary | ICD-10-CM

## 2015-04-24 DIAGNOSIS — M6281 Muscle weakness (generalized): Secondary | ICD-10-CM

## 2015-04-24 DIAGNOSIS — M545 Low back pain, unspecified: Secondary | ICD-10-CM

## 2015-04-24 DIAGNOSIS — M9902 Segmental and somatic dysfunction of thoracic region: Secondary | ICD-10-CM

## 2015-04-24 DIAGNOSIS — G5701 Lesion of sciatic nerve, right lower limb: Secondary | ICD-10-CM | POA: Insufficient documentation

## 2015-04-24 DIAGNOSIS — M9904 Segmental and somatic dysfunction of sacral region: Secondary | ICD-10-CM

## 2015-04-24 DIAGNOSIS — M999 Biomechanical lesion, unspecified: Secondary | ICD-10-CM

## 2015-04-24 MED ORDER — VITAMIN D (ERGOCALCIFEROL) 1.25 MG (50000 UNIT) PO CAPS: 50000.0000 [IU] | ORAL_CAPSULE | ORAL | Status: DC

## 2015-04-24 NOTE — Patient Instructions (Addendum)
Good to see you  Once weekly vitamin D for next 8 weeks Iron 65mg  daily with 500mg  of vitamin C Keep up with the other exercises Ice when hurting Xray downstairs today  See me again 3 weeks and if not a lot better lets do an injection in the piriiformis.

## 2015-04-24 NOTE — Assessment & Plan Note (Addendum)
Piriformis Syndrome  Using an anatomical model, reviewed with the patient the structures involved and how they related to diagnosis. The patient indicated understanding.   The patient was given a handout from Dr. Arne Cleveland book "The Sports Medicine Patient Advisor" describing the anatomy and rehabilitation of the following condition: Piriformis Syndrome  Also given a handout with more extensive Piriformis stretching, hip flexor and abductor strengthening, ham stretching  Rec deep massage, explained self-massage with ball rtc in 3 weeks if worsening symptoms we will consider injection.

## 2015-04-24 NOTE — Assessment & Plan Note (Signed)
Decision today to treat with OMT was based on Physical Exam  After verbal consent patient was treated with HVLA, ME techniques in thoracic, cervical, lumbar and sacral areas  Patient tolerated the procedure well with improvement in symptoms  Patient given exercises, stretches and lifestyle modifications  See medications in patient instructions if given  Patient will follow up in 3-4 weeks

## 2015-04-24 NOTE — Progress Notes (Signed)
  [name] [address], [place] [address] Phone: [phone] Subjective:     CC: Back pain follow-up  [id number] George [name] is a 50 y.o. male coming in with complaint of back discomfort.  She was seen previously and had responded very well to postural changes, as well as osteopathic manipulation. Patient though states that he continues to have the discomfort mostly over the right side now. Seems to be worse when he is doing activity such as trying to play sports with his kids. Patient rates the severity of pain as 7 out of 10. States that it is not debilitating but it is annoying.    Past medical history, social, surgical and family history all reviewed in electronic medical record.   Review of Systems: No headache, visual changes, nausea, vomiting, diarrhea, constipation, dizziness, abdominal pain, skin rash, fevers, chills, night sweats, weight loss, swollen lymph nodes, body aches, joint swelling, muscle aches, chest pain, shortness of breath, mood changes.   Objective Blood pressure 118/78, pulse 70, weight 183 lb (83.008 kg), SpO2 97 %.  General: No apparent distress alert and oriented x3 mood and affect normal, dressed appropriately.  HEENT: Pupils equal, extraocular movements intact  Respiratory: Patient's speak in full sentences and does not appear short of breath  Cardiovascular: No lower extremity edema, non tender, no erythema  Skin: Warm dry intact with no signs of infection or rash on extremities or on axial skeleton.  Abdomen: Soft nontender  Neuro: Cranial nerves II through XII are intact, neurovascularly intact in all extremities with 2+ DTRs and 2+ pulses.  Lymph: No lymphadenopathy of posterior or anterior cervical chain or axillae bilaterally.  Gait normal with good balance and coordination.  MSK:  Non tender with full range of motion and good stability and symmetric strength and tone of shoulders, elbows, wrist, knee and  ankles bilaterally.  Back Exam:  Inspection: Unremarkable  Motion: Flexion 35 deg, Extension 35 deg, Side Bending to 35 deg bilaterally,  Rotation to 35 deg bilaterally  SLR laying: Negative  XSLR laying: Negative  Palpable tenderness: Mild tenderness of the paraspinal musculature of the lumbar spine at the sacral junction more on the right side as well as over the piriformis FABER: negative. Sensory change: Gross sensation intact to all lumbar and sacral dermatomes.  Reflexes: 2+ at both patellar tendons, 2+ at achilles tendons, Babinski's downgoing.  Strength at foot  Plantar-flexion: 5/5 Dorsi-flexion: 5/5 Eversion: 5/5 Inversion: 5/5  Leg strength  Quad: 5/5 Hamstring: 4/5 Hip flexor: 5/5 Hip abductors: 4/5 but symmetric mild improvement Continued tightness in the hip flexors  Osteopathic findings C4 flexed rotated and side bent left T3 extended rotated and side bent right  T7 extended rotated and side bent left L3 flexed rotated inside that right Sacrum left on left      Impression and Recommendations:     This case required medical decision making of moderate complexity.

## 2015-04-24 NOTE — Progress Notes (Signed)
Pre visit review using our clinic review tool, if applicable. No additional management support is needed unless otherwise documented below in the visit note. 

## 2015-04-24 NOTE — Assessment & Plan Note (Signed)
Still having some muscle weakness. Possible vitamin D deficiency. Patient does not want to have labs checked but if we continue to do so we may want to rule out any autoimmune disease that is keeping patient from healing. Patient continues respond well to osteopathic manipulation. Patient come back and see me again in 3-4 weeks for further evaluation and treatment.

## 2015-05-15 ENCOUNTER — Ambulatory Visit: Payer: BLUE CROSS/BLUE SHIELD | Admitting: Family Medicine

## 2015-05-19 ENCOUNTER — Ambulatory Visit (INDEPENDENT_AMBULATORY_CARE_PROVIDER_SITE_OTHER): Payer: BLUE CROSS/BLUE SHIELD | Admitting: Family Medicine

## 2015-05-19 ENCOUNTER — Encounter: Payer: Self-pay | Admitting: Family Medicine

## 2015-05-19 VITALS — BP 132/80 | HR 63 | Wt 177.0 lb

## 2015-05-19 DIAGNOSIS — M9904 Segmental and somatic dysfunction of sacral region: Secondary | ICD-10-CM | POA: Diagnosis not present

## 2015-05-19 DIAGNOSIS — M9902 Segmental and somatic dysfunction of thoracic region: Secondary | ICD-10-CM | POA: Diagnosis not present

## 2015-05-19 DIAGNOSIS — M9903 Segmental and somatic dysfunction of lumbar region: Secondary | ICD-10-CM

## 2015-05-19 DIAGNOSIS — M999 Biomechanical lesion, unspecified: Secondary | ICD-10-CM | POA: Insufficient documentation

## 2015-05-19 DIAGNOSIS — G5701 Lesion of sciatic nerve, right lower limb: Secondary | ICD-10-CM

## 2015-05-19 MED ORDER — GABAPENTIN 100 MG PO CAPS: 200.0000 mg | ORAL_CAPSULE | Freq: Every day | ORAL | Status: DC

## 2015-05-19 NOTE — Assessment & Plan Note (Signed)
Patient may continue to have a piriformis syndrome. There is also possibility of radiculopathy. Patient given gabapentin today and overuse will be beneficial. Encourage him to continue home exercises in the icing. Continue the range of motion exercises. Patient will come back and see me again in 3-4 weeks for further evaluation and treatment.

## 2015-05-19 NOTE — Patient Instructions (Addendum)
Good to see you  George [name] is your friend Stay active. Only run 1-2 times a week and would do more elliptical and biking if possible.  Gabapentin 100mg  at night for first week then 200mg  at night thereafter.  Continue the exercises See me again in 4 weeks.  If worsening consider injection in the piriformis or we may need to look at the back again

## 2015-05-19 NOTE — Progress Notes (Signed)
  [name] [place], [place] [address] Phone: [phone] Subjective:     CC: Back pain follow-up  [id number] George [name] is a 50 y.o. male coming in with complaint of back discomfort.  Was found to have more of pirformis. Syndrome. Patient doing the exercises. States that overall he seems to be doing better. Still has some discomfort overall. Not without pain. Patient is concerned not likely going to be pain free. Patient denies any radiation down the leg. Patient has been traveling little more quickly.      Past medical history, social, surgical and family history all reviewed in electronic medical record.   Review of Systems: No headache, visual changes, nausea, vomiting, diarrhea, constipation, dizziness, abdominal pain, skin rash, fevers, chills, night sweats, weight loss, swollen lymph nodes, body aches, joint swelling, muscle aches, chest pain, shortness of breath, mood changes.   Objective Blood pressure 132/80, pulse 63, weight 177 lb (80.287 kg), SpO2 96 %.  General: No apparent distress alert and oriented x3 mood and affect normal, dressed appropriately.  HEENT: Pupils equal, extraocular movements intact  Respiratory: Patient's speak in full sentences and does not appear short of breath  Cardiovascular: No lower extremity edema, non tender, no erythema  Skin: Warm dry intact with no signs of infection or rash on extremities or on axial skeleton.  Abdomen: Soft nontender  Neuro: Cranial nerves II through XII are intact, neurovascularly intact in all extremities with 2+ DTRs and 2+ pulses.  Lymph: No lymphadenopathy of posterior or anterior cervical chain or axillae bilaterally.  Gait normal with good balance and coordination.  MSK:  Non tender with full range of motion and good stability and symmetric strength and tone of shoulders, elbows, wrist, knee and ankles bilaterally.  Back Exam:  Inspection: Unremarkable  Motion:  Flexion 35 deg, Extension 25 deg, Side Bending to 35 deg bilaterally,  Rotation to 35 deg bilaterally  SLR laying: Negative  XSLR laying: Negative  Palpable tenderness: Mild tenderness of the paraspinal musculature of the lumbar spine at the sacral junction more on the right side as well as over the piriformis FABER: Mild discomfort on the right side Sensory change: Gross sensation intact to all lumbar and sacral dermatomes.  Reflexes: 2+ at both patellar tendons, 2+ at achilles tendons, Babinski's downgoing.  Strength at foot  Plantar-flexion: 5/5 Dorsi-flexion: 5/5 Eversion: 5/5 Inversion: 5/5  Leg strength  Quad: 5/5 Hamstring: 4/5 Hip flexor: 5/5 Hip abductors: 4/5  Continued tightness in the hip flexors and severe tightness of the hamstring bilaterally  Osteopathic findings C4 flexed rotated and side bent left T3 extended rotated and side bent right  T7 extended rotated and side bent left L2 flexed rotated inside that right Sacrum left on left      Impression and Recommendations:     This case required medical decision making of moderate complexity.

## 2015-05-19 NOTE — Assessment & Plan Note (Signed)
Decision today to treat with OMT was based on Physical Exam  After verbal consent patient was treated with HVLA, ME techniques in cervical, thoracic, lumbar and sacral areas  Patient tolerated the procedure well with improvement in symptoms  Patient given exercises, stretches and lifestyle modifications  See medications in patient instructions if given  Patient will follow up in 3-4 weeks  

## 2015-06-06 ENCOUNTER — Ambulatory Visit (INDEPENDENT_AMBULATORY_CARE_PROVIDER_SITE_OTHER): Payer: BLUE CROSS/BLUE SHIELD | Admitting: Internal Medicine

## 2015-06-06 ENCOUNTER — Encounter: Payer: Self-pay | Admitting: Internal Medicine

## 2015-06-06 VITALS — BP 118/80 | HR 68 | Wt 177.0 lb

## 2015-06-06 DIAGNOSIS — Z800 Family history of malignant neoplasm of digestive organs: Secondary | ICD-10-CM | POA: Diagnosis not present

## 2015-06-06 DIAGNOSIS — K219 Gastro-esophageal reflux disease without esophagitis: Secondary | ICD-10-CM

## 2015-06-06 MED ORDER — LANSOPRAZOLE 30 MG PO CPDR: 30.0000 mg | DELAYED_RELEASE_CAPSULE | Freq: Every day | ORAL | Status: DC

## 2015-06-06 NOTE — Patient Instructions (Signed)
We have sent the following medications to your pharmacy for you to pick up at your convenience: Lansoprazole  Please start taking Pepcid 20 mg one at bed time as needed.  Your colonoscopy is due 04-2016. With your family history of rectal cancer in your father please found out the age of diagnosis and call the office with the info. [phone]

## 2015-06-06 NOTE — Progress Notes (Signed)
Patient ID: George [name], male   DOB: 10/07/1965, 50 y.o.   MRN: [id number] HPI: George [name] is a 50 yo male with PMH of GERD who is seen for follow-up. He was last in the office in March 2015. In June 2015 he had an upper endoscopy to evaluate history of GERD and epigastric pain. Z line was slightly irregular but biopsied. Showed chronic inflammation but no metaplasia or Barrett's esophagus. The rest of the exam was normal. He took [place] which controlled his heartburn well but was expensive. He is now using lansoprazole 30 mg daily. One or 2 days a week he'll have some mild breakthrough heartburn and use over-the-counter Pepcid with good relief. He has not had any odynophagia, dysphagia. No epigastric pain. Good appetite. No early satiety. Bowel movements a been regular without blood in his stool or melena. Update on family history as his father had rectal cancer, age unknown (he will ask).  Due to cough he has been started on Breo which helps significantly. This is felt more related to asthma. He tried stopping PPI in favor of H2 blocker twice a day but heartburn was too difficult for him to handle.  Past Medical History  Diagnosis Date  . GERD (gastroesophageal reflux disease)   . Fainting spell as child  . Anxiety   . Allergy     SEASONAL    Past Surgical History  Procedure Laterality Date  . No past surgeries      Outpatient Prescriptions Prior to Visit  Medication Sig Dispense Refill  . albuterol (PROVENTIL HFA;VENTOLIN HFA) 108 (90 BASE) MCG/ACT inhaler Inhale 2 puffs into the lungs every 6 (six) hours as needed for wheezing or shortness of breath. 1 Inhaler 2  . chlorpheniramine-HYDROcodone (TUSSIONEX PENNKINETIC ER) 10-8 MG/5ML SUER Take 5 mLs by mouth every 12 (twelve) hours as needed for cough. 140 mL 0  . cholecalciferol (VITAMIN D) 1000 UNITS tablet Take 1,000 Units by mouth daily. Take 2 pills daily    . fluticasone (FLONASE) 50 MCG/ACT nasal spray Place 2 sprays into both  nostrils daily. 16 g 11  . Fluticasone Furoate-Vilanterol (BREO ELLIPTA) 200-25 MCG/INH AEPB Inhale 1 puff into the lungs daily. 30 each 11  . gabapentin (NEURONTIN) 100 MG capsule Take 2 capsules (200 mg total) by mouth at bedtime. 60 capsule 3  . montelukast (SINGULAIR) 10 MG tablet Take 1 tablet (10 mg total) by mouth at bedtime. 30 tablet 3  . traZODone (DESYREL) 50 MG tablet Take 0.5-1 tablets (25-50 mg total) by mouth at bedtime as needed for sleep. 90 tablet 3  . TURMERIC PO Take by mouth 2 (two) times daily.    . Vitamin D, Ergocalciferol, (DRISDOL) 50000 units CAPS capsule Take 1 capsule (50,000 Units total) by mouth every 7 (seven) days. 8 capsule 0  . lansoprazole (PREVACID) 30 MG capsule Take 1 capsule (30 mg total) by mouth daily before breakfast. 30 capsule 1   No facility-administered medications prior to visit.    No Known Allergies  Family History  Problem Relation Age of Onset  . Obesity Father   . Asthma Father   . Hypertension Father   . Bladder Cancer Father   . Diabetes Neg Hx   . Early death Neg Hx   . Heart disease Neg Hx   . Hyperlipidemia Neg Hx   . Kidney disease Neg Hx   . Stroke Neg Hx   . Colon cancer Maternal Grandmother     Social History  Substance Use Topics  .  Smoking status: Never Smoker   . Smokeless tobacco: Never Used  . Alcohol Use: No     Comment: ONCE A WEEK    ROS: As per history of present illness, otherwise negative  BP 118/80 mmHg  Pulse 68  Wt 177 lb (80.287 kg) Constitutional: Well-developed and well-nourished. No distress. HEENT: Normocephalic and atraumatic. Oropharynx is clear and moist. No oropharyngeal exudate. Conjunctivae are normal.  No scleral icterus. Neck: Neck supple. Trachea midline. Cardiovascular: Normal rate, regular rhythm and intact distal pulses. No M/R/G Pulmonary/chest: Effort normal and breath sounds normal. No wheezing, rales or rhonchi. Abdominal: Soft, nontender, nondistended. Bowel sounds active  throughout. There are no masses palpable. No hepatosplenomegaly. Extremities: no clubbing, cyanosis, or edema Lymphadenopathy: No cervical adenopathy noted. Neurological: Alert and oriented to person place and time. Skin: Skin is warm and dry. No rashes noted. Psychiatric: Normal mood and affect. Behavior is normal.  RELEVANT LABS AND IMAGING: CBC    Component Value Date/Time   WBC 8.8 12/05/2014 1112   RBC 5.03 12/05/2014 1112   HGB 15.6 12/05/2014 1112   HCT 45.9 12/05/2014 1112   PLT 275.0 12/05/2014 1112   MCV 91.2 12/05/2014 1112   MCHC 33.9 12/05/2014 1112   RDW 13.2 12/05/2014 1112   LYMPHSABS 2.3 12/05/2014 1112   MONOABS 0.7 12/05/2014 1112   EOSABS 0.2 12/05/2014 1112   BASOSABS 0.0 12/05/2014 1112    CMP     Component Value Date/Time   NA 141 04/23/2014 0913   K 4.0 04/23/2014 0913   CL 104 04/23/2014 0913   CO2 32 04/23/2014 0913   GLUCOSE 90 04/23/2014 0913   BUN 23 04/23/2014 0913   CREATININE 1.07 04/23/2014 0913   CALCIUM 9.2 04/23/2014 0913   PROT 7.2 04/23/2014 0913   ALBUMIN 4.3 04/23/2014 0913   AST 22 04/23/2014 0913   ALT 22 04/23/2014 0913   ALKPHOS 66 04/23/2014 0913   BILITOT 0.6 04/23/2014 0913   GFRNONAA 65 12/05/2007 1039   GFRAA 78 12/05/2007 1039    ASSESSMENT/PLAN: 50 yo male with PMH of GERD who is seen for follow-up.  1. GERD -- currently well controlled on once daily PPI. Continue lansoprazole 30 mg daily, 30 minutes before breakfast. Over-the-counter Pepcid 10-20 mg can be used for breakthrough heartburn when this occurs. No evidence of Barrett's esophagus at endoscopy which is reassuring.  2. Family history of rectal cancer in father -- I asked that he clarify with his father when the diagnosis of rectal cancer was made. If it was less than 107 years old I recommended screening colonoscopy now. If it was greater than 60 I recommend screening colonoscopy beginning at age 81 which would be next February. Clarify and let me  know.  One year follow-up, sooner if necessary 15 minutes spent with the patient today. Greater than 50% was spent in counseling and coordination of care with the patient    Cc:George Lima, Md [address] [facility] [address] [place], [place] [address]

## 2015-06-17 ENCOUNTER — Ambulatory Visit: Payer: BLUE CROSS/BLUE SHIELD | Admitting: Family Medicine

## 2015-07-01 ENCOUNTER — Encounter: Payer: Self-pay | Admitting: Family Medicine

## 2015-07-01 ENCOUNTER — Ambulatory Visit (INDEPENDENT_AMBULATORY_CARE_PROVIDER_SITE_OTHER): Payer: BLUE CROSS/BLUE SHIELD | Admitting: Family Medicine

## 2015-07-01 VITALS — BP 118/80 | HR 74 | Ht 68.0 in | Wt 178.0 lb

## 2015-07-01 DIAGNOSIS — G5701 Lesion of sciatic nerve, right lower limb: Secondary | ICD-10-CM | POA: Diagnosis not present

## 2015-07-01 DIAGNOSIS — M9902 Segmental and somatic dysfunction of thoracic region: Secondary | ICD-10-CM | POA: Diagnosis not present

## 2015-07-01 DIAGNOSIS — M9903 Segmental and somatic dysfunction of lumbar region: Secondary | ICD-10-CM | POA: Diagnosis not present

## 2015-07-01 DIAGNOSIS — M9904 Segmental and somatic dysfunction of sacral region: Secondary | ICD-10-CM | POA: Diagnosis not present

## 2015-07-01 DIAGNOSIS — M999 Biomechanical lesion, unspecified: Secondary | ICD-10-CM

## 2015-07-01 NOTE — Assessment & Plan Note (Signed)
Decision today to treat with OMT was based on Physical Exam  After verbal consent patient was treated with HVLA, ME techniques in cervical, thoracic, lumbar and sacral areas  Patient tolerated the procedure well with improvement in symptoms  Patient given exercises, stretches and lifestyle modifications  See medications in patient instructions if given  Patient will follow up in 6 weeks      

## 2015-07-01 NOTE — Assessment & Plan Note (Signed)
Overall stable.  Encouraged more strengthening in addition to the stretching.  Discussed with patient about core strengthening exercises. We discussed continuing the osteopathic manipulation fairly regularly. Discussed icing. Patient will continue with the over-the-counter medications in the vitamin D supplementation. Patient will come back and see me again in 6 weeks.

## 2015-07-01 NOTE — Progress Notes (Signed)
[name] [name], [place] [address] Phone: [phone] Subjective:     CC: Back pain follow-up  [id number] George [name] is a 50 y.o. male coming in with complaint of back discomfort.  Was found to have more of pirformis. Patient though has not been running. States incisions been doing as he is feeling somewhat better. Patient states that he does do other type working out on a fairly regularly at this time. Denies any radiation of the leg. Overall he states and only when he runs doesn't seem to give him significant amount of discomfort.  Past Medical History  Diagnosis Date  . GERD (gastroesophageal reflux disease)   . Fainting spell as child  . Anxiety   . Allergy     SEASONAL   Past Surgical History  Procedure Laterality Date  . No past surgeries     Social History  Substance Use Topics  . Smoking status: Never Smoker   . Smokeless tobacco: Never Used  . Alcohol Use: No     Comment: ONCE A WEEK   No Known Allergies Family History  Problem Relation Age of Onset  . Obesity Father   . Asthma Father   . Hypertension Father   . Bladder Cancer Father   . Diabetes Neg Hx   . Early death Neg Hx   . Heart disease Neg Hx   . Hyperlipidemia Neg Hx   . Kidney disease Neg Hx   . Stroke Neg Hx   . Colon cancer Maternal Grandmother          Past medical history, social, surgical and family history all reviewed in electronic medical record.   Review of Systems: No headache, visual changes, nausea, vomiting, diarrhea, constipation, dizziness, abdominal pain, skin rash, fevers, chills, night sweats, weight loss, swollen lymph nodes, body aches, joint swelling, muscle aches, chest pain, shortness of breath, mood changes.   Objective Blood pressure 118/80, pulse 74, height 5\' 8"  (1.727 m), weight 178 lb (80.74 kg), SpO2 98 %.  General: No apparent distress alert and oriented x3 mood and affect normal, dressed appropriately.    HEENT: Pupils equal, extraocular movements intact  Respiratory: Patient's speak in full sentences and does not appear short of breath  Cardiovascular: No lower extremity edema, non tender, no erythema  Skin: Warm dry intact with no signs of infection or rash on extremities or on axial skeleton.  Abdomen: Soft nontender  Neuro: Cranial nerves II through XII are intact, neurovascularly intact in all extremities with 2+ DTRs and 2+ pulses.  Lymph: No lymphadenopathy of posterior or anterior cervical chain or axillae bilaterally.  Gait normal with good balance and coordination.  MSK:  Non tender with full range of motion and good stability and symmetric strength and tone of shoulders, elbows, wrist, knee and ankles bilaterally.  Back Exam:  Inspection: Unremarkable  Motion: Flexion 35 deg, Extension 25 deg, Side Bending to 35 deg bilaterally,  Rotation to 35 deg bilaterally  SLR laying: Negative  XSLR laying: Negative  Palpable tenderness: Mild tenderness of the paraspinal musculature of the lumbar spine at the sacral junction more on the right side as well as over the piriformis FABER: positive right side Sensory change: Gross sensation intact to all lumbar and sacral dermatomes.  Reflexes: 2+ at both patellar tendons, 2+ at achilles tendons, Babinski's downgoing.  Strength at foot  Plantar-flexion: 5/5 Dorsi-flexion: 5/5 Eversion: 5/5 Inversion: 5/5  Leg strength  Quad: 5/5 Hamstring: 4/5 Hip flexor:  5/5 Hip abductors: 4/5  Continue mild tenderness of the hamstrings with tenderness.  Osteopathic findings C3 flexed rotated and side bent left T3 extended rotated and side bent right  T7 extended rotated and side bent left L2 flexed rotated inside that right Sacrum left on left      Impression and Recommendations:     This case required medical decision making of moderate complexity.

## 2015-07-01 NOTE — Patient Instructions (Signed)
Verbal instructions given

## 2015-07-01 NOTE — Progress Notes (Signed)
Pre visit review using our clinic review tool, if applicable. No additional management support is needed unless otherwise documented below in the visit note. 

## 2015-08-12 ENCOUNTER — Telehealth: Payer: Self-pay | Admitting: Family Medicine

## 2015-08-12 NOTE — Telephone Encounter (Signed)
Left message to call back  

## 2015-08-12 NOTE — Telephone Encounter (Signed)
Spoke with patient. Confirmed that if he decided to do a piriformis injection on Thursday at appointment that he would need to have someone with him to drive. Patient verbalized understanding.

## 2015-08-12 NOTE — Telephone Encounter (Signed)
Patient would like a call back in regards to an injection as a treatment for back.  Would like to know if this would be something he could do on his upcoming appointment and if he would need to bring someone with him?

## 2015-08-14 ENCOUNTER — Other Ambulatory Visit: Payer: Self-pay

## 2015-08-14 ENCOUNTER — Ambulatory Visit (INDEPENDENT_AMBULATORY_CARE_PROVIDER_SITE_OTHER): Payer: BLUE CROSS/BLUE SHIELD | Admitting: Family Medicine

## 2015-08-14 ENCOUNTER — Encounter: Payer: Self-pay | Admitting: Family Medicine

## 2015-08-14 VITALS — BP 118/82 | HR 76 | Wt 175.0 lb

## 2015-08-14 DIAGNOSIS — M5416 Radiculopathy, lumbar region: Secondary | ICD-10-CM | POA: Diagnosis not present

## 2015-08-14 DIAGNOSIS — M9903 Segmental and somatic dysfunction of lumbar region: Secondary | ICD-10-CM | POA: Diagnosis not present

## 2015-08-14 DIAGNOSIS — M9902 Segmental and somatic dysfunction of thoracic region: Secondary | ICD-10-CM | POA: Diagnosis not present

## 2015-08-14 DIAGNOSIS — G5701 Lesion of sciatic nerve, right lower limb: Secondary | ICD-10-CM | POA: Diagnosis not present

## 2015-08-14 DIAGNOSIS — M9904 Segmental and somatic dysfunction of sacral region: Secondary | ICD-10-CM

## 2015-08-14 DIAGNOSIS — M999 Biomechanical lesion, unspecified: Secondary | ICD-10-CM

## 2015-08-14 MED ORDER — PREDNISONE 50 MG PO TABS: 50.0000 mg | ORAL_TABLET | Freq: Every day | ORAL | Status: DC

## 2015-08-14 NOTE — Assessment & Plan Note (Signed)
She did have a straight leg test today. We will monitor. If worsening symptoms advance imaging would be warranted. Patient is artery done all other conservative therapy for the piriformis which would be also most of the therapy for lumbar spine pathology.

## 2015-08-14 NOTE — Assessment & Plan Note (Signed)
Patient was injected today and tolerated the procedure well. Neer complete resolution of most of the pain. Still had tightness and continue to respond to osteopathic manipulation. We discussed with patient at great length. If patient's pain does not completely resolved with the new findings of the straight leg test I would consider possible advance imaging. Patient's x-rays of his lumbar spine in February does show sacralization of L5 that could be concerning to some of the discomfort and some impingement on the S1 nerve root. This was independently visualized by me. Patient will continue with conservative therapy otherwise can continue the gabapentin. Will see me again in 3 weeks when he is back in town.

## 2015-08-14 NOTE — Progress Notes (Signed)
[name] [address] [place], [place] [address] Phone: [phone] Subjective:     CC: Back pain follow-up  [id number] George [name] is a 50 y.o. male coming in with complaint of back discomfort.  Was found to have more of pirformis. Has started running again. Seems to be doing relatively well but is only half where he would like to be. Patient continues to have discomfort in the buttock. Now having some mild radiation going down the leg. Very minimal. States that he also has what seems to be a migratory pain in the lower back which is also seems to be a little bit more new. Patient will be leaving town and doing a lot of hiking. Concern that pain will continue to give him trouble.  Patient last summer and did have what sounds to be a herniated disc [place]. No further workup was done but did take approximately 3 weeks to completely resolved.  Past Medical History  Diagnosis Date  . GERD (gastroesophageal reflux disease)   . Fainting spell as child  . Anxiety   . Allergy     SEASONAL   Past Surgical History  Procedure Laterality Date  . No past surgeries     Social History  Substance Use Topics  . Smoking status: Never Smoker   . Smokeless tobacco: Never Used  . Alcohol Use: No     Comment: ONCE A WEEK   No Known Allergies Family History  Problem Relation Age of Onset  . Obesity Father   . Asthma Father   . Hypertension Father   . Bladder Cancer Father   . Diabetes Neg Hx   . Early death Neg Hx   . Heart disease Neg Hx   . Hyperlipidemia Neg Hx   . Kidney disease Neg Hx   . Stroke Neg Hx   . Colon cancer Maternal Grandmother          Past medical history, social, surgical and family history all reviewed in electronic medical record.   Review of Systems: No headache, visual changes, nausea, vomiting, diarrhea, constipation, dizziness, abdominal pain, skin rash, fevers, chills, night sweats, weight loss, swollen  lymph nodes, body aches, joint swelling, muscle aches, chest pain, shortness of breath, mood changes.   Objective Blood pressure 118/82, pulse 76, weight 175 lb (79.379 kg).  General: No apparent distress alert and oriented x3 mood and affect normal, dressed appropriately.  HEENT: Pupils equal, extraocular movements intact  Respiratory: Patient's speak in full sentences and does not appear short of breath  Cardiovascular: No lower extremity edema, non tender, no erythema  Skin: Warm dry intact with no signs of infection or rash on extremities or on axial skeleton.  Abdomen: Soft nontender  Neuro: Cranial nerves II through XII are intact, neurovascularly intact in all extremities with 2+ DTRs and 2+ pulses.  Lymph: No lymphadenopathy of posterior or anterior cervical chain or axillae bilaterally.  Gait normal with good balance and coordination.  MSK:  Non tender with full range of motion and good stability and symmetric strength and tone of shoulders, elbows, wrist, knee and ankles bilaterally.  Back Exam:  Inspection: Unremarkable  Motion: Flexion 35 deg, Extension 25 deg, Side Bending to 35 deg bilaterally,  Rotation to 35 deg bilaterally  SLR laying: Mild positive which is new XSLR laying: Negative  Palpable tenderness: Worsening tenderness over the piriformis muscle from previous visit. Patient also has some increasing stiffness of the lumbar  spine.Marland Kitchen FABER: positive right side Sensory change: Gross sensation intact to all lumbar and sacral dermatomes.  Reflexes: 2+ at both patellar tendons, 2+ at achilles tendons, Babinski's downgoing.  Strength at foot  Plantar-flexion: 5/5 Dorsi-flexion: 5/5 Eversion: 5/5 Inversion: 5/5  Leg strength  Quad: 5/5 Hamstring: 4/5 Hip flexor: 5/5 Hip abductors: 4/5  Continue mild tenderness of the hamstrings with tenderness.  Osteopathic findings C3 flexed rotated and side bent left T3 extended rotated and side bent right  T7 extended rotated and  side bent left L2 flexed rotated inside that right Sacrum left on left  Procedure: Real-time Ultrasound Guided Injection of right piriformis tendon sheath injection Device: GE Logiq E  Ultrasound guided injection is preferred based studies that show increased duration, increased effect, greater accuracy, decreased procedural pain, increased response rate, and decreased cost with ultrasound guided versus blind injection.  Verbal informed consent obtained.  Time-out conducted.  Noted no overlying erythema, induration, or other signs of local infection.  Skin prepped in a sterile fashion.  Local anesthesia: Topical Ethyl chloride.  With sterile technique and under real time ultrasound guidance:  With a 21-gauge 2 inch needle patient was injected with a total of 1 mL of 0.5% Marcaine and 1 mL of Kenalog 40 mg/dL under ultrasound guidance. Completed without difficulty  Pain immediately resolved suggesting accurate placement of the medication.  Advised to call if fevers/chills, erythema, induration, drainage, or persistent bleeding.  Images permanently stored and available for review in the ultrasound unit.  Impression: Technically successful ultrasound guided injection.   Impression and Recommendations:     This case required medical decision making of moderate complexity.

## 2015-08-14 NOTE — Assessment & Plan Note (Signed)
Decision today to treat with OMT was based on Physical Exam  After verbal consent patient was treated with HVLA, ME techniques in cervical, thoracic, lumbar and sacral areas  Patient tolerated the procedure well with improvement in symptoms  Patient given exercises, stretches and lifestyle modifications  See medications in patient instructions if given  Patient will follow up in 3 weeks    

## 2015-08-14 NOTE — Patient Instructions (Addendum)
Good to see you  We will inject the piriformis today  I hope it helps I would like you to give me a message during the weekend and tell me how you are doing.  If the pain is resolved lets then hold on MRI.   If not better then we will get MRI of your back to see if a nerve is causing the problem.  COntinue the gabapentin Stay active Have a great trip and we will be in contact.  I will send in prednsione so you have it for your trip just in case.

## 2015-08-15 ENCOUNTER — Other Ambulatory Visit: Payer: Self-pay | Admitting: *Deleted

## 2015-08-15 MED ORDER — PREDNISONE 50 MG PO TABS: 50.0000 mg | ORAL_TABLET | Freq: Every day | ORAL | Status: DC

## 2015-08-15 NOTE — Telephone Encounter (Signed)
Resent to correct pharmacy.

## 2015-08-17 ENCOUNTER — Encounter: Payer: Self-pay | Admitting: Family Medicine

## 2015-11-13 ENCOUNTER — Encounter: Payer: Self-pay | Admitting: Family Medicine

## 2015-11-13 ENCOUNTER — Ambulatory Visit (INDEPENDENT_AMBULATORY_CARE_PROVIDER_SITE_OTHER): Payer: BLUE CROSS/BLUE SHIELD | Admitting: Family Medicine

## 2015-11-13 VITALS — BP 106/74 | HR 65

## 2015-11-13 DIAGNOSIS — M9903 Segmental and somatic dysfunction of lumbar region: Secondary | ICD-10-CM | POA: Diagnosis not present

## 2015-11-13 DIAGNOSIS — M9904 Segmental and somatic dysfunction of sacral region: Secondary | ICD-10-CM | POA: Diagnosis not present

## 2015-11-13 DIAGNOSIS — M9902 Segmental and somatic dysfunction of thoracic region: Secondary | ICD-10-CM

## 2015-11-13 DIAGNOSIS — M999 Biomechanical lesion, unspecified: Secondary | ICD-10-CM

## 2015-11-13 DIAGNOSIS — G5701 Lesion of sciatic nerve, right lower limb: Secondary | ICD-10-CM | POA: Diagnosis not present

## 2015-11-13 NOTE — Assessment & Plan Note (Signed)
Decision today to treat with OMT was based on Physical Exam  After verbal consent patient was treated with HVLA, ME techniques in cervical, thoracic, lumbar and sacral areas  Patient tolerated the procedure well with improvement in symptoms  Patient given exercises, stretches and lifestyle modifications  See medications in patient instructions if given  Patient will follow up in 4 weeks   

## 2015-11-13 NOTE — Progress Notes (Signed)
[name] [address] [place], [place] [address] Phone: [phone] Subjective:     CC: Back pain follow-up  [id number]  George [name] is a 50 y.o. male coming in with complaint of back discomfort.  Was found to have more of pirformis.  Patient also had more of a degenerative disc disease and has had a history of herniated disc. Patient's has not been seen for 3 months.   Patient last saw me 3 months ago and was given an injection in the piriformis. States it is still considerably better than what it was prior to the injection. Starting to have some mild discomfort. Started to try to lift weights he other day and is having some soreness.. Mild overall. Patient though does have a goal to participate in a sprint triathlon for his 51th birthday.  Past Medical History:  Diagnosis Date  . Allergy    SEASONAL  . Anxiety   . Fainting spell as child  . GERD (gastroesophageal reflux disease)    Past Surgical History:  Procedure Laterality Date  . NO PAST SURGERIES     Social History  Substance Use Topics  . Smoking status: Never Smoker  . Smokeless tobacco: Never Used  . Alcohol use No     Comment: ONCE A WEEK   No Known Allergies Family History  Problem Relation Age of Onset  . Obesity Father   . Asthma Father   . Hypertension Father   . Bladder Cancer Father   . Diabetes Neg Hx   . Early death Neg Hx   . Heart disease Neg Hx   . Hyperlipidemia Neg Hx   . Kidney disease Neg Hx   . Stroke Neg Hx   . Colon cancer Maternal Grandmother          Past medical history, social, surgical and family history all reviewed in electronic medical record.   Review of Systems: No headache, visual changes, nausea, vomiting, diarrhea, constipation, dizziness, abdominal pain, skin rash, fevers, chills, night sweats, weight loss, swollen lymph nodes, body aches, joint swelling, muscle aches, chest pain, shortness of breath, mood changes.   Objective    Blood pressure 106/74, pulse 65, SpO2 96 %.  General: No apparent distress alert and oriented x3 mood and affect normal, dressed appropriately.  HEENT: Pupils equal, extraocular movements intact  Respiratory: Patient's speak in full sentences and does not appear short of breath  Cardiovascular: No lower extremity edema, non tender, no erythema  Skin: Warm dry intact with no signs of infection or rash on extremities or on axial skeleton.  Abdomen: Soft nontender  Neuro: Cranial nerves II through XII are intact, neurovascularly intact in all extremities with 2+ DTRs and 2+ pulses.  Lymph: No lymphadenopathy of posterior or anterior cervical chain or axillae bilaterally.  Gait normal with good balance and coordination.  MSK:  Non tender with full range of motion and good stability and symmetric strength and tone of shoulders, elbows, wrist, knee and ankles bilaterally.  Back Exam:  Inspection: Unremarkable  Motion: Flexion 35 deg, Extension 25 deg, Side Bending to 35 deg bilaterally,  Rotation to 35 deg bilaterally  SLR laying: Negative XSLR laying: Negative  Palpable tenderness: Minimal tenderness over the piriformis FABER: positive right side still present Sensory change: Gross sensation intact to all lumbar and sacral dermatomes.  Reflexes: 2+ at both patellar tendons, 2+ at achilles tendons, Babinski's downgoing.  Strength at foot  Plantar-flexion: 5/5 Dorsi-flexion: 5/5 Eversion: 5/5 Inversion: 5/5  Leg strength  Quad: 5/5 Hamstring: 4/5 Hip flexor: 5/5 Hip abductors: 4/5  Continued tenderness the hamstring but nontender.  Osteopathic findings C3 flexed rotated and side bent left T3 extended rotated and side bent right  T10 extended rotated and side bent left L2 flexed rotated inside that right Sacrum left on left    Impression and Recommendations:     This case required medical decision making of moderate complexity.

## 2015-11-13 NOTE — Patient Instructions (Signed)
Good to see you  Ice is yrou friend after working out Goal of at least 75 grams of protein a day  Eat within 30 minutes of working out and a 3:1 ratio of carbs to protein with at least 20 grams of protein  Vega sport protein powder I liek a lot Look into E3 triathlon training Exercise stretch eat is your routein  Track food in my fitness pal to get a baseline and make adjustments Beginner triathelete or megalathon are good apps for training as well Turmeric as needed Continue the vitamin D See me again in 4 weeks"

## 2015-11-13 NOTE — Assessment & Plan Note (Signed)
Seems to be doing fairly well. No lumbar radiculopathy. We discussed icing regimen and core strengthening. We discussed stability exercises. Patient given a exercise prescription for him to try to achieve his goals. Encourage patient to work on diet as well. Patient and will come back and see me again in 4 weeks for further evaluation and treatment.

## 2015-12-17 ENCOUNTER — Other Ambulatory Visit: Payer: Self-pay | Admitting: Family Medicine

## 2015-12-17 NOTE — Telephone Encounter (Signed)
Refill done.  

## 2016-05-02 ENCOUNTER — Other Ambulatory Visit: Payer: Self-pay | Admitting: Family Medicine

## 2016-05-03 NOTE — Telephone Encounter (Signed)
Refill done.  

## 2016-05-13 ENCOUNTER — Encounter: Payer: Self-pay | Admitting: Internal Medicine

## 2016-06-08 ENCOUNTER — Other Ambulatory Visit: Payer: Self-pay | Admitting: Internal Medicine

## 2016-07-18 NOTE — Progress Notes (Signed)
[name] [place], [place] [address] Phone: [phone] Subjective:     CC: Back pain follow-up, Neck pain and rest pain  [id number]  George [name] is a 51 y.o. male coming in with complaint of back discomfort.  Was found to have more of pirformis.  Patient also had more of a degenerative disc disease and has had a history of herniated disc. Patient was having more of a piriformis syndrome multiple months ago and did have an injection and responded to it fairly well. Patient has not been as active and is noticing is starting have increasing discomfort again. Patient is stating that it is even affecting sometimes to sleep. Once to stay active but finds it difficult to.  Patient is also complaining of some neck pain. Because of a dull, throbbing aching sensation. Has responded to manipulation previously in the past. Patient is a 71 is having difficulty turning his head to the right. She just thinks he probably lost slept on it incorrectly. Denies any recent injuries or falls.  Patient is also complaining of right wrist pain. No, throbbing aching pain. Does not remember any injury but states that if he flexes his wrist has pain. Points to more on the ulnar aspect of the wrist. States that most activities of daily living no pain. Tries to be active but finds it difficult. Rates the severity pain as 5 out of 10 when it occurs.  Past Medical History:  Diagnosis Date  . Allergy    SEASONAL  . Anxiety   . Fainting spell as child  . GERD (gastroesophageal reflux disease)    Past Surgical History:  Procedure Laterality Date  . NO PAST SURGERIES     Social History  Substance Use Topics  . Smoking status: Never Smoker  . Smokeless tobacco: Never Used  . Alcohol use No     Comment: ONCE A WEEK   No Known Allergies Family History  Problem Relation Age of Onset  . Obesity Father   . Asthma Father   . Hypertension Father   . Bladder Cancer  Father   . Diabetes Neg Hx   . Early death Neg Hx   . Heart disease Neg Hx   . Hyperlipidemia Neg Hx   . Kidney disease Neg Hx   . Stroke Neg Hx   . Colon cancer Maternal Grandmother          Past medical history, social, surgical and family history all reviewed in electronic medical record.   Review of Systems: No headache, visual changes, nausea, vomiting, diarrhea, constipation, dizziness, abdominal pain, skin rash, fevers, chills, night sweats, weight loss, swollen lymph nodes, body aches, joint swelling, muscle aches, chest pain, shortness of breath, mood changes.    Objective  There were no vitals taken for this visit.  Systems examined below as of 07/19/16 General: NAD A&O x3 mood, affect normal  HEENT: Pupils equal, extraocular movements intact no nystagmus Respiratory: not short of breath at rest or with speaking Cardiovascular: No lower extremity edema, non tender Skin: Warm dry intact with no signs of infection or rash on extremities or on axial skeleton. Abdomen: Soft nontender, no masses Neuro: Cranial nerves  intact, neurovascularly intact in all extremities with 2+ DTRs and 2+ pulses. Lymph: No lymphadenopathy appreciated today  Gait normal with good balance and coordination.  MSK: Non tender with full range of motion and good stability and symmetric strength and tone of shoulders, elbows, wrist,  knee  hips and ankles bilaterally.   Back Exam:  Inspection: Unremarkable  Motion: Flexion 45 deg, Extension 25 deg, Side Bending to 45 deg bilaterally,  Rotation to 45 deg bilaterally  SLR laying: Negative  XSLR laying: Negative  Palpable tenderness: Mild tenderness to palpation in the right paraspinal musculature lumbar spine in the right sacroiliac joint and right piriformis. FABER: Positive right. Sensory change: Gross sensation intact to all lumbar and sacral dermatomes.  Reflexes: 2+ at both patellar tendons, 2+ at achilles tendons, Babinski's downgoing.    Strength at foot  Plantar-flexion: 5/5 Dorsi-flexion: 5/5 Eversion: 5/5 Inversion: 5/5  Leg strength  Quad: 5/5 Hamstring: 5/5 Hip flexor: 5/5 Hip abductors: 5/5  Gait unremarkable.   Osteopathic findings C2 flexed rotated and side bent right C4 flexed rotated and side bent left T4 extended rotated and side bent right inhaled rib T6 extended rotated and side bent left L2 flexed rotated and side bent right Sacrum right on right     Impression and Recommendations:     This case required medical decision making of moderate complexity.

## 2016-07-19 ENCOUNTER — Other Ambulatory Visit (INDEPENDENT_AMBULATORY_CARE_PROVIDER_SITE_OTHER): Payer: BLUE CROSS/BLUE SHIELD

## 2016-07-19 ENCOUNTER — Encounter: Payer: Self-pay | Admitting: Family Medicine

## 2016-07-19 ENCOUNTER — Ambulatory Visit (INDEPENDENT_AMBULATORY_CARE_PROVIDER_SITE_OTHER): Payer: BLUE CROSS/BLUE SHIELD | Admitting: Family Medicine

## 2016-07-19 VITALS — BP 110/80 | HR 76 | Resp 16 | Wt 180.0 lb

## 2016-07-19 DIAGNOSIS — M999 Biomechanical lesion, unspecified: Secondary | ICD-10-CM

## 2016-07-19 DIAGNOSIS — M2550 Pain in unspecified joint: Secondary | ICD-10-CM

## 2016-07-19 DIAGNOSIS — G5701 Lesion of sciatic nerve, right lower limb: Secondary | ICD-10-CM

## 2016-07-19 DIAGNOSIS — IMO0001 Reserved for inherently not codable concepts without codable children: Secondary | ICD-10-CM | POA: Insufficient documentation

## 2016-07-19 DIAGNOSIS — S63094A Other dislocation of right wrist and hand, initial encounter: Secondary | ICD-10-CM

## 2016-07-19 LAB — CBC WITH DIFFERENTIAL/PLATELET
BASOS ABS: 0 10*3/uL (ref 0.0–0.1)
Basophils Relative: 0.5 % (ref 0.0–3.0)
Eosinophils Absolute: 0.1 10*3/uL (ref 0.0–0.7)
Eosinophils Relative: 1.1 % (ref 0.0–5.0)
HCT: 46.6 % (ref 39.0–52.0)
Hemoglobin: 16.2 g/dL (ref 13.0–17.0)
LYMPHS ABS: 2.2 10*3/uL (ref 0.7–4.0)
Lymphocytes Relative: 22.4 % (ref 12.0–46.0)
MCHC: 34.7 g/dL (ref 30.0–36.0)
MCV: 90.5 fl (ref 78.0–100.0)
MONO ABS: 0.8 10*3/uL (ref 0.1–1.0)
Monocytes Relative: 8.5 % (ref 3.0–12.0)
NEUTROS ABS: 6.5 10*3/uL (ref 1.4–7.7)
NEUTROS PCT: 67.5 % (ref 43.0–77.0)
PLATELETS: 245 10*3/uL (ref 150.0–400.0)
RBC: 5.14 Mil/uL (ref 4.22–5.81)
RDW: 12.8 % (ref 11.5–15.5)
WBC: 9.7 10*3/uL (ref 4.0–10.5)

## 2016-07-19 LAB — COMPREHENSIVE METABOLIC PANEL
ALK PHOS: 64 U/L (ref 39–117)
ALT: 17 U/L (ref 0–53)
AST: 19 U/L (ref 0–37)
Albumin: 4.8 g/dL (ref 3.5–5.2)
BILIRUBIN TOTAL: 0.5 mg/dL (ref 0.2–1.2)
BUN: 22 mg/dL (ref 6–23)
CO2: 33 meq/L — AB (ref 19–32)
Calcium: 9.9 mg/dL (ref 8.4–10.5)
Chloride: 101 mEq/L (ref 96–112)
Creatinine, Ser: 1.1 mg/dL (ref 0.40–1.50)
GFR: 75.24 mL/min (ref >60.00)
GLUCOSE: 65 mg/dL — AB (ref 70–99)
Potassium: 4 mEq/L (ref 3.5–5.1)
Sodium: 139 mEq/L (ref 135–145)
TOTAL PROTEIN: 7.8 g/dL (ref 6.0–8.3)

## 2016-07-19 LAB — SEDIMENTATION RATE: Sed Rate: 6 mm/hr (ref 0–20)

## 2016-07-19 LAB — C-REACTIVE PROTEIN: CRP: 0.1 mg/dL — ABNORMAL LOW (ref 0.5–20.0)

## 2016-07-19 LAB — IRON: IRON: 78 ug/dL (ref 42–165)

## 2016-07-19 MED ORDER — VITAMIN D (ERGOCALCIFEROL) 1.25 MG (50000 UNIT) PO CAPS: 50000.0000 [IU] | ORAL_CAPSULE | ORAL | 0 refills | Status: DC

## 2016-07-19 NOTE — Assessment & Plan Note (Signed)
Decision today to treat with OMT was based on Physical Exam  After verbal consent patient was treated with HVLA, ME, FPR techniques in cervical, thoracic, lumbar and sacral areas  Patient tolerated the procedure well with improvement in symptoms  Patient given exercises, stretches and lifestyle modifications  See medications in patient instructions if given  Patient will follow up in 4 weeks 

## 2016-07-19 NOTE — Patient Instructions (Signed)
Good to see you  George [name] is your friend.  Stay active.  Exercises 3 times a week.  For the wrist no lifting until I see you again. Wear brace day and night for 1 week and then nightly for 2 weeks.  Once weekly vitamin D for 12 weeks.  Labs downstairs today  See me again in 3 ish weeks and we will consider injection if needed.

## 2016-07-19 NOTE — Assessment & Plan Note (Signed)
I believe the patient does have more of an extensor carpi ulnaris. Potential small subluxation. Discussed with patient about the differential including an occult fracture. Discussed bracing, topical anti-inflammatories and icing. Follow-up again in 4-6 weeks

## 2016-07-19 NOTE — Assessment & Plan Note (Signed)
Worsening again at this time.  Discussed HEP, icing,core strength,  RTC in 3 weeks and consider repeat injection.

## 2016-07-20 LAB — VITAMIN D 25 HYDROXY (VIT D DEFICIENCY, FRACTURES): VITD: 51.95 ng/mL (ref 30.00–100.00)

## 2016-07-21 ENCOUNTER — Encounter: Payer: Self-pay | Admitting: Internal Medicine

## 2016-07-21 ENCOUNTER — Encounter: Payer: Self-pay | Admitting: Family Medicine

## 2016-07-21 ENCOUNTER — Other Ambulatory Visit: Payer: Self-pay

## 2016-07-21 ENCOUNTER — Ambulatory Visit (INDEPENDENT_AMBULATORY_CARE_PROVIDER_SITE_OTHER): Payer: BLUE CROSS/BLUE SHIELD | Admitting: Internal Medicine

## 2016-07-21 VITALS — BP 118/60 | HR 80 | Ht 69.0 in | Wt 180.0 lb

## 2016-07-21 DIAGNOSIS — Z800 Family history of malignant neoplasm of digestive organs: Secondary | ICD-10-CM | POA: Diagnosis not present

## 2016-07-21 DIAGNOSIS — Z1211 Encounter for screening for malignant neoplasm of colon: Secondary | ICD-10-CM

## 2016-07-21 DIAGNOSIS — K219 Gastro-esophageal reflux disease without esophagitis: Secondary | ICD-10-CM

## 2016-07-21 DIAGNOSIS — R1013 Epigastric pain: Secondary | ICD-10-CM | POA: Diagnosis not present

## 2016-07-21 MED ORDER — NA SULFATE-K SULFATE-MG SULF 17.5-3.13-1.6 GM/177ML PO SOLN: 1.0000 | Freq: Once | ORAL | 0 refills | Status: AC

## 2016-07-21 MED ORDER — HYOSCYAMINE SULFATE 0.125 MG SL SUBL: 0.1250 mg | SUBLINGUAL_TABLET | Freq: Four times a day (QID) | SUBLINGUAL | 2 refills | Status: DC | PRN

## 2016-07-21 MED ORDER — LANSOPRAZOLE 30 MG PO CPDR: 30.0000 mg | DELAYED_RELEASE_CAPSULE | Freq: Every day | ORAL | 6 refills | Status: DC

## 2016-07-21 NOTE — Patient Instructions (Signed)
We have sent the following medications to your pharmacy for you to pick up at your convenience:  [place] have been scheduled for an endoscopy and colonoscopy. Please follow the written instructions given to you at your visit today. Please pick up your prep supplies at the pharmacy within the next 1-3 days. If you use inhalers (even only as needed), please bring them with you on the day of your procedure. Your physician has requested that you go to www.startemmi.com and enter the access code given to you at your visit today. This web site gives a general overview about your procedure. However, you should still follow specific instructions given to you by our office regarding your preparation for the procedure.

## 2016-07-21 NOTE — Progress Notes (Signed)
Subjective:    Patient ID: George [name], male    DOB: 05-13-1965, 51 y.o.   MRN: [id number]  HPI George [name] is a 51 year old male with a history of GERD and family history of colon cancer (he believes his father had rectal cancer) who is seen in follow-up. He was last seen one year ago. He is maintained on Dexilant 60 mg daily. He reports this medication works well for him. He very rarely has breakthrough heartburn. He denies dysphagia and odynophagia. No nausea or vomiting. He has noticed some epigastric discomfort which she describes as a "clenched" type feeling. This is most noticeable often in the afternoon between 3 and 4 PM. Possibly worse after a big meal. Sometimes he reports small meals even helps the discomfort. It is not nocturnal. It does not wake him from sleep. It is not there when he wakes up. Does not associate this with bowel movement. Bowel movements a been regular usually daily. No diarrhea. No blood in his stool or melena. He reports since taking PPI his stools have been more regular as before he was slightly constipated. He thinks stress may affect the mid abdominal discomfort.   Review of Systems As per history of present illness, otherwise negative  Current Medications, Allergies, Past Medical History, Past Surgical History, Family History and Social History were reviewed in [employer] record.     Objective:   Physical Exam BP 118/60   Pulse 80   Ht 5\' 9"  (1.753 m)   Wt 180 lb (81.6 kg)   BMI 26.58 kg/m  Constitutional: Well-developed and well-nourished. No distress. HEENT: Normocephalic and atraumatic. Oropharynx is clear and moist. Conjunctivae are normal.  No scleral icterus. Neck: Neck supple. Trachea midline. Cardiovascular: Normal rate, regular rhythm and intact distal pulses. No M/R/G Pulmonary/chest: Effort normal and breath sounds normal. No wheezing, rales or rhonchi. Abdominal: Soft, Mildly tender diffusely without rebound or  guarding, nondistended. Bowel sounds active throughout. There are no masses palpable. No hepatosplenomegaly. Extremities: no clubbing, cyanosis, or edema Neurological: Alert and oriented to person place and time. Skin: Skin is warm and dry. Psychiatric: Normal mood and affect. Behavior is normal.  CBC    Component Value Date/Time   WBC 9.7 07/19/2016 1622   RBC 5.14 07/19/2016 1622   HGB 16.2 07/19/2016 1622   HCT 46.6 07/19/2016 1622   PLT 245.0 07/19/2016 1622   MCV 90.5 07/19/2016 1622   MCHC 34.7 07/19/2016 1622   RDW 12.8 07/19/2016 1622   LYMPHSABS 2.2 07/19/2016 1622   MONOABS 0.8 07/19/2016 1622   EOSABS 0.1 07/19/2016 1622   BASOSABS 0.0 07/19/2016 1622   CMP     Component Value Date/Time   NA 139 07/19/2016 1622   K 4.0 07/19/2016 1622   CL 101 07/19/2016 1622   CO2 33 (H) 07/19/2016 1622   GLUCOSE 65 (L) 07/19/2016 1622   BUN 22 07/19/2016 1622   CREATININE 1.10 07/19/2016 1622   CALCIUM 9.9 07/19/2016 1622   PROT 7.8 07/19/2016 1622   ALBUMIN 4.8 07/19/2016 1622   AST 19 07/19/2016 1622   ALT 17 07/19/2016 1622   ALKPHOS 64 07/19/2016 1622   BILITOT 0.5 07/19/2016 1622   GFRNONAA 65 12/05/2007 1039   GFRAA 78 12/05/2007 1039       Assessment & Plan:  51 year old male with a history of GERD and family history of colon cancer (he believes his father had rectal cancer) who is seen in follow-up.  1. Colon cancer screening/family  history of colon cancer -- colonoscopy recommended for screening. We discussed the risks, benefits and alternatives and he wishes to proceed.  2. Epigastric discomfort -- this may be a spastic type pain and more related to irritable bowel. He is concerned about an ulcer but I think this is unlikely. We will perform upper endoscopy at the same time as colonoscopy to further evaluate this symptom. Plan biopsies for H. pylori. We discussed the risk benefits and alternatives and he wishes to proceed. Trial of Levsin 0.125 mg sublingual 1-2  tabs every 6 hours as needed for abdominal discomfort/spasm  3. GERD -- The heartburn/reflux component is well controlled PPI. No dysphagia. Continue lansoprazole 30 mg once daily. We discussed the risks, benefits and alternatives to this medication and he wishes to continue it.  25 minutes spent with the patient today. Greater than 50% was spent in counseling and coordination of care with the patient

## 2016-07-22 ENCOUNTER — Telehealth: Payer: Self-pay | Admitting: Internal Medicine

## 2016-07-22 NOTE — Telephone Encounter (Signed)
Dr Hilarie Fredrickson- Should we try for dicyclomine instead?

## 2016-07-23 MED ORDER — DICYCLOMINE HCL 20 MG PO TABS: 20.0000 mg | ORAL_TABLET | Freq: Three times a day (TID) | ORAL | 2 refills | Status: DC | PRN

## 2016-07-23 NOTE — Telephone Encounter (Signed)
Yes 20 mg TID PRN

## 2016-07-23 NOTE — Telephone Encounter (Signed)
Rx sent for dicyclomine in place of hyoscyamine. Left message advising patient.

## 2016-07-28 ENCOUNTER — Other Ambulatory Visit: Payer: Self-pay | Admitting: *Deleted

## 2016-07-28 MED ORDER — LANSOPRAZOLE 30 MG PO CPDR: 30.0000 mg | DELAYED_RELEASE_CAPSULE | Freq: Every day | ORAL | 1 refills | Status: DC

## 2016-08-01 ENCOUNTER — Other Ambulatory Visit: Payer: Self-pay | Admitting: Family Medicine

## 2016-08-05 ENCOUNTER — Encounter: Payer: Self-pay | Admitting: Family Medicine

## 2016-08-05 ENCOUNTER — Ambulatory Visit (INDEPENDENT_AMBULATORY_CARE_PROVIDER_SITE_OTHER): Payer: BLUE CROSS/BLUE SHIELD | Admitting: Family Medicine

## 2016-08-05 ENCOUNTER — Ambulatory Visit: Payer: Self-pay

## 2016-08-05 VITALS — BP 110/72 | HR 59 | Ht 68.0 in

## 2016-08-05 DIAGNOSIS — M999 Biomechanical lesion, unspecified: Secondary | ICD-10-CM

## 2016-08-05 DIAGNOSIS — M5416 Radiculopathy, lumbar region: Secondary | ICD-10-CM

## 2016-08-05 DIAGNOSIS — G5701 Lesion of sciatic nerve, right lower limb: Secondary | ICD-10-CM

## 2016-08-05 MED ORDER — PREDNISONE 50 MG PO TABS: 50.0000 mg | ORAL_TABLET | Freq: Every day | ORAL | 0 refills | Status: DC

## 2016-08-05 NOTE — Assessment & Plan Note (Signed)
Stable at this time. Discussed then if any radicular symptoms occur we may need to consider different medications again that further imaging. Patient responded well to the piriformis injection.

## 2016-08-05 NOTE — Assessment & Plan Note (Signed)
Decision today to treat with OMT was based on Physical Exam  After verbal consent patient was treated with HVLA, ME, FPR techniques in cervical, thoracic, lumbar and sacral areas  Patient tolerated the procedure well with improvement in symptoms  Patient given exercises, stretches and lifestyle modifications  See medications in patient instructions if given  Patient will follow up in 8 weeks 

## 2016-08-05 NOTE — Assessment & Plan Note (Signed)
Repeat injection given today. Tolerated the procedure well. We discussed icing regimen and home exercises. Discussed which activities doing which ones to avoid. Increase activity as tolerated. Follow-up again in 8 weeks

## 2016-08-05 NOTE — Progress Notes (Signed)
[name] [place], [place] [address] Phone: [phone] Subjective:     CC: Back pain follow-up, Neck pain and rest pain  [id number]  George [name] is a 51 y.o. male coming in with complaint of back discomfort.  Was found to have more of pirformis.  Patient also had more of a degenerative disc disease and has had a history of herniated disc. Patient is leaving town for the next 2 months. Concern that he can have more radicular symptoms again. Has been doing the exercises regularly which does keep it to a dull 4. Still not pain free though.  Patient is also complaining of some neck pain. Has responded fairly well to osteopathic manipulation. Has been doing more with her his posture. Has been not lifting as much and thinks that that has been helpful. Still running fairly regularly now.  Patient is also complaining of right wrist pain. Patient was having wrist pain and was put in a splint. Doing much better after wearing them. Very mild discomfort.  Past Medical History:  Diagnosis Date  . Allergy    SEASONAL  . Anxiety   . Fainting spell as child  . GERD (gastroesophageal reflux disease)    Past Surgical History:  Procedure Laterality Date  . NO PAST SURGERIES     Social History  Substance Use Topics  . Smoking status: Never Smoker  . Smokeless tobacco: Never Used  . Alcohol use No     Comment: ONCE A WEEK   No Known Allergies Family History  Problem Relation Age of Onset  . Obesity Father   . Asthma Father   . Hypertension Father   . Bladder Cancer Father   . Colon cancer Maternal Grandmother   . Diabetes Neg Hx   . Early death Neg Hx   . Heart disease Neg Hx   . Hyperlipidemia Neg Hx   . Kidney disease Neg Hx   . Stroke Neg Hx          Past medical history, social, surgical and family history all reviewed in electronic medical record.   Review of Systems: No headache, visual changes, nausea, vomiting, diarrhea,  constipation, dizziness, abdominal pain, skin rash, fevers, chills, night sweats, weight loss, swollen lymph nodes, body aches, joint swelling,  chest pain, shortness of breath, mood changes.  Positive muscle aches   Objective  Blood pressure 110/72, pulse (!) 59, height 5\' 8"  (1.727 m), SpO2 95 %.  Systems examined below as of 08/05/16 General: NAD A&O x3 mood, affect normal  HEENT: Pupils equal, extraocular movements intact no nystagmus Respiratory: not short of breath at rest or with speaking Cardiovascular: No lower extremity edema, non tender Skin: Warm dry intact with no signs of infection or rash on extremities or on axial skeleton. Abdomen: Soft nontender, no masses Neuro: Cranial nerves  intact, neurovascularly intact in all extremities with 2+ DTRs and 2+ pulses. Lymph: No lymphadenopathy appreciated today  Gait normal with good balance and coordination.  MSK: Non tender with full range of motion and good stability and symmetric strength and tone of shoulders, elbows, wrist,  knee hips and ankles bilaterally.   Back Exam:  Inspection: Unremarkable  Motion: Flexion 45 deg, Extension 25 deg, Side Bending to 45 deg bilaterally,  Rotation to 45 deg bilaterally  SLR laying: Negative  XSLR laying: Negative  Palpable tenderness: More tenderness over the right piriformis. FABER: Positive right. Sensory change: Gross sensation intact to all lumbar and sacral  dermatomes.  Reflexes: 2+ at both patellar tendons, 2+ at achilles tendons, Babinski's downgoing.  Strength at foot  Plantar-flexion: 5/5 Dorsi-flexion: 5/5 Eversion: 5/5 Inversion: 5/5  Leg strength  Quad: 5/5 Hamstring: 5/5 Hip flexor: 5/5 Hip abductors: 5/5  Gait unremarkable.  Osteopathic findings C2 flexed rotated and side bent right C5 flexed rotated and side bent left T3 extended rotated and side bent right T9 extended rotated and side bent left L1 flexed rotated and side bent right Sacrum right on  right   Procedure: Real-time Ultrasound Guided Injection of right piriformis tendon sheath Device: GE Logiq Q7 Ultrasound guided injection is preferred based studies that show increased duration, increased effect, greater accuracy, decreased procedural pain, increased response rate, and decreased cost with ultrasound guided versus blind injection.  Verbal informed consent obtained.  Time-out conducted.  Noted no overlying erythema, induration, or other signs of local infection.  Skin prepped in a sterile fashion.  Local anesthesia: Topical Ethyl chloride.  With sterile technique and under real time ultrasound guidance:  With a 21-gauge 3 inch needle patient was injected with a total of 2 mL of 0.5% Marcaine and 1 mL of Kenalog 40 mg/dL within the piriformis tendon sheath of the right side Completed without difficulty  Pain immediately resolved suggesting accurate placement of the medication.  Advised to call if fevers/chills, erythema, induration, drainage, or persistent bleeding.  Images permanently stored and available for review in the ultrasound unit.  Impression: Technically successful ultrasound guided injection.   Impression and Recommendations:     This case required medical decision making of moderate complexity.

## 2016-08-05 NOTE — Patient Instructions (Addendum)
Good to see you  Alvera Singh is your friend.  Labs looked great  Injected the piriformis injection today  You are doing good.  Have a great summer See you in august!

## 2016-08-11 ENCOUNTER — Encounter: Payer: Self-pay | Admitting: Internal Medicine

## 2016-08-11 ENCOUNTER — Ambulatory Visit: Payer: BLUE CROSS/BLUE SHIELD | Admitting: Family Medicine

## 2016-08-26 ENCOUNTER — Encounter: Payer: BLUE CROSS/BLUE SHIELD | Admitting: Internal Medicine

## 2016-09-23 ENCOUNTER — Encounter: Payer: Self-pay | Admitting: Internal Medicine

## 2016-11-09 ENCOUNTER — Ambulatory Visit (AMBULATORY_SURGERY_CENTER): Payer: Self-pay | Admitting: *Deleted

## 2016-11-09 VITALS — Ht 69.0 in | Wt 180.0 lb

## 2016-11-09 DIAGNOSIS — Z1211 Encounter for screening for malignant neoplasm of colon: Secondary | ICD-10-CM

## 2016-11-09 DIAGNOSIS — R1013 Epigastric pain: Secondary | ICD-10-CM

## 2016-11-09 MED ORDER — NA SULFATE-K SULFATE-MG SULF 17.5-3.13-1.6 GM/177ML PO SOLN: 1.0000 | Freq: Once | ORAL | 0 refills | Status: AC

## 2016-11-09 NOTE — Progress Notes (Signed)
No egg or soy allergy known to patient  No issues with past sedation with any surgeries  or procedures, no intubation problems  No diet pills per patient No home 02 use per patient  No blood thinners per patient  Pt denies issues with constipation  No A fib or A flutter  EMMI video sent to pt's e mail pt declined   

## 2016-11-11 ENCOUNTER — Other Ambulatory Visit: Payer: Self-pay | Admitting: Family Medicine

## 2016-11-17 ENCOUNTER — Encounter: Payer: Self-pay | Admitting: Internal Medicine

## 2016-11-23 ENCOUNTER — Encounter: Payer: BLUE CROSS/BLUE SHIELD | Admitting: Internal Medicine

## 2016-11-29 ENCOUNTER — Encounter: Payer: Self-pay | Admitting: Internal Medicine

## 2016-11-29 ENCOUNTER — Ambulatory Visit (AMBULATORY_SURGERY_CENTER): Payer: BLUE CROSS/BLUE SHIELD | Admitting: Internal Medicine

## 2016-11-29 VITALS — BP 97/63 | HR 54 | Temp 98.2°F | Resp 16 | Ht 69.0 in | Wt 180.0 lb

## 2016-11-29 DIAGNOSIS — D122 Benign neoplasm of ascending colon: Secondary | ICD-10-CM | POA: Diagnosis not present

## 2016-11-29 DIAGNOSIS — Z1211 Encounter for screening for malignant neoplasm of colon: Secondary | ICD-10-CM

## 2016-11-29 DIAGNOSIS — R1013 Epigastric pain: Secondary | ICD-10-CM

## 2016-11-29 MED ORDER — SODIUM CHLORIDE 0.9 % IV SOLN: 500.0000 mL | INTRAVENOUS | Status: DC

## 2016-11-29 NOTE — Progress Notes (Signed)
Called to room to assist during endoscopic procedure.  Patient ID and intended procedure confirmed with present staff. Received instructions for my participation in the procedure from the performing physician.  

## 2016-11-29 NOTE — Patient Instructions (Signed)
Impression/Recommendations:  Polyp handout given to patient.  Repeat colonoscopy recommended for surveillance.  Date to be determined after pathology results reviewed.  YOU HAD AN ENDOSCOPIC PROCEDURE TODAY AT [facility]:   Refer to the procedure report that was given to you for any specific questions about what was found during the examination.  If the procedure report does not answer your questions, please call your gastroenterologist to clarify.  If you requested that your care partner not be given the details of your procedure findings, then the procedure report has been included in a sealed envelope for you to review at your convenience later.  YOU SHOULD EXPECT: Some feelings of bloating in the abdomen. Passage of more gas than usual.  Walking can help get rid of the air that was put into your GI tract during the procedure and reduce the bloating. If you had a lower endoscopy (such as a colonoscopy or flexible sigmoidoscopy) you may notice spotting of blood in your stool or on the toilet paper. If you underwent a bowel prep for your procedure, you may not have a normal bowel movement for a few days.  Please Note:  You might notice some irritation and congestion in your nose or some drainage.  This is from the oxygen used during your procedure.  There is no need for concern and it should clear up in a day or so.  SYMPTOMS TO REPORT IMMEDIATELY:   Following lower endoscopy (colonoscopy or flexible sigmoidoscopy):  Excessive amounts of blood in the stool  Significant tenderness or worsening of abdominal pains  Swelling of the abdomen that is new, acute  Fever of 100F or higher   Following upper endoscopy (EGD)  Vomiting of blood or coffee ground material  New chest pain or pain under the shoulder blades  Painful or persistently difficult swallowing  New shortness of breath  Fever of 100F or higher  Black, tarry-looking stools  For urgent or emergent issues, a  gastroenterologist can be reached at any hour by calling [phone].   DIET:  We do recommend a small meal at first, but then you may proceed to your regular diet.  Drink plenty of fluids but you should avoid alcoholic beverages for 24 hours.  ACTIVITY:  You should plan to take it easy for the rest of today and you should NOT DRIVE or use heavy machinery until tomorrow (because of the sedation medicines used during the test).    FOLLOW UP: Our staff will call the number listed on your records the next business day following your procedure to check on you and address any questions or concerns that you may have regarding the information given to you following your procedure. If we do not reach you, we will leave a message.  However, if you are feeling well and you are not experiencing any problems, there is no need to return our call.  We will assume that you have returned to your regular daily activities without incident.  If any biopsies were taken you will be contacted by phone or by letter within the next 1-3 weeks.  Please call us at [phone] if you have not heard about the biopsies in 3 weeks.    SIGNATURES/CONFIDENTIALITY: You and/or your care partner have signed paperwork which will be entered into your electronic medical record.  These signatures attest to the fact that that the information above on your After Visit Summary has been reviewed and is understood.  Full responsibility of the  confidentiality of this discharge information lies with you and/or your care-partner. 

## 2016-11-29 NOTE — Progress Notes (Signed)
Report given to PACU, vss 

## 2016-11-30 ENCOUNTER — Telehealth: Payer: Self-pay | Admitting: *Deleted

## 2016-11-30 NOTE — Telephone Encounter (Signed)
  Follow up Call-  Call back number 11/29/2016  Post procedure Call Back phone  # [phone]  Permission to leave phone message Yes  Some recent data might be hidden     Patient questions:  Do you have a fever, pain , or abdominal swelling? No. Pain Score  0 *  Have you tolerated food without any problems? Yes.    Have you been able to return to your normal activities? Yes.    Do you have any questions about your discharge instructions: Diet   No. Medications  No. Follow up visit  No.  Do you have questions or concerns about your Care? No.  Actions: * If pain score is 4 or above: No action needed, pain <4.

## 2016-12-01 LAB — HM COLONOSCOPY

## 2016-12-02 LAB — HM COLONOSCOPY

## 2016-12-02 NOTE — Op Note (Signed)
[place] Patient Name: George [name] Procedure Date: 11/29/2016 2:17 PM MRN: [id number] Endoscopist: Jerene Bears , MD Age: 51 Referring MD:  Date of Birth: 1965/11/16 Gender: Male Account #: [id number] Procedure:                Colonoscopy Indications:              Screening for colorectal malignant neoplasm, This                            is the patient's first colonoscopy Medicines:                Monitored Anesthesia Care Procedure:                Pre-Anesthesia Assessment:                           - Prior to the procedure, a History and Physical                            was performed, and patient medications and                            allergies were reviewed. The patient's tolerance of                            previous anesthesia was also reviewed. The risks                            and benefits of the procedure and the sedation                            options and risks were discussed with the patient.                            All questions were answered, and informed consent                            was obtained. Prior Anticoagulants: The patient has                            taken no previous anticoagulant or antiplatelet                            agents. ASA Grade Assessment: II - A patient with                            mild systemic disease. After reviewing the risks                            and benefits, the patient was deemed in                            satisfactory condition to undergo the procedure.  After obtaining informed consent, the colonoscope                            was passed under direct vision. Throughout the                            procedure, the patient's blood pressure, pulse, and                            oxygen saturations were monitored continuously. The                            Colonoscope was introduced through the anus and                            advanced to the the cecum,  identified by                            appendiceal orifice and ileocecal valve. The                            colonoscopy was performed without difficulty. The                            patient tolerated the procedure well. The quality                            of the bowel preparation was good. The ileocecal                            valve, appendiceal orifice, and rectum were                            photographed. Scope In: 2:37:12 PM Scope Out: 2:50:51 PM Scope Withdrawal Time: 0 hours 10 minutes 13 seconds  Total Procedure Duration: 0 hours 13 minutes 39 seconds  Findings:                 The perianal and digital rectal examinations were                            normal. [Pertinent Negatives].                           Two sessile polyps were found in the ascending                            colon. The polyps were 3 to 5 mm in size. These                            polyps were removed with a cold snare. Resection                            and retrieval were complete.  Internal hemorrhoids were found during                            retroflexion. The hemorrhoids were small.                           The exam was otherwise without abnormality. Complications:            No immediate complications. Estimated Blood Loss:     Estimated blood loss was minimal. Impression:               - Two 3 to 5 mm polyps in the ascending colon,                            removed with a cold snare. Resected and retrieved.                           - Small internal hemorrhoids.                           - The examination was otherwise normal. Recommendation:           - Patient has a contact number available for                            emergencies. The signs and symptoms of potential                            delayed complications were discussed with the                            patient. Return to normal activities tomorrow.                            Written  discharge instructions were provided to the                            patient.                           - Resume previous diet.                           - Continue present medications.                           - Await pathology results.                           - Repeat colonoscopy is recommended. The                            colonoscopy date will be determined after pathology                            results from today's exam become available for  review. Jerene Bears, MD 12/02/2016 4:57:04 PM This report has been signed electronically.

## 2016-12-02 NOTE — Op Note (Signed)
[place] Patient Name: George [name] Procedure Date: 11/29/2016 2:17 PM MRN: [id number] Endoscopist: Jerene Bears , MD Age: 51 Referring MD:  Date of Birth: [birth year] Gender: Male Account #: [id number] Procedure:                Upper GI endoscopy Indications:              Epigastric abdominal pain Medicines:                Monitored Anesthesia Care Procedure:                Pre-Anesthesia Assessment:                           - Prior to the procedure, a History and Physical                            was performed, and patient medications and                            allergies were reviewed. The patient's tolerance of                            previous anesthesia was also reviewed. The risks                            and benefits of the procedure and the sedation                            options and risks were discussed with the patient.                            All questions were answered, and informed consent                            was obtained. Prior Anticoagulants: The patient has                            taken no previous anticoagulant or antiplatelet                            agents. ASA Grade Assessment: II - A patient with                            mild systemic disease. After reviewing the risks                            and benefits, the patient was deemed in                            satisfactory condition to undergo the procedure.                           After obtaining informed consent, the endoscope was  passed under direct vision. Throughout the                            procedure, the patient's blood pressure, pulse, and                            oxygen saturations were monitored continuously. The                            Model GIF-HQ190 [id number]) scope was introduced                            through the mouth, and advanced to the second part                            of duodenum. The upper GI endoscopy  was                            accomplished without difficulty. The patient                            tolerated the procedure well. Scope In: Scope Out: Findings:                 The examined esophagus was normal.                           The entire examined stomach was normal. Biopsies                            were taken with a cold forceps for histology and                            Helicobacter pylori testing.                           The examined duodenum was normal. Complications:            No immediate complications. Estimated Blood Loss:     Estimated blood loss was minimal. Impression:               - Normal esophagus.                           - Normal stomach. Biopsied to exclude H. Pylori                            given history of epigastric pain.                           - Normal examined duodenum. Recommendation:           - Patient has a contact number available for                            emergencies. The signs and symptoms of potential  delayed complications were discussed with the                            patient. Return to normal activities tomorrow.                            Written discharge instructions were provided to the                            patient.                           - Resume previous diet.                           - Continue present medications.                           - Await pathology results. Jerene Bears, MD 12/02/2016 4:49:36 PM This report has been signed electronically.

## 2016-12-06 ENCOUNTER — Other Ambulatory Visit: Payer: Self-pay | Admitting: Internal Medicine

## 2016-12-06 DIAGNOSIS — R1013 Epigastric pain: Secondary | ICD-10-CM

## 2016-12-22 ENCOUNTER — Encounter: Payer: Self-pay | Admitting: Family Medicine

## 2016-12-22 ENCOUNTER — Ambulatory Visit (INDEPENDENT_AMBULATORY_CARE_PROVIDER_SITE_OTHER): Payer: BLUE CROSS/BLUE SHIELD | Admitting: Family Medicine

## 2016-12-22 VITALS — BP 110/70 | HR 76 | Ht 68.0 in | Wt 177.0 lb

## 2016-12-22 DIAGNOSIS — G5701 Lesion of sciatic nerve, right lower limb: Secondary | ICD-10-CM

## 2016-12-22 DIAGNOSIS — M999 Biomechanical lesion, unspecified: Secondary | ICD-10-CM | POA: Diagnosis not present

## 2016-12-22 NOTE — Patient Instructions (Signed)
Good to see you  Ice 20 minutes 2 times daily. Usually after activity and before bed.' Wyatt Mage Duexis 3 times a dya for 3 days  Exercises 3 times a week.  See me again in 5-6 weeks.

## 2016-12-22 NOTE — Progress Notes (Signed)
[name] [place], [place] [address] Phone: [phone] Subjective:      CC: Right lower back pain  [id number]  George [name] is a 51 y.o. male coming in with complaint of lower back hip pain. Pain and tightness on the right side. Right lower back pain. Describes the pain as a dull, throbbing aching sensation. States that he does not want have as much radicular symptoms as these had previously in the past.      Past Medical History:  Diagnosis Date  . Allergy    SEASONAL  . Anxiety   . Fainting spell as child  . GERD (gastroesophageal reflux disease)    Past Surgical History:  Procedure Laterality Date  . UPPER GASTROINTESTINAL ENDOSCOPY     Social History   Social History  . Marital status: Married    Spouse name: N/A  . Number of children: 2  . Years of education: N/A   Occupational History  . Prof     [employer]  .  [employer]   Social History Main Topics  . Smoking status: Never Smoker  . Smokeless tobacco: Never Used  . Alcohol use Yes     Comment: occasional   . Drug use: No  . Sexual activity: Yes   Other Topics Concern  . Not on file   Social History Narrative   Regular exercise-yes   No Known Allergies Family History  Problem Relation Age of Onset  . Obesity Father   . Asthma Father   . Hypertension Father   . Bladder Cancer Father   . Colon polyps Mother   . Colon cancer Maternal Grandmother   . Diabetes Neg Hx   . Early death Neg Hx   . Heart disease Neg Hx   . Hyperlipidemia Neg Hx   . Kidney disease Neg Hx   . Stroke Neg Hx   . Esophageal cancer Neg Hx   . Stomach cancer Neg Hx      Past medical history, social, surgical and family history all reviewed in electronic medical record.  No pertanent information unless stated regarding to the chief complaint.   Review of Systems:Review of systems updated and as accurate as of 12/22/16  No headache, visual changes,  nausea, vomiting, diarrhea, constipation, dizziness, abdominal pain, skin rash, fevers, chills, night sweats, weight loss, swollen lymph nodes, body aches, joint swelling, muscle aches, chest pain, shortness of breath, mood changes.   Objective  There were no vitals taken for this visit. Systems examined below as of 12/22/16   General: No apparent distress alert and oriented x3 mood and affect normal, dressed appropriately.  HEENT: Pupils equal, extraocular movements intact  Respiratory: Patient's speak in full sentences and does not appear short of breath  Cardiovascular: No lower extremity edema, non tender, no erythema  Skin: Warm dry intact with no signs of infection or rash on extremities or on axial skeleton.  Abdomen: Soft nontender  Neuro: Cranial nerves II through XII are intact, neurovascularly intact in all extremities with 2+ DTRs and 2+ pulses.  Lymph: No lymphadenopathy of posterior or anterior cervical chain or axillae bilaterally.  Gait normal with good balance and coordination.  MSK:  Non tender with full range of motion and good stability and symmetric strength and tone of shoulders, elbows, wrist, hip, knee and ankles bilaterally.  Back Exam:  Inspection: Unremarkable  Motion: Flexion 40 deg, Extension 25 deg, Side Bending to 35 deg bilaterally,  Rotation to 35 deg bilaterally  SLR laying: Negative  XSLR laying: Negative  Palpable tenderness: Tender to palpation the paraspinal musculature lumbar spine right greater left. More pain of the piriformis.Marland Kitchen FABER: Positive right. Sensory change: Gross sensation intact to all lumbar and sacral dermatomes.  Reflexes: 2+ at both patellar tendons, 2+ at achilles tendons, Babinski's downgoing.  Strength at foot  Plantar-flexion: 5/5 Dorsi-flexion: 5/5 Eversion: 5/5 Inversion: 5/5  Leg strength  Quad: 5/5 Hamstring: 5/5 Hip flexor: 5/5 Hip abductors: 4/5 but symmetric \ Osteopathic findings C6 flexed rotated and side bent  left T3 extended rotated and side bent right inhaled third rib T7 extended rotated and side bent left L2 flexed rotated and side bent right Sacrum right on right  Procedure note 88916; 15 additional minutes spent for Therapeutic exercises as stated in above notes.  This included exercises focusing on stretching, strengthening, with significant focus on eccentric aspects.   Long term goals include an improvement in range of motion, strength, endurance as well as avoiding reinjury. Patient's frequency would include in 1-2 times a day, 3-5 times a week for a duration of 6-12 weeks.  Proper technique shown and discussed handout in great detail with ATC.  All questions were discussed and answered.       Impression and Recommendations:     This case required medical decision making of moderate complexity.      Note: This dictation was prepared with Dragon dictation along with smaller phrase technology. Any transcriptional errors that result from this process are unintentional.

## 2016-12-22 NOTE — Assessment & Plan Note (Signed)
Decision today to treat with OMT was based on Physical Exam  After verbal consent patient was treated with HVLA, ME, FPR techniques in cervical, thoracic, lumbar and sacral areas  Patient tolerated the procedure well with improvement in symptoms  Patient given exercises, stretches and lifestyle modifications  See medications in patient instructions if given  Patient will follow up in 4-6 weeks 

## 2016-12-22 NOTE — Assessment & Plan Note (Signed)
I believe that is likely more secondary to the piriformis syndrome given patient's response to this point and negative straight leg test. Some likely tightness. We can always encourage take the gabapentin on a more regular basis. Starting manipulation again and we'll see if be beneficial. We discussed working conditions and ergonomics. Follow-up again in 4-6 weeks.

## 2016-12-25 ENCOUNTER — Encounter: Payer: Self-pay | Admitting: Family Medicine

## 2016-12-27 MED ORDER — IBUPROFEN-FAMOTIDINE 800-26.6 MG PO TABS
1.0000 | ORAL_TABLET | Freq: Three times a day (TID) | ORAL | 3 refills | Status: DC | PRN
Start: 2016-12-27 — End: 2017-01-12

## 2017-01-10 ENCOUNTER — Ambulatory Visit (INDEPENDENT_AMBULATORY_CARE_PROVIDER_SITE_OTHER): Payer: BLUE CROSS/BLUE SHIELD | Admitting: Family Medicine

## 2017-01-10 ENCOUNTER — Encounter: Payer: Self-pay | Admitting: Family Medicine

## 2017-01-10 VITALS — BP 118/72 | HR 58 | Temp 98.1°F | Ht 68.0 in | Wt 177.4 lb

## 2017-01-10 DIAGNOSIS — G4700 Insomnia, unspecified: Secondary | ICD-10-CM

## 2017-01-10 DIAGNOSIS — E538 Deficiency of other specified B group vitamins: Secondary | ICD-10-CM

## 2017-01-10 DIAGNOSIS — K219 Gastro-esophageal reflux disease without esophagitis: Secondary | ICD-10-CM | POA: Diagnosis not present

## 2017-01-10 DIAGNOSIS — M62838 Other muscle spasm: Secondary | ICD-10-CM | POA: Diagnosis not present

## 2017-01-10 DIAGNOSIS — R252 Cramp and spasm: Secondary | ICD-10-CM | POA: Diagnosis not present

## 2017-01-10 MED ORDER — DIAZEPAM 5 MG PO TABS: 5.0000 mg | ORAL_TABLET | Freq: Every evening | ORAL | 1 refills | Status: DC | PRN

## 2017-01-10 NOTE — Progress Notes (Signed)
George [name] is a 51 y.o. male is here to [place].   Patient Care Team: Briscoe Deutscher, DO as PCP - General (Family Medicine)   History of Present Illness:   George [name], CMA, acting as scribe for Dr. Juleen [name].  HPI:  See Assessment and Plan section for Problem Based Charting of issues discussed today.  Health Maintenance Due  Topic Date Due  . HIV Screening  04/21/1981   Depression screen PHQ 2/9 01/10/2017  Decreased Interest 0  Down, Depressed, Hopeless 0  PHQ - 2 Score 0   PMHx, SurgHx, SocialHx, Medications, and Allergies were reviewed in the Visit Navigator and updated as appropriate.   Past Medical History:  Diagnosis Date  . Anxiety   . GERD (gastroesophageal reflux disease)   . Seasonal allergies    Past Surgical History:  Procedure Laterality Date  . UPPER GASTROINTESTINAL ENDOSCOPY     Family History  Problem Relation Age of Onset  . Obesity Father   . Asthma Father   . Hypertension Father   . Bladder Cancer Father   . Colon polyps Mother   . Colon cancer Maternal Grandmother   . Diabetes Neg Hx   . Early death Neg Hx   . Heart disease Neg Hx   . Hyperlipidemia Neg Hx   . Kidney disease Neg Hx   . Stroke Neg Hx   . Esophageal cancer Neg Hx   . Stomach cancer Neg Hx    Social History   Tobacco Use  . Smoking status: Never Smoker  . Smokeless tobacco: Never Used  Substance Use Topics  . Alcohol use: Yes    Comment: occasional   . Drug use: No   Current Medications and Allergies:   .  cholecalciferol (VITAMIN D) 1000 UNITS tablet, Take 1,000 Units by mouth daily. Take 2 pills daily, Disp: , Rfl:  .  Dexlansoprazole (DEXILANT PO), Take by mouth daily. Unsure of mg, Disp: , Rfl:  .  fluticasone (FLONASE) 50 MCG/ACT nasal spray, Place 2 sprays into both nostrils daily., Disp: 16 g, Rfl: 11 .  gabapentin (NEURONTIN) 100 MG capsule, TAKE 2 CAPSULES (200 MG TOTAL) BY MOUTH AT BEDTIME., Disp: 180 capsule, Rfl: 0 .  Ibuprofen-Famotidine  800-26.6 MG TABS, Take 1 tablet by mouth 3 (three) times daily as needed., Disp: 90 tablet, Rfl: 3 .  Melatonin 5 MG TABS, Take by mouth., Disp: , Rfl:  .  OVER THE COUNTER MEDICATION, Take 1 tablet by mouth 1 day or 1 dose., Disp: , Rfl:  .  TURMERIC PO, Take by mouth 2 (two) times daily., Disp: , Rfl:   No Known Allergies   Review of Systems:   Pertinent items are noted in the HPI. Otherwise, ROS is negative.  Vitals:   Vitals:   01/10/17 0918  BP: 118/72  Pulse: (!) 58  Temp: 98.1 F (36.7 C)  TempSrc: Oral  SpO2: 98%  Weight: 177 lb 6.4 oz (80.5 kg)  Height: 5' 8"  (1.727 m)     Body mass index is 26.97 kg/m.   Physical Exam:   Physical Exam  Constitutional: He is oriented to person, place, and time. He appears well-developed and well-nourished. No distress.  HENT:  Head: Normocephalic and atraumatic.  Right Ear: External ear normal.  Left Ear: External ear normal.  Nose: Nose normal.  Mouth/Throat: Oropharynx is clear and moist.  Eyes: Pupils are equal, round, and reactive to light. Conjunctivae and EOM are normal.  Neck: Normal range of motion.  Neck supple.  Cardiovascular: Normal rate, regular rhythm, normal heart sounds and intact distal pulses.   Pulmonary/Chest: Effort normal and breath sounds normal.  Abdominal: Soft. Bowel sounds are normal.  Musculoskeletal: Normal range of motion.  Neurological: He is alert and oriented to person, place, and time.  Skin: Skin is warm and dry.  Psychiatric: He has a normal mood and affect. His behavior is normal. Judgment and thought content normal.  Nursing note and vitals reviewed.  Assessment and Plan:   [name] was seen today for establish care.  Diagnoses and all orders for this visit:  B12 deficiency Comments: Hx of deficiency associated with fatigue and muscle spasms. Recheck today. Orders: -     Cancel: B12 -     Vitamin B12; Future -     Comprehensive metabolic panel; Future -     Ferritin;  Future  Cramps of lower extremity Comments: Exercises regularly. Followed by Sports Med. Reviewed stretches and symptomatic care.  Orders: -     Cancel: Ferritin -     Cancel: Comp Met (CMET) -     Vitamin B12; Future -     Comprehensive metabolic panel; Future -     Ferritin; Future  Gastroesophageal reflux disease, esophagitis presence not specified Comments: Continue PPI. Gastrointestinal ROS: no abdominal pain, change in bowel habits, or black or bloody stools.  Muscle spasms of neck Comments: Ongoing. Nothing seems to make better. Causes some headaches. Stretches reviewed. Patient tense at night. Trial low dose Valium to use sparingly.  Orders: -     diazepam (VALIUM) 5 MG tablet; Take 1 tablet (5 mg total) by mouth at bedtime as needed for muscle spasms.  Insomnia, unspecified type Comments: Sleep hygeine reviewed.   . Reviewed expectations re: course of current medical issues. . Discussed self-management of symptoms. . Outlined signs and symptoms indicating need for more acute intervention. . Patient verbalized understanding and all questions were answered. Marland Kitchen Health Maintenance issues including appropriate healthy diet, exercise, and smoking avoidance were discussed with patient. . See orders for this visit as documented in the electronic medical record. . Patient received an After Visit Summary.   Briscoe Deutscher, DO [place], [place] [facility] 01/16/2017

## 2017-01-12 ENCOUNTER — Other Ambulatory Visit: Payer: Self-pay | Admitting: *Deleted

## 2017-01-12 MED ORDER — IBUPROFEN-FAMOTIDINE 800-26.6 MG PO TABS: 1.0000 | ORAL_TABLET | Freq: Three times a day (TID) | ORAL | 3 refills | Status: DC | PRN

## 2017-01-13 ENCOUNTER — Other Ambulatory Visit (INDEPENDENT_AMBULATORY_CARE_PROVIDER_SITE_OTHER): Payer: BLUE CROSS/BLUE SHIELD

## 2017-01-13 DIAGNOSIS — E538 Deficiency of other specified B group vitamins: Secondary | ICD-10-CM

## 2017-01-13 DIAGNOSIS — R252 Cramp and spasm: Secondary | ICD-10-CM

## 2017-01-13 LAB — COMPREHENSIVE METABOLIC PANEL
ALT: 15 U/L (ref 0–53)
AST: 18 U/L (ref 0–37)
Albumin: 4.6 g/dL (ref 3.5–5.2)
Alkaline Phosphatase: 65 U/L (ref 39–117)
BUN: 19 mg/dL (ref 6–23)
CO2: 31 mEq/L (ref 19–32)
Calcium: 9.7 mg/dL (ref 8.4–10.5)
Chloride: 102 mEq/L (ref 96–112)
Creatinine, Ser: 1.01 mg/dL (ref 0.40–1.50)
GFR: 82.86 mL/min (ref >60.00)
Glucose, Bld: 85 mg/dL (ref 70–99)
Potassium: 4 mEq/L (ref 3.5–5.1)
Sodium: 139 mEq/L (ref 135–145)
Total Bilirubin: 0.5 mg/dL (ref 0.2–1.2)
Total Protein: 7.8 g/dL (ref 6.0–8.3)

## 2017-01-13 LAB — FERRITIN: Ferritin: 175.8 ng/mL (ref 22.0–322.0)

## 2017-01-13 LAB — VITAMIN B12: Vitamin B-12: 325 pg/mL (ref 211–911)

## 2017-01-14 ENCOUNTER — Encounter: Payer: Self-pay | Admitting: Family Medicine

## 2017-01-18 ENCOUNTER — Encounter: Payer: Self-pay | Admitting: Surgical

## 2017-02-12 ENCOUNTER — Other Ambulatory Visit: Payer: Self-pay | Admitting: Family Medicine

## 2017-02-24 ENCOUNTER — Ambulatory Visit: Payer: BLUE CROSS/BLUE SHIELD | Admitting: Family Medicine

## 2017-02-24 ENCOUNTER — Ambulatory Visit: Payer: Self-pay

## 2017-02-24 ENCOUNTER — Encounter: Payer: Self-pay | Admitting: Family Medicine

## 2017-02-24 VITALS — BP 120/74 | HR 62 | Ht 69.0 in | Wt 180.0 lb

## 2017-02-24 DIAGNOSIS — M999 Biomechanical lesion, unspecified: Secondary | ICD-10-CM | POA: Diagnosis not present

## 2017-02-24 DIAGNOSIS — M25531 Pain in right wrist: Secondary | ICD-10-CM

## 2017-02-24 DIAGNOSIS — M65831 Other synovitis and tenosynovitis, right forearm: Secondary | ICD-10-CM | POA: Diagnosis not present

## 2017-02-24 NOTE — Patient Instructions (Signed)
Good to see you  Ice 20 minutes 2 times daily. Usually after activity and before bed. Wrist brace daily for 10ish days Wear at night for another 2 weeks after that  Back is doing well  We will get you in orthotics and will call you  Have a great time on your trip  See me again In 4 weeks

## 2017-02-24 NOTE — Assessment & Plan Note (Signed)
Patient does have an intersection syndrome, given the membranes, we discussed icing regimen, topical anti-inflammatories.  Patient will try the home exercise on a regular basis.  With physical therapy or possible injections.

## 2017-02-24 NOTE — Progress Notes (Signed)
[name] [address], [place] [address] Phone: [phone] Subjective:    CC: Wrist pain  [id number]  George [name] is a 51 y.o. male coming in with complaint of right wrist pain.  Patient has had pain but this is a different area.  Seems to be more on the flexor aspect.  Patient has been doing some more typing.  Seems to be worsening over the course of time.  Not as much pain all over the ECU that he had previously.  But different.  Back pain seems to be stable.  Mild discomfort more over the piriformis again.  Has responded fairly well to osteopathic manipulation.  Not taking the gabapentin on a regular basis at this moment.    Past Medical History:  Diagnosis Date  . Anxiety   . GERD (gastroesophageal reflux disease)   . Seasonal allergies    Past Surgical History:  Procedure Laterality Date  . UPPER GASTROINTESTINAL ENDOSCOPY     Social History   Socioeconomic History  . Marital status: Married    Spouse name: None  . Number of children: 2  . Years of education: None  . Highest education level: None  Social Needs  . Financial resource strain: None  . Food insecurity - worry: None  . Food insecurity - inability: None  . Transportation needs - medical: None  . Transportation needs - non-medical: None  Occupational History  . 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  Tobacco Use  . Smoking status: Never Smoker  . Smokeless tobacco: Never Used  Substance and Sexual Activity  . Alcohol use: Yes    Comment: occasional   . Drug use: No  . Sexual activity: Yes  Other Topics Concern  . None  Social History Narrative   Regular exercise-yes   No Known Allergies Family History  Problem Relation Age of Onset  . Obesity Father   . Asthma Father   . Hypertension Father   . Bladder Cancer Father   . Colon polyps Mother   . Colon cancer Maternal Grandmother   . Diabetes Neg Hx   .  Early death Neg Hx   . Heart disease Neg Hx   . Hyperlipidemia Neg Hx   . Kidney disease Neg Hx   . Stroke Neg Hx   . Esophageal cancer Neg Hx   . Stomach cancer Neg Hx      Past medical history, social, surgical and family history all reviewed in electronic medical record.  No pertanent information unless stated regarding to the chief complaint.   Review of Systems:Review of systems updated and as accurate as of 02/24/17  No headache, visual changes, nausea, vomiting, diarrhea, constipation, dizziness, abdominal pain, skin rash, fevers, chills, night sweats, weight loss, swollen lymph nodes, body aches, joint swelling,  chest pain, shortness of breath, mood changes.  Positive muscle aches  Objective  Blood pressure 120/74, pulse 62, height 5\' 9"  (1.753 m), weight 180 lb (81.6 kg), SpO2 99 %. Systems examined below as of 02/24/17   General: No apparent distress alert and oriented x3 mood and affect normal, dressed appropriately.  HEENT: Pupils equal, extraocular movements intact  Respiratory: Patient's speak in full sentences and does not appear short of breath  Cardiovascular: No lower extremity edema, non tender, no erythema  Skin: Warm dry intact with no signs of infection or rash on extremities or on axial skeleton.  Abdomen: Soft  nontender  Neuro: Cranial nerves II through XII are intact, neurovascularly intact in all extremities with 2+ DTRs and 2+ pulses.  Lymph: No lymphadenopathy of posterior or anterior cervical chain or axillae bilaterally.  Gait normal with good balance and coordination.  MSK:  Non tender with full range of motion and good stability and symmetric strength and tone of shoulders, elbows, hip, knee and ankles bilaterally.  Wrist: Right wrist Inspection normal with no visible erythema or swelling. ROM smooth and normal with good flexion and extension and ulnar/radial deviation that is symmetrical with opposite wrist. Pain over the radial aspect but only flexor  side.  No snuffbox tenderness. No tenderness over Canal of [place]. Strength 5/5 in all directions without pain. Negative Finkelstein, tinel's and phalens. Negative Watson's test.  MSK US performed of: Right wrist This study was ordered, performed, and interpreted by Charlann Boxer D.O.  Wrist: Shows the patient does have significant swelling of the flexor tendons of the wrist just proximal to the wrist crease.  Hypoechoic changes and appears to have more of a intersection syndrome.  IMPRESSION: Intersection syndrome of the wrist  [address]; 15 additional minutes spent for Therapeutic exercises as stated in above notes.  This included exercises focusing on stretching, strengthening, with significant focus on eccentric aspects.   Long term goals include an improvement in range of motion, strength, endurance as well as avoiding reinjury. Patient's frequency would include in 1-2 times a day, 3-5 times a week for a duration of 6-12 weeks.  Proper technique shown and discussed handout in great detail with ATC.  All questions were discussed and answered.    Osteopathic findings  C2 flexed rotated and side bent right C4 flexed rotated and side bent left C6 flexed rotated and side bent left T3 extended rotated and side bent right inhaled third rib T9 extended rotated and side bent left L2 flexed rotated and side bent right Sacrum right on right     Impression and Recommendations:     This case required medical decision making of moderate complexity.      Note: This dictation was prepared with Dragon dictation along with smaller phrase technology. Any transcriptional errors that result from this process are unintentional.

## 2017-02-24 NOTE — Assessment & Plan Note (Signed)
Decision today to treat with OMT was based on Physical Exam  After verbal consent patient was treated with HVLA, ME, FPR techniques in cervical, thoracic, lumbar and sacral areas  Patient tolerated the procedure well with improvement in symptoms  Patient given exercises, stretches and lifestyle modifications  See medications in patient instructions if given  Patient will follow up in 4-8 weeks 

## 2017-03-04 ENCOUNTER — Encounter: Payer: Self-pay | Admitting: Internal Medicine

## 2017-03-04 MED ORDER — DEXLANSOPRAZOLE 60 MG PO CPDR
60.0000 mg | DELAYED_RELEASE_CAPSULE | Freq: Every day | ORAL | 0 refills | Status: DC
Start: 2017-03-04 — End: 2017-03-30

## 2017-03-24 NOTE — Progress Notes (Signed)
[name] [address] [place], [place] [address] Phone: [phone] Subjective:      CC: Back pain follow-up  [id number]  George [name] is a 52 y.o. male coming in for follow up for wrist pain. He did wear his brace for 2 weeks which helped decrease his pain. He notes pain on the lateral and medial sides of his wrist.  Patient was found to have  He also notes that he has pain in his right glute. Denies any radiating pain. He is curious what can be done about the "knot" in his glute.  Has responded fairly well to osteopathic manipulation in the past.  Having some increasing tightness in the lower back.  Not doing the exercises as regularly.  Has during the holidays       Past Medical History:  Diagnosis Date  . Anxiety   . GERD (gastroesophageal reflux disease)   . Seasonal allergies    Past Surgical History:  Procedure Laterality Date  . UPPER GASTROINTESTINAL ENDOSCOPY     Social History   Socioeconomic History  . Marital status: Married    Spouse name: None  . Number of children: 2  . Years of education: None  . Highest education level: None  Social Needs  . Financial resource strain: None  . Food insecurity - worry: None  . Food insecurity - inability: None  . Transportation needs - medical: None  . Transportation needs - non-medical: None  Occupational History  . 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  Tobacco Use  . Smoking status: Never Smoker  . Smokeless tobacco: Never Used  Substance and Sexual Activity  . Alcohol use: Yes    Comment: occasional   . Drug use: No  . Sexual activity: Yes  Other Topics Concern  . None  Social History Narrative   Regular exercise-yes   No Known Allergies Family History  Problem Relation Age of Onset  . Obesity Father   . Asthma Father   . Hypertension Father   . Bladder Cancer Father   . Colon polyps Mother   . Colon cancer Maternal  Grandmother   . Diabetes Neg Hx   . Early death Neg Hx   . Heart disease Neg Hx   . Hyperlipidemia Neg Hx   . Kidney disease Neg Hx   . Stroke Neg Hx   . Esophageal cancer Neg Hx   . Stomach cancer Neg Hx      Past medical history, social, surgical and family history all reviewed in electronic medical record.  No pertanent information unless stated regarding to the chief complaint.   Review of Systems:Review of systems updated and as accurate as of 03/25/17  No headache, visual changes, nausea, vomiting, diarrhea, constipation, dizziness, abdominal pain, skin rash, fevers, chills, night sweats, weight loss, swollen lymph nodes, body aches, joint swelling,  chest pain, shortness of breath, mood changes. +muscle aches  Objective  Blood pressure 110/78, pulse 63, height 5\' 9"  (1.753 m), weight 180 lb (81.6 kg), SpO2 98 %. Systems examined below as of 03/25/17   General: No apparent distress alert and oriented x3 mood and affect normal, dressed appropriately.  HEENT: Pupils equal, extraocular movements intact  Respiratory: Patient's speak in full sentences and does not appear short of breath  Cardiovascular: No lower extremity edema, non tender, no erythema  Skin: Warm dry intact with no signs of infection or rash on extremities  or on axial skeleton.  Abdomen: Soft nontender  Neuro: Cranial nerves II through XII are intact, neurovascularly intact in all extremities with 2+ DTRs and 2+ pulses.  Lymph: No lymphadenopathy of posterior or anterior cervical chain or axillae bilaterally.  Gait normal with good balance and coordination.  MSK:  Non tender with full range of motion and good stability and symmetric strength and tone of shoulders, elbows,  hip, knee and ankles bilaterally.  Right wrist shows the patient does have good range of motion.  Mild tender over the ECU and still mild tender over the first and second compartments of the dorsal aspect of the wrist.  Negative  Finkelstein's.  Back Exam:  Inspection: Mild loss of lordosis Motion: Flexion 40 deg, Extension 20 deg, Side Bending to 35 deg bilaterally,  Rotation to 40 deg bilaterally  SLR laying: Negative  XSLR laying: Negative  Palpable tenderness: None. FABER: Positive on the right side. Sensory change: Gross sensation intact to all lumbar and sacral dermatomes.  Reflexes: 2+ at both patellar tendons, 2+ at achilles tendons, Babinski's downgoing.  Strength at foot  Plantar-flexion: 5/5 Dorsi-flexion: 5/5 Eversion: 5/5 Inversion: 5/5  Leg strength  Quad: 5/5 Hamstring: 5/5 Hip flexor: 5/5 Hip abductors: 5/5  Gait unremarkable.  Osteopathic findings  C2 flexed rotated and side bent right C4 flexed rotated and side bent left C6 flexed rotated and side bent left T3 extended rotated and side bent right inhaled third rib T9 extended rotated and side bent left L2 flexed rotated and side bent right Sacrum right on right     Impression and Recommendations:     This case required medical decision making of moderate complexity.      Note: This dictation was prepared with Dragon dictation along with smaller phrase technology. Any transcriptional errors that result from this process are unintentional.

## 2017-03-25 ENCOUNTER — Ambulatory Visit: Payer: BLUE CROSS/BLUE SHIELD | Admitting: Family Medicine

## 2017-03-25 ENCOUNTER — Encounter: Payer: Self-pay | Admitting: Family Medicine

## 2017-03-25 VITALS — BP 110/78 | HR 63 | Ht 69.0 in | Wt 180.0 lb

## 2017-03-25 DIAGNOSIS — S63094A Other dislocation of right wrist and hand, initial encounter: Secondary | ICD-10-CM

## 2017-03-25 DIAGNOSIS — G5701 Lesion of sciatic nerve, right lower limb: Secondary | ICD-10-CM

## 2017-03-25 DIAGNOSIS — IMO0001 Reserved for inherently not codable concepts without codable children: Secondary | ICD-10-CM

## 2017-03-25 DIAGNOSIS — M999 Biomechanical lesion, unspecified: Secondary | ICD-10-CM

## 2017-03-25 MED ORDER — NITROGLYCERIN 0.2 MG/HR TD PT24: MEDICATED_PATCH | TRANSDERMAL | 1 refills | Status: DC

## 2017-03-25 NOTE — Assessment & Plan Note (Signed)
Stable.  Still some discomfort.  We discussed bracing or taping.  Increasing activity slowly.  Worsening symptoms consider injection and formal physical therapy

## 2017-03-25 NOTE — Assessment & Plan Note (Signed)
Stable.  Continue to monitor.  Likely more of a lumbar radiculopathy.  We discussed icing regimen and home exercises.  We discussed core strengthening again.  Responds well to osteopathic manipulation follow-up again in 2-3 months

## 2017-03-25 NOTE — Assessment & Plan Note (Signed)
Decision today to treat with OMT was based on Physical Exam  After verbal consent patient was treated with HVLA, ME, FPR techniques in cervical, thoracic, lumbar and sacral areas  Patient tolerated the procedure well with improvement in symptoms  Patient given exercises, stretches and lifestyle modifications  See medications in patient instructions if given  Patient will follow up in 8-13 weeks

## 2017-03-25 NOTE — Patient Instructions (Addendum)
Good to see you  Wrap wrist as I showed you before a lot of activity  Brace at night Continue the exercises for the back  Nitroglycerin Protocol   Apply 1/4 nitroglycerin patch to affected area daily.  Change position of patch within the affected area every 24 hours.  You may experience a headache during the first 1-2 weeks of using the patch, these should subside.  If you experience headaches after beginning nitroglycerin patch treatment, you may take your preferred over the counter pain reliever.  Another side effect of the nitroglycerin patch is skin irritation or rash related to patch adhesive.  Please notify our office if you develop more severe headaches or rash, and stop the patch.  Tendon healing with nitroglycerin patch may require 12 to 24 weeks depending on the extent of injury.  Men should not use if taking Viagra, Cialis, or Levitra.   Do not use if you have migraines or rosacea.   See me again in 4-6 weeks

## 2017-03-30 ENCOUNTER — Encounter: Payer: Self-pay | Admitting: Internal Medicine

## 2017-03-30 MED ORDER — DEXLANSOPRAZOLE 60 MG PO CPDR: 60.0000 mg | DELAYED_RELEASE_CAPSULE | Freq: Every day | ORAL | 3 refills | Status: DC

## 2017-03-30 NOTE — Telephone Encounter (Signed)
Patient assistance forms filled to [employer] for [employer].

## 2017-04-06 NOTE — Telephone Encounter (Signed)
Bernita Buffy has approved patient for Assistance Program for Dexilant through 03/14/18. Case #[id number].

## 2017-04-19 ENCOUNTER — Encounter: Payer: Self-pay | Admitting: Family Medicine

## 2017-04-20 ENCOUNTER — Ambulatory Visit: Payer: BLUE CROSS/BLUE SHIELD | Admitting: Family

## 2017-04-20 ENCOUNTER — Encounter: Payer: Self-pay | Admitting: Family

## 2017-04-20 VITALS — BP 110/74 | HR 72 | Temp 98.3°F | Ht 69.0 in | Wt 183.1 lb

## 2017-04-20 DIAGNOSIS — J111 Influenza due to unidentified influenza virus with other respiratory manifestations: Secondary | ICD-10-CM

## 2017-04-20 MED ORDER — OSELTAMIVIR PHOSPHATE 75 MG PO CAPS: 75.0000 mg | ORAL_CAPSULE | Freq: Two times a day (BID) | ORAL | 0 refills | Status: DC

## 2017-04-20 NOTE — Progress Notes (Signed)
George [name] is a 52 y.o. male with the following history as recorded in Epic[employer]:  Patient Active Problem List   Diagnosis Date Noted  . Extensor intersection syndrome of right wrist 02/24/2017  . ECU (extensor carpi ulnaris), subluxation/dislocation, right, initial encounter 07/19/2016  . Lumbar radiculopathy 08/14/2015  . Nonallopathic lesion of thoracic region 05/19/2015  . Nonallopathic lesion of lumbosacral region 05/19/2015  . Nonallopathic lesion of sacral region 05/19/2015  . Piriformis syndrome of right side 04/24/2015  . Allergic rhinitis due to pollen 04/23/2015  . Extrinsic asthma 08/11/2010  . Insomnia 08/11/2010  . 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 GERD 12/08/2007  . Irritable bowel syndrome 12/08/2007    Current Outpatient Medications  Medication Sig Dispense Refill  . cholecalciferol (VITAMIN D) 1000 UNITS tablet Take 1,000 Units by mouth daily. Take 2 pills daily    . dexlansoprazole (DEXILANT) 60 MG capsule Take 1 capsule (60 mg total) by mouth daily. 90 capsule 3  . fluticasone (FLONASE) 50 MCG/ACT nasal spray Place 2 sprays into both nostrils daily. 16 g 11  . gabapentin (NEURONTIN) 100 MG capsule TAKE 2 CAPSULES (200 MG TOTAL) BY MOUTH AT BEDTIME. 180 capsule 0  . Ibuprofen-Famotidine 800-26.6 MG TABS Take 1 tablet by mouth 3 (three) times daily as needed. 90 tablet 3  . Melatonin 5 MG TABS Take by mouth.    Marland Kitchen OVER THE COUNTER MEDICATION Take 1 tablet by mouth 1 day or 1 dose.    . TURMERIC PO Take by mouth 2 (two) times daily.    . nitroGLYCERIN (NITRODUR - DOSED IN MG/24 HR) 0.2 mg/hr patch 1/4 patch daily (Patient not taking: Reported on 04/20/2017) 30 patch 1  . oseltamivir (TAMIFLU) 75 MG capsule Take 1 capsule (75 mg total) by mouth 2 (two) times daily. 10 capsule 0   Current Facility-Administered Medications  Medication Dose Route Frequency Provider Last Rate Last Dose  . 0.9 %  sodium chloride infusion  500 mL Intravenous Continuous Pyrtle, Lajuan Lines, MD        Allergies: Patient has no known allergies.  Past Medical History:  Diagnosis Date  . Anxiety   . GERD (gastroesophageal reflux disease)   . Seasonal allergies     Past Surgical History:  Procedure Laterality Date  . UPPER GASTROINTESTINAL ENDOSCOPY      Family History  Problem Relation Age of Onset  . Obesity Father   . Asthma Father   . Hypertension Father   . Bladder Cancer Father   . Colon polyps Mother   . Colon cancer Maternal Grandmother   . Diabetes Neg Hx   . Early death Neg Hx   . Heart disease Neg Hx   . Hyperlipidemia Neg Hx   . Kidney disease Neg Hx   . Stroke Neg Hx   . Esophageal cancer Neg Hx   . Stomach cancer Neg Hx     Social History   Tobacco Use  . Smoking status: Never Smoker  . Smokeless tobacco: Never Used  Substance Use Topics  . Alcohol use: Yes    Comment: occasional     Subjective:  Started with flu- like symptoms on Monday night; wondering about starting Tamiflu; did get a flu shot this year; notes that son is sick with similar symptoms- son started with symptoms Sunday evening; denies any chest pain or shortness of breath; is currently on Advil for fever/ body aches;   Objective:  Vitals:   04/20/17 1430  BP: 110/74  Pulse: 72  Temp: 98.3 F (36.8 C)  TempSrc: Oral  SpO2: 98%  Weight: 183 lb 1.9 oz (83.1 kg)  Height: 5\' 9"  (1.753 m)    General: Well developed, well nourished, in no acute distress  Skin : Warm and dry.  Head: Normocephalic and atraumatic  Eyes: Sclera and conjunctiva clear; pupils round and reactive to light; extraocular movements intact  Ears: External normal; canals clear; tympanic membranes normal  Oropharynx: Pink, supple. No suspicious lesions  Neck: Supple without thyromegaly, adenopathy  Lungs: Respirations unlabored; clear to auscultation bilaterally without wheeze, rales, rhonchi  CVS exam: normal rate and regular rhythm.  Neurologic: Alert and oriented; speech intact; face symmetrical;  moves all extremities well; CNII-XII intact without focal deficit   Assessment:  1. Flu     Plan:  Rx for Tamiflu 75 mg bid x 5 days; he defers prescription for cough medication; increase fluids, rest and follow-up worse, no better.    No Follow-up on file.  No orders of the defined types were placed in this encounter.   Requested Prescriptions   Signed Prescriptions Disp Refills  . oseltamivir (TAMIFLU) 75 MG capsule 10 capsule 0    Sig: Take 1 capsule (75 mg total) by mouth 2 (two) times daily.

## 2017-04-29 ENCOUNTER — Encounter: Payer: Self-pay | Admitting: Family Medicine

## 2017-04-29 ENCOUNTER — Ambulatory Visit: Payer: BLUE CROSS/BLUE SHIELD | Admitting: Family Medicine

## 2017-04-29 VITALS — BP 118/82 | HR 74 | Temp 98.5°F | Wt 178.6 lb

## 2017-04-29 DIAGNOSIS — J4521 Mild intermittent asthma with (acute) exacerbation: Secondary | ICD-10-CM | POA: Diagnosis not present

## 2017-04-29 MED ORDER — BUDESONIDE-FORMOTEROL FUMARATE 160-4.5 MCG/ACT IN AERO: 2.0000 | INHALATION_SPRAY | Freq: Two times a day (BID) | RESPIRATORY_TRACT | 3 refills | Status: DC

## 2017-04-29 MED ORDER — AZITHROMYCIN 250 MG PO TABS: ORAL_TABLET | ORAL | 0 refills | Status: DC

## 2017-04-29 NOTE — Progress Notes (Signed)
George [name] is a 52 y.o. male here for an acute visit.  History of Present Illness:   George [name], CMA acting as scribe for Dr. Briscoe Deutscher.   HPI:  Patient states that he was seen at another office last week and treated for flu. Finished tameflu and feels some better. He is still having productive cough with yellow sputum. No fever for several days. He is having fatigue and decreased appetite.   PMHx, SurgHx, SocialHx, Medications, and Allergies were reviewed in the Visit Navigator and updated as appropriate.  Current Medications:   Current Outpatient Medications:  .  albuterol (PROVENTIL HFA;VENTOLIN HFA) 108 (90 Base) MCG/ACT inhaler, Inhale 2 puffs into the lungs every 4 (four) hours as needed for wheezing or shortness of breath., Disp: , Rfl:  .  cholecalciferol (VITAMIN D) 1000 UNITS tablet, Take 1,000 Units by mouth daily. Take 2 pills daily, Disp: , Rfl:  .  dexlansoprazole (DEXILANT) 60 MG capsule, Take 1 capsule (60 mg total) by mouth daily., Disp: 90 capsule, Rfl: 3 .  fluticasone (FLONASE) 50 MCG/ACT nasal spray, Place 2 sprays into both nostrils daily., Disp: 16 g, Rfl: 11 .  gabapentin (NEURONTIN) 100 MG capsule, TAKE 2 CAPSULES (200 MG TOTAL) BY MOUTH AT BEDTIME., Disp: 180 capsule, Rfl: 0 .  Ibuprofen-Famotidine 800-26.6 MG TABS, Take 1 tablet by mouth 3 (three) times daily as needed., Disp: 90 tablet, Rfl: 3 .  Melatonin 5 MG TABS, Take by mouth., Disp: , Rfl:  .  nitroGLYCERIN (NITRODUR - DOSED IN MG/24 HR) 0.2 mg/hr patch, 1/4 patch daily, Disp: 30 patch, Rfl: 1 .  OVER THE COUNTER MEDICATION, Take 1 tablet by mouth 1 day or 1 dose., Disp: , Rfl:  .  TURMERIC PO, Take by mouth 2 (two) times daily., Disp: , Rfl:   Current Facility-Administered Medications:  .  0.9 %  sodium chloride infusion, 500 mL, Intravenous, Continuous, Pyrtle, Lajuan Lines, MD   No Known Allergies Review of Systems:   Pertinent items are noted in the HPI. Otherwise, ROS is  negative.  Vitals:   Vitals:   04/29/17 0948  BP: 118/82  Pulse: 74  Temp: 98.5 F (36.9 C)  TempSrc: Oral  SpO2: 95%  Weight: 178 lb 9.6 oz (81 kg)     Body mass index is 26.37 kg/m.  Physical Exam:   Physical Exam  Constitutional: He is oriented to person, place, and time. He appears well-developed and well-nourished. No distress.  HENT:  Head: Normocephalic and atraumatic.  Right Ear: External ear normal.  Left Ear: External ear normal.  Nose: Nose normal.  Mouth/Throat: Oropharynx is clear and moist.  Eyes: Conjunctivae and EOM are normal. Pupils are equal, round, and reactive to light.  Neck: Normal range of motion. Neck supple.  Cardiovascular: Normal rate, regular rhythm, normal heart sounds and intact distal pulses.  Pulmonary/Chest: Effort normal. He has wheezes.  Abdominal: Soft. Bowel sounds are normal.  Musculoskeletal: Normal range of motion.  Neurological: He is alert and oriented to person, place, and time.  Skin: Skin is warm and dry.  Psychiatric: He has a normal mood and affect. His behavior is normal. Judgment and thought content normal.  Nursing note and vitals reviewed.   Assessment and Plan:   1. Mild intermittent reactive airway disease with acute exacerbation - albuterol (PROVENTIL HFA;VENTOLIN HFA) 108 (90 Base) MCG/ACT inhaler; Inhale 2 puffs into the lungs every 4 (four) hours as needed for wheezing or shortness of breath. - budesonide-formoterol (SYMBICORT) 160-4.5  MCG/ACT inhaler; Inhale 2 puffs into the lungs 2 (two) times daily.  Dispense: 1 Inhaler; Refill: 3 - azithromycin (ZITHROMAX) 250 MG tablet; 2 tab on day one then one a day until done.  Dispense: 6 tablet; Refill: 0 (SAFETY NET)   . Reviewed expectations re: course of current medical issues. . Discussed self-management of symptoms. . Outlined signs and symptoms indicating need for more acute intervention. . Patient verbalized understanding and all questions were  answered. Marland Kitchen Health Maintenance issues including appropriate healthy diet, exercise, and smoking avoidance were discussed with patient. . See orders for this visit as documented in the electronic medical record. . Patient received an After Visit Summary.  CMA served as Education administrator during this visit. History, Physical, and Plan performed by medical provider. The above documentation has been reviewed and is accurate and complete. Briscoe Deutscher, D.O.  Briscoe Deutscher, DO [place], [facility] 05/02/2017

## 2017-05-16 ENCOUNTER — Encounter: Payer: Self-pay | Admitting: Family Medicine

## 2017-05-22 ENCOUNTER — Other Ambulatory Visit: Payer: Self-pay | Admitting: Family Medicine

## 2017-07-14 ENCOUNTER — Encounter: Payer: Self-pay | Admitting: Family Medicine

## 2017-07-14 ENCOUNTER — Ambulatory Visit: Payer: BLUE CROSS/BLUE SHIELD | Admitting: Family Medicine

## 2017-07-14 VITALS — BP 90/62 | HR 70 | Ht 69.0 in | Wt 177.0 lb

## 2017-07-14 DIAGNOSIS — M999 Biomechanical lesion, unspecified: Secondary | ICD-10-CM | POA: Diagnosis not present

## 2017-07-14 DIAGNOSIS — G5701 Lesion of sciatic nerve, right lower limb: Secondary | ICD-10-CM

## 2017-07-14 NOTE — Patient Instructions (Signed)
Good to see you  Ice is your friend George [name] removal cream daily for 1 week New ankle stretches to help range of motion  See me again in 4 weeks if needed

## 2017-07-14 NOTE — Assessment & Plan Note (Signed)
Decision today to treat with OMT was based on Physical Exam  After verbal consent patient was treated with HVLA, ME, FPR techniques in cervical, thoracic, lumbar and sacral areas  Patient tolerated the procedure well with improvement in symptoms  Patient given exercises, stretches and lifestyle modifications  See medications in patient instructions if given  Patient will follow up in 4 weeks 

## 2017-07-14 NOTE — Progress Notes (Signed)
[name] [address] [place], [place] [address] Phone: [phone] Subjective:     CC: Neck pain, back pain and foot pain follow-up  [id number]  George [name] is a 52 y.o. male coming in with complaint of neck pain.  Patient has been seen multiple times.  Patient has responded well to manipulation.  Patient does have back pain.  Discussed the pain is a dull, throbbing aching sensation. Patient is having some worsening symptoms overall.  Some more tightness.  Patient says not running at the moment but would like to start running again.  Foot exam shows breakdown of the transverse arch.  Has had a callus formation previously.  Patient feels like it is coming back again.  Wants to have it removed.    Past Medical History:  Diagnosis Date  . Anxiety   . GERD (gastroesophageal reflux disease)   . Seasonal allergies    Past Surgical History:  Procedure Laterality Date  . UPPER GASTROINTESTINAL ENDOSCOPY     Social History   Socioeconomic History  . Marital status: Married    Spouse name: Not on file  . Number of children: 2  . Years of education: Not on file  . Highest education level: Not on file  Occupational History  . 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  Social Needs  . Financial resource strain: Not on file  . Food insecurity:    Worry: Not on file    Inability: Not on file  . Transportation needs:    Medical: Not on file    Non-medical: Not on file  Tobacco Use  . Smoking status: Never Smoker  . Smokeless tobacco: Never Used  Substance and Sexual Activity  . Alcohol use: Yes    Comment: occasional   . Drug use: No  . Sexual activity: Yes  Lifestyle  . Physical activity:    Days per week: Not on file    Minutes per session: Not on file  . Stress: Not on file  Relationships  . Social connections:    Talks on phone: Not on file    Gets together: Not on file    Attends religious  service: Not on file    Active member of club or organization: Not on file    Attends meetings of clubs or organizations: Not on file    Relationship status: Not on file  Other Topics Concern  . Not on file  Social History Narrative   Regular exercise-yes   No Known Allergies Family History  Problem Relation Age of Onset  . Obesity Father   . Asthma Father   . Hypertension Father   . Bladder Cancer Father   . Colon polyps Mother   . Colon cancer Maternal Grandmother   . Diabetes Neg Hx   . Early death Neg Hx   . Heart disease Neg Hx   . Hyperlipidemia Neg Hx   . Kidney disease Neg Hx   . Stroke Neg Hx   . Esophageal cancer Neg Hx   . Stomach cancer Neg Hx      Past medical history, social, surgical and family history all reviewed in electronic medical record.  No pertanent information unless stated regarding to the chief complaint.   Review of Systems:Review of systems updated and as accurate as of 07/14/17  No headache, visual changes, nausea, vomiting, diarrhea, constipation, dizziness, abdominal pain, skin rash, fevers, chills, night sweats,  weight loss, swollen lymph nodes, body aches, joint swelling, chest pain, shortness of breath, mood changes.  Positive muscle aches  Objective  Blood pressure 90/62, pulse 70, height 5\' 9"  (1.753 m), weight 177 lb (80.3 kg), SpO2 97 %. Systems examined below as of 07/14/17   General: No apparent distress alert and oriented x3 mood and affect normal, dressed appropriately.  HEENT: Pupils equal, extraocular movements intact  Respiratory: Patient's speak in full sentences and does not appear short of breath  Cardiovascular: No lower extremity edema, non tender, no erythema  Skin: Warm dry intact with no signs of infection or rash on extremities or on axial skeleton.  Abdomen: Soft nontender  Neuro: Cranial nerves II through XII are intact, neurovascularly intact in all extremities with 2+ DTRs and 2+ pulses.  Lymph: No lymphadenopathy  of posterior or anterior cervical chain or axillae bilaterally.  Gait normal with good balance and coordination.  MSK:  Non tender with full range of motion and good stability and symmetric strength and tone of shoulders, elbows, wrist, hip, knee and ankles bilaterally.  Neck: Inspection mild loss of lordosis. No palpable stepoffs. Negative Spurling's maneuver. Limited range of motion in all planes lacking last 5 to 10 degrees of extension Grip strength and sensation normal in bilateral hands Strength good C4 to T1 distribution No sensory change to C4 to T1 Negative Hoffman sign bilaterally Reflexes normal  Positive tenderness over the trapezius muscle on the right side  Low back shows significant tightness of the hip flexors bilaterally.  Positive Faber test on the right.  Pain over the piriformis still noted.  Negative straight leg test.  Osteopathic findings C4 flexed rotated and side bent left C6 flexed rotated and side bent left T3 extended rotated and side bent right inhaled third rib T9 extended rotated and side bent left L2 flexed rotated and side bent right Sacrum right on right    Impression and Recommendations:     This case required medical decision making of moderate complexity.      Note: This dictation was prepared with Dragon dictation along with smaller phrase technology. Any transcriptional errors that result from this process are unintentional.

## 2017-07-14 NOTE — Assessment & Plan Note (Signed)
More likely exacerbation secondary to the piriformis syndrome.  Discussed icing regimen and home exercise.  Discussed which activities of doing which wants to avoid.  Patient is to increase activity as tolerated.  Does have different medications for breakthrough.  Not taking them at this time.  Patient will continue to increase activity as tolerated.  Follow-up with me again in 4 weeks

## 2017-08-15 ENCOUNTER — Ambulatory Visit: Payer: BLUE CROSS/BLUE SHIELD | Admitting: Family Medicine

## 2017-08-29 NOTE — Progress Notes (Signed)
[name] [place], [place] [address] Phone: [phone] Subjective:     CC: New onset ankle pain, back pain follow-up, wrist pain follow-up  [id number]  George [name] is a 52 y.o. male coming in with complaint of back pain. He has been having posterior hip and groin pain on the right side from martial arts. Pain is intermittent and felt when he does a deep lunge.   Patient also notes that he everted his right ankle 2 weeks ago. Pain on the medial aspect.  Patient states that there may have been swelling initially.  Feels like he sprained his ankle.  Now has tightness that seems to radiate up his calf.  No significant swelling.  No numbness.  He also has right wrist pain on the medial aspect over the TFCC. Pain is intermittent.  Found to have an extensor carpi ulnaris subluxation previously.  Feels very similar but not as sore.  Still gives him some discomfort with certain range of motion     Past Medical History:  Diagnosis Date  . Anxiety   . GERD (gastroesophageal reflux disease)   . Seasonal allergies    Past Surgical History:  Procedure Laterality Date  . UPPER GASTROINTESTINAL ENDOSCOPY     Social History   Socioeconomic History  . Marital status: Married    Spouse name: Not on file  . Number of children: 2  . Years of education: Not on file  . Highest education level: Not on file  Occupational History  . 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  Social Needs  . Financial resource strain: Not on file  . Food insecurity:    Worry: Not on file    Inability: Not on file  . Transportation needs:    Medical: Not on file    Non-medical: Not on file  Tobacco Use  . Smoking status: Never Smoker  . Smokeless tobacco: Never Used  Substance and Sexual Activity  . Alcohol use: Yes    Comment: occasional   . Drug use: No  . Sexual activity: Yes  Lifestyle  . Physical activity:   Days per week: Not on file    Minutes per session: Not on file  . Stress: Not on file  Relationships  . Social connections:    Talks on phone: Not on file    Gets together: Not on file    Attends religious service: Not on file    Active member of club or organization: Not on file    Attends meetings of clubs or organizations: Not on file    Relationship status: Not on file  Other Topics Concern  . Not on file  Social History Narrative   Regular exercise-yes   No Known Allergies Family History  Problem Relation Age of Onset  . Obesity Father   . Asthma Father   . Hypertension Father   . Bladder Cancer Father   . Colon polyps Mother   . Colon cancer Maternal Grandmother   . Diabetes Neg Hx   . Early death Neg Hx   . Heart disease Neg Hx   . Hyperlipidemia Neg Hx   . Kidney disease Neg Hx   . Stroke Neg Hx   . Esophageal cancer Neg Hx   . Stomach cancer Neg Hx      Past medical history, social, surgical and family history all reviewed in electronic medical record.  No  pertanent information unless stated regarding to the chief complaint.   Review of Systems:Review of systems updated and as accurate as of 08/30/17  No headache, visual changes, nausea, vomiting, diarrhea, constipation, dizziness, abdominal pain, skin rash, fevers, chills, night sweats, weight loss, swollen lymph nodes, body aches, joint swelling, chest pain, shortness of breath, mood changes.  Mild positive muscle aches  Objective  Blood pressure 112/82, pulse 72, height 5\' 9"  (1.753 m), weight 179 lb (81.2 kg), SpO2 96 %. Systems examined below as of 08/30/17   General: No apparent distress alert and oriented x3 mood and affect normal, dressed appropriately.  HEENT: Pupils equal, extraocular movements intact  Respiratory: Patient's speak in full sentences and does not appear short of breath  Cardiovascular: No lower extremity edema, non tender, no erythema  Skin: Warm dry intact with no signs of infection  or rash on extremities or on axial skeleton.  Abdomen: Soft nontender  Neuro: Cranial nerves II through XII are intact, neurovascularly intact in all extremities with 2+ DTRs and 2+ pulses.  Lymph: No lymphadenopathy of posterior or anterior cervical chain or axillae bilaterally.  Gait normal with good balance and coordination.  MSK:  Non tender with full range of motion and good stability and symmetric strength and tone of shoulders, elbows,  hip, knee bilaterally.  Ankle: Right No visible erythema or swelling. Range of motion is full in all directions. Strength is 5/5 in all directions. Stable lateral and medial ligaments; squeeze test and kleiger test unremarkable; Talar dome nontender; No pain at base of 5th MT; No tenderness over cuboid; No tenderness over N spot or navicular prominence Mild posterior tibialis tendon tenderness noted No sign of peroneal tendon subluxations or tenderness to palpation Negative tarsal tunnel tinel's Able to walk 4 steps.  Right wrist shows still some discomfort over the ECU.  Patient does not have any no subluxation noted.  Tender in the area though still especially with ulnar deviation of the wrist  Back Exam:  Inspection: Loss of lordosis Motion: Flexion 45 deg, Extension 20 deg, Side Bending to 35 deg bilaterally,  Rotation to 35 deg bilaterally  SLR laying: Negative  XSLR laying: Negative  Palpable tenderness: Tender to palpation the paraspinal musculature FABER: Positive Faber bilaterally. Sensory change: Gross sensation intact to all lumbar and sacral dermatomes.  Reflexes: 2+ at both patellar tendons, 2+ at achilles tendons, Babinski's downgoing.  Strength at foot  Plantar-flexion: 5/5 Dorsi-flexion: 5/5 Eversion: 5/5 Inversion: 5/5  Leg strength  Quad: 5/5 Hamstring: 5/5 Hip flexor: 5/5 Hip abductors: 4/5  Gait unremarkable.    Osteopathic findings C2 flexed rotated and side bent right C4 flexed rotated and side bent left T9  extended rotated and side bent left L3 flexed rotated and side bent right Sacrum right on right    Impression and Recommendations:     This case required medical decision making of moderate complexity.      Note: This dictation was prepared with Dragon dictation along with smaller phrase technology. Any transcriptional errors that result from this process are unintentional.

## 2017-08-30 ENCOUNTER — Ambulatory Visit: Payer: No Typology Code available for payment source | Admitting: Family Medicine

## 2017-08-30 ENCOUNTER — Encounter: Payer: Self-pay | Admitting: Family Medicine

## 2017-08-30 VITALS — BP 112/82 | HR 72 | Ht 69.0 in | Wt 179.0 lb

## 2017-08-30 DIAGNOSIS — M76821 Posterior tibial tendinitis, right leg: Secondary | ICD-10-CM | POA: Diagnosis not present

## 2017-08-30 DIAGNOSIS — S63501A Unspecified sprain of right wrist, initial encounter: Secondary | ICD-10-CM

## 2017-08-30 DIAGNOSIS — M9982 Other biomechanical lesions of thoracic region: Secondary | ICD-10-CM | POA: Diagnosis not present

## 2017-08-30 DIAGNOSIS — M5416 Radiculopathy, lumbar region: Secondary | ICD-10-CM | POA: Diagnosis not present

## 2017-08-30 DIAGNOSIS — M999 Biomechanical lesion, unspecified: Secondary | ICD-10-CM

## 2017-08-30 NOTE — Patient Instructions (Signed)
Good to see you  Wrap the wrist daily for 2 weeks Exercises 3 times a week.  Heel lift in the shoe could help for 1-2 weeks pennsaid pinkie amount topically 2 times daily as needed.  Ice 20 minutes 2 times daily. Usually after activity and before bed. Biking or elliptical could be good or alternate biking and walking See you in August!

## 2017-08-30 NOTE — Assessment & Plan Note (Signed)
Discussed with patient.  Will be more wrapping.  Do not think that an injection is necessary at this time.  Continue bracing at night which patient has.  Follow-up again in 4 to 8 weeks

## 2017-08-30 NOTE — Assessment & Plan Note (Signed)
No longer having radicular symptoms.  Having some radiation going to the groin area.  Will monitor closely.  Patient will be out of the [place] though for the next 6 weeks.  Patient will follow-up afterwards.  Attempted osteopathic manipulation again with fairly good resolution of some discomfort and pain.

## 2017-08-30 NOTE — Assessment & Plan Note (Signed)
Discussed posterior tibial tendinitis.  Discussed icing regimen, discussed the heel lift, discussed home exercise and patient work with Product/process development scientist.  Follow-up again in 4 weeks

## 2017-08-30 NOTE — Assessment & Plan Note (Signed)
Decision today to treat with OMT was based on Physical Exam  After verbal consent patient was treated with HVLA, ME, FPR techniques in cervical, thoracic, lumbar and sacral areas  Patient tolerated the procedure well with improvement in symptoms  Patient given exercises, stretches and lifestyle modifications  See medications in patient instructions if given  Patient will follow up in 6 weeks 

## 2017-09-04 ENCOUNTER — Encounter: Payer: Self-pay | Admitting: Family Medicine

## 2017-09-09 ENCOUNTER — Other Ambulatory Visit: Payer: Self-pay | Admitting: Family Medicine

## 2017-09-12 NOTE — Telephone Encounter (Signed)
Refill done.  

## 2017-10-19 ENCOUNTER — Encounter: Payer: Self-pay | Admitting: Family Medicine

## 2017-10-27 NOTE — Progress Notes (Signed)
[name] [place], [place] [address] Phone: [phone] Subjective:       CC: Back pain follow-up  [id number]  George [name] is a 52 y.o. male coming in with complaint of back pain. States that today his back, wrist and ankle are doing better. Patient has had some tightness in the back again.  Describes a dull, throbbing aching sensation.  Not having any radicular symptoms at the moment.  He does have the piriformis syndrome that has responded to an injection previously.     Past Medical History:  Diagnosis Date  . Anxiety   . GERD (gastroesophageal reflux disease)   . Seasonal allergies    Past Surgical History:  Procedure Laterality Date  . UPPER GASTROINTESTINAL ENDOSCOPY     Social History   Socioeconomic History  . Marital status: Married    Spouse name: Not on file  . Number of children: 2  . Years of education: Not on file  . Highest education level: Not on file  Occupational History  . 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  Social Needs  . Financial resource strain: Not on file  . Food insecurity:    Worry: Not on file    Inability: Not on file  . Transportation needs:    Medical: Not on file    Non-medical: Not on file  Tobacco Use  . Smoking status: Never Smoker  . Smokeless tobacco: Never Used  Substance and Sexual Activity  . Alcohol use: Yes    Comment: occasional   . Drug use: No  . Sexual activity: Yes  Lifestyle  . Physical activity:    Days per week: Not on file    Minutes per session: Not on file  . Stress: Not on file  Relationships  . Social connections:    Talks on phone: Not on file    Gets together: Not on file    Attends religious service: Not on file    Active member of club or organization: Not on file    Attends meetings of clubs or organizations: Not on file    Relationship status: Not on file  Other Topics Concern  . Not on file    Social History Narrative   Regular exercise-yes   No Known Allergies Family History  Problem Relation Age of Onset  . Obesity Father   . Asthma Father   . Hypertension Father   . Bladder Cancer Father   . Colon polyps Mother   . Colon cancer Maternal Grandmother   . Diabetes Neg Hx   . Early death Neg Hx   . Heart disease Neg Hx   . Hyperlipidemia Neg Hx   . Kidney disease Neg Hx   . Stroke Neg Hx   . Esophageal cancer Neg Hx   . Stomach cancer Neg Hx      Past medical history, social, surgical and family history all reviewed in electronic medical record.  No pertanent information unless stated regarding to the chief complaint.   Review of Systems:Review of systems updated and as accurate as of 10/28/17  No headache, visual changes, nausea, vomiting, diarrhea, constipation, dizziness, abdominal pain, skin rash, fevers, chills, night sweats, weight loss, swollen lymph nodes, body aches, joint swelling, muscle aches, chest pain, shortness of breath, mood changes.   Objective  Blood pressure 100/70, pulse 69, height 5\' 9"  (1.753 m), weight 178 lb (80.7 kg), SpO2  98 %. Systems examined below as of 10/28/17   General: No apparent distress alert and oriented x3 mood and affect normal, dressed appropriately.  HEENT: Pupils equal, extraocular movements intact  Respiratory: Patient's speak in full sentences and does not appear short of breath  Cardiovascular: No lower extremity edema, non tender, no erythema  Skin: Warm dry intact with no signs of infection or rash on extremities or on axial skeleton.  Abdomen: Soft nontender  Neuro: Cranial nerves II through XII are intact, neurovascularly intact in all extremities with 2+ DTRs and 2+ pulses.  Lymph: No lymphadenopathy of posterior or anterior cervical chain or axillae bilaterally.  Gait normal with good balance and coordination.  MSK:  Non tender with full range of motion and good stability and symmetric strength and tone of  shoulders, elbows, wrist, hip, knee and ankles bilaterally.  Back Exam:  Inspection: Unremarkable  Motion: Flexion 35 deg, Extension 25 deg, Side Bending to 45 deg bilaterally,  Rotation to 45 deg bilaterally  SLR laying: Negative  XSLR laying: Negative  Palpable tenderness: Tender to palpation the paraspinal musculature in the lumbar spine right greater than left.Marland Kitchen FABER: Positive right. Sensory change: Gross sensation intact to all lumbar and sacral dermatomes.  Reflexes: 2+ at both patellar tendons, 2+ at achilles tendons, Babinski's downgoing.  Strength at foot  Plantar-flexion: 5/5 Dorsi-flexion: 5/5 Eversion: 5/5 Inversion: 5/5  Leg strength  Quad: 5/5 Hamstring: 5/5 Hip flexor: 5/5 Hip abductors: 5/5  Gait unremarkable.  Osteopathic findings  C6 flexed rotated and side bent left T3 extended rotated and side bent right inhaled third rib T8 extended rotated and side bent left L5 flexed rotated and side bent left Sacrum right on right    Impression and Recommendations:     This case required medical decision making of moderate complexity.      Note: This dictation was prepared with Dragon dictation along with smaller phrase technology. Any transcriptional errors that result from this process are unintentional.

## 2017-10-28 ENCOUNTER — Encounter: Payer: Self-pay | Admitting: Family Medicine

## 2017-10-28 ENCOUNTER — Ambulatory Visit: Payer: No Typology Code available for payment source | Admitting: Family Medicine

## 2017-10-28 ENCOUNTER — Other Ambulatory Visit: Payer: Self-pay

## 2017-10-28 VITALS — BP 100/70 | HR 69 | Ht 69.0 in | Wt 178.0 lb

## 2017-10-28 DIAGNOSIS — M999 Biomechanical lesion, unspecified: Secondary | ICD-10-CM | POA: Diagnosis not present

## 2017-10-28 DIAGNOSIS — G5701 Lesion of sciatic nerve, right lower limb: Secondary | ICD-10-CM | POA: Diagnosis not present

## 2017-10-28 DIAGNOSIS — M549 Dorsalgia, unspecified: Secondary | ICD-10-CM | POA: Diagnosis not present

## 2017-10-28 DIAGNOSIS — G8929 Other chronic pain: Secondary | ICD-10-CM

## 2017-10-28 MED ORDER — IBUPROFEN-FAMOTIDINE 800-26.6 MG PO TABS: 1.0000 | ORAL_TABLET | Freq: Three times a day (TID) | ORAL | 3 refills | Status: DC | PRN

## 2017-10-28 NOTE — Patient Instructions (Signed)
Good to see you  PT will call you  Duexis is refilled.  Xray of hip when you have time See me again in 4 weeks

## 2017-10-28 NOTE — Assessment & Plan Note (Signed)
Decision today to treat with OMT was based on Physical Exam  After verbal consent patient was treated with HVLA, ME, FPR techniques in cervical, thoracic, rib,  lumbar and sacral areas  Patient tolerated the procedure well with improvement in symptoms  Patient given exercises, stretches and lifestyle modifications  See medications in patient instructions if given  Patient will follow up in 4-8 weeks 

## 2017-10-28 NOTE — Assessment & Plan Note (Signed)
Doing relatively well.  Responded well to the manipulation.  Discussed icing regimen and home exercises.  Piriformis gets worse we will consider repeating injection.  X-rays pending.  Follow-up again in 4 to 8 weeks

## 2017-11-14 ENCOUNTER — Encounter: Payer: Self-pay | Admitting: Family Medicine

## 2017-11-15 ENCOUNTER — Other Ambulatory Visit: Payer: Self-pay

## 2017-11-15 DIAGNOSIS — G5700 Lesion of sciatic nerve, unspecified lower limb: Secondary | ICD-10-CM

## 2017-11-15 MED ORDER — IBUPROFEN-FAMOTIDINE 800-26.6 MG PO TABS: 1.0000 | ORAL_TABLET | Freq: Three times a day (TID) | ORAL | 3 refills | Status: DC | PRN

## 2017-12-03 ENCOUNTER — Encounter: Payer: Self-pay | Admitting: Family Medicine

## 2017-12-20 NOTE — Progress Notes (Signed)
[name] [place], [place] [address] Phone: [phone] Subjective:    I George [name] am serving as a Education administrator for Dr. Hulan Saas.   CC: Toe pain, back and neck pain follow-up  [id number]  George [name] is a 52 y.o. male coming in with complaint of toe pain. Left great toe. Injured it playing [place]. Toe was initially purple. Feeling better but still painful. No numbness and tingling. Some days worse than others. Hasn't been kicking lately but the toe is still sore during toe off. Wants to know when he can start kicking. PT never called him.   Wants a flu shot. 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   Onset- 2 weeks ago Location- Great toe Duration-  Character- Sore Aggravating factors-  Reliving factors-  Therapies tried-  Severity-1/2   Patient is also following up for the neck and back pain.  Has been seen multiple times.  Mostly poor ergonomics as well as posture.  Trying to do the exercises.  Has been working more and noticing more increased stiffness and tightness of the neck.  No radicular symptoms.  Past Medical History:  Diagnosis Date  . Anxiety   . GERD (gastroesophageal reflux disease)   . Seasonal allergies    Past Surgical History:  Procedure Laterality Date  . UPPER GASTROINTESTINAL ENDOSCOPY     Social History   Socioeconomic History  . Marital status: Married    Spouse name: Not on file  . Number of children: 2  . Years of education: Not on file  . Highest education level: Not on file  Occupational History  . 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  Social Needs  . Financial resource strain: Not on file  . Food insecurity:    Worry: Not on file    Inability: Not on file  . Transportation needs:    Medical: Not on file    Non-medical: Not on file  Tobacco Use  . Smoking status: Never Smoker  . Smokeless tobacco:  Never Used  Substance and Sexual Activity  . Alcohol use: Yes    Comment: occasional   . Drug use: No  . Sexual activity: Yes  Lifestyle  . Physical activity:    Days per week: Not on file    Minutes per session: Not on file  . Stress: Not on file  Relationships  . Social connections:    Talks on phone: Not on file    Gets together: Not on file    Attends religious service: Not on file    Active member of club or organization: Not on file    Attends meetings of clubs or organizations: Not on file    Relationship status: Not on file  Other Topics Concern  . Not on file  Social History Narrative   Regular exercise-yes   No Known Allergies Family History  Problem Relation Age of Onset  . Obesity Father   . Asthma Father   . Hypertension Father   . Bladder Cancer Father   . Colon polyps Mother   . Colon cancer Maternal Grandmother   . Diabetes Neg Hx   . Early death Neg Hx   . Heart disease Neg Hx   . Hyperlipidemia Neg Hx   . Kidney disease Neg Hx   . Stroke Neg Hx   . Esophageal  cancer Neg Hx   . Stomach cancer Neg Hx       Current Outpatient Medications (Cardiovascular):  .  nitroGLYCERIN (NITRODUR - DOSED IN MG/24 HR) 0.2 mg/hr patch, 1/4 patch daily   Current Outpatient Medications (Respiratory):  .  albuterol (PROVENTIL HFA;VENTOLIN HFA) 108 (90 Base) MCG/ACT inhaler, Inhale 2 puffs into the lungs every 4 (four) hours as needed for wheezing or shortness of breath. .  budesonide-formoterol (SYMBICORT) 160-4.5 MCG/ACT inhaler, Inhale 2 puffs into the lungs 2 (two) times daily. .  fluticasone (FLONASE) 50 MCG/ACT nasal spray, Place 2 sprays into both nostrils daily.   Current Outpatient Medications (Analgesics):  Marland Kitchen  Ibuprofen-Famotidine 800-26.6 MG TABS, Take 1 tablet by mouth 3 (three) times daily as needed.     Current Outpatient Medications (Other):  .  azithromycin (ZITHROMAX) 250 MG tablet, 2 tab on day one then one a day until done. .   cholecalciferol (VITAMIN D) 1000 UNITS tablet, Take 1,000 Units by mouth daily. Take 2 pills daily .  dexlansoprazole (DEXILANT) 60 MG capsule, Take 1 capsule (60 mg total) by mouth daily. Marland Kitchen  gabapentin (NEURONTIN) 100 MG capsule, TAKE 2 CAPSULES (200 MG TOTAL) BY MOUTH AT BEDTIME. .  Melatonin 5 MG TABS, Take by mouth. Marland Kitchen  OVER THE COUNTER MEDICATION, Take 1 tablet by mouth 1 day or 1 dose. .  TURMERIC PO, Take by mouth 2 (two) times daily.  Current Facility-Administered Medications (Other):  .  0.9 %  sodium chloride infusion    Past medical history, social, surgical and family history all reviewed in electronic medical record.  No pertanent information unless stated regarding to the chief complaint.   Review of Systems:  No headache, visual changes, nausea, vomiting, diarrhea, constipation, dizziness, abdominal pain, skin rash, fevers, chills, night sweats, weight loss, swollen lymph nodes, body aches, joint swelling, chest pain, shortness of breath, mood changes.  Positive muscle aches  Objective  Blood pressure 104/78, pulse 67, height 5' 9"  (1.753 m), weight 178 lb (80.7 kg), SpO2 95 %.    General: No apparent distress alert and oriented x3 mood and affect normal, dressed appropriately.  HEENT: Pupils equal, extraocular movements intact  Respiratory: Patient's speak in full sentences and does not appear short of breath  Cardiovascular: No lower extremity edema, non tender, no erythema  Skin: Warm dry intact with no signs of infection or rash on extremities or on axial skeleton.  Abdomen: Soft nontender  Neuro: Cranial nerves II through XII are intact, neurovascularly intact in all extremities with 2+ DTRs and 2+ pulses.  Lymph: No lymphadenopathy of posterior or anterior cervical chain or axillae bilaterally.  Gait normal with good balance and coordination.  MSK:  Non tender with full range of motion and good stability and symmetric strength and tone of shoulders, elbows, wrist,  hip, knee and ankles bilaterally.  Neck: Inspection mild loss of lordosis. No palpable stepoffs. Negative Spurling's maneuver. Tightness in all planes Grip strength and sensation normal in bilateral hands Strength good C4 to T1 distribution No sensory change to C4 to T1 Negative Hoffman sign bilaterally Reflexes normal Tightness of the right trapezius  Foot exam shows the patient does have swelling over the first metatarsal of the left foot.  Moderate to severe tenderness to palpation.  No significant deformity though noted.  Mild swelling over the contralateral first metatarsal as well.  Full range of motion of the ankle.  Mild breakdown of the transverse arch bilaterally  Limited musculoskeletal ultrasound was performed and  interpreted by [name]  First metatarsal does have what appears to be a bone spine but does have a fracture of the posterior.  Mild synovitis of the first MTP noted.  Swelling of the IP joint as well. Impression: Multiple fracture of the first metatarsal with some mild reactive synovitis   Osteopathic findings C2 flexed rotated and side bent right T3 extended rotated and side bent right inhaled third rib T7 extended rotated and side bent left L2 flexed rotated and side bent right Sacrum right on right    Impression and Recommendations:     This case required medical decision making of moderate complexity. The above documentation has been reviewed and is accurate and complete [name], DO       Note: This dictation was prepared with Dragon dictation along with smaller phrase technology. Any transcriptional errors that result from this process are unintentional.

## 2017-12-22 ENCOUNTER — Ambulatory Visit (INDEPENDENT_AMBULATORY_CARE_PROVIDER_SITE_OTHER): Payer: No Typology Code available for payment source | Admitting: Family Medicine

## 2017-12-22 ENCOUNTER — Ambulatory Visit: Payer: Self-pay

## 2017-12-22 ENCOUNTER — Encounter: Payer: Self-pay | Admitting: Family Medicine

## 2017-12-22 VITALS — BP 104/78 | HR 67 | Ht 69.0 in | Wt 178.0 lb

## 2017-12-22 DIAGNOSIS — R293 Abnormal posture: Secondary | ICD-10-CM

## 2017-12-22 DIAGNOSIS — M9901 Segmental and somatic dysfunction of cervical region: Secondary | ICD-10-CM | POA: Diagnosis not present

## 2017-12-22 DIAGNOSIS — M79675 Pain in left toe(s): Secondary | ICD-10-CM | POA: Diagnosis not present

## 2017-12-22 DIAGNOSIS — M999 Biomechanical lesion, unspecified: Secondary | ICD-10-CM

## 2017-12-22 DIAGNOSIS — Z23 Encounter for immunization: Secondary | ICD-10-CM | POA: Diagnosis not present

## 2017-12-22 DIAGNOSIS — M9904 Segmental and somatic dysfunction of sacral region: Secondary | ICD-10-CM

## 2017-12-22 DIAGNOSIS — M9908 Segmental and somatic dysfunction of rib cage: Secondary | ICD-10-CM | POA: Diagnosis not present

## 2017-12-22 DIAGNOSIS — M7752 Other enthesopathy of left foot: Secondary | ICD-10-CM

## 2017-12-22 DIAGNOSIS — M9903 Segmental and somatic dysfunction of lumbar region: Secondary | ICD-10-CM

## 2017-12-22 DIAGNOSIS — M9902 Segmental and somatic dysfunction of thoracic region: Secondary | ICD-10-CM

## 2017-12-22 NOTE — Assessment & Plan Note (Signed)
Discussed ergonomics.  Discussed which activities to doing which wants to avoid.  Patient responded very well to manipulation.  Discussed icing regimen and home exercises.  Discussed the importance of scapular strengthening and stability.  Follow-up again in 4 to 8 weeks

## 2017-12-22 NOTE — Patient Instructions (Signed)
Good to see you  We will see if PT can get you this time.  Wear shoes always Elliptical 1 minute hard, 1 minute off.  Try a Physiological scientist.  See me again in 4 weeks

## 2017-12-22 NOTE — Assessment & Plan Note (Signed)
Discussed retractions, vitamin D, topical anti-inflammatories and avoiding activities.  Follow-up again in 4 to 8 weeks

## 2017-12-22 NOTE — Assessment & Plan Note (Signed)
Decision today to treat with OMT was based on Physical Exam  After verbal consent patient was treated with HVLA, ME, FPR techniques in cervical, thoracic, rib,  lumbar and sacral areas  Patient tolerated the procedure well with improvement in symptoms  Patient given exercises, stretches and lifestyle modifications  See medications in patient instructions if given  Patient will follow up in 4-8 weeks 

## 2018-01-26 ENCOUNTER — Ambulatory Visit (INDEPENDENT_AMBULATORY_CARE_PROVIDER_SITE_OTHER): Payer: No Typology Code available for payment source | Admitting: Family Medicine

## 2018-01-26 ENCOUNTER — Encounter: Payer: Self-pay | Admitting: Family Medicine

## 2018-01-26 ENCOUNTER — Ambulatory Visit: Payer: Self-pay

## 2018-01-26 VITALS — BP 102/80 | HR 76 | Ht 69.0 in | Wt 179.0 lb

## 2018-01-26 DIAGNOSIS — M999 Biomechanical lesion, unspecified: Secondary | ICD-10-CM

## 2018-01-26 DIAGNOSIS — M7752 Other enthesopathy of left foot: Secondary | ICD-10-CM

## 2018-01-26 DIAGNOSIS — M79675 Pain in left toe(s): Secondary | ICD-10-CM

## 2018-01-26 DIAGNOSIS — M5416 Radiculopathy, lumbar region: Secondary | ICD-10-CM

## 2018-01-26 NOTE — Progress Notes (Signed)
[name] [place], [place] [address] Phone: [phone] Subjective:   George [name], am serving as a scribe for Dr. Hulan Saas.  I'm seeing this patient by the request  of:    CC: Toe pain follow-up and back pain follow-up  [id number]  George [name] is a 52 y.o. male coming in with complaint of left foot, great toe. He is feeling better. Has been wearing shoes at all times. Does note soreness/achiness when he does not wear shoes. Has been swimming and using stair climber. Would like to get back to martial arts.     Past Medical History:  Diagnosis Date  . Anxiety   . GERD (gastroesophageal reflux disease)   . Seasonal allergies    Past Surgical History:  Procedure Laterality Date  . UPPER GASTROINTESTINAL ENDOSCOPY     Social History   Socioeconomic History  . Marital status: Married    Spouse name: Not on file  . Number of children: 2  . Years of education: Not on file  . Highest education level: Not on file  Occupational History  . 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  Social Needs  . Financial resource strain: Not on file  . Food insecurity:    Worry: Not on file    Inability: Not on file  . Transportation needs:    Medical: Not on file    Non-medical: Not on file  Tobacco Use  . Smoking status: Never Smoker  . Smokeless tobacco: Never Used  Substance and Sexual Activity  . Alcohol use: Yes    Comment: occasional   . Drug use: [name]  . Sexual activity: Yes  Lifestyle  . Physical activity:    Days per week: Not on file    Minutes per session: Not on file  . Stress: Not on file  Relationships  . Social connections:    Talks on phone: Not on file    Gets together: Not on file    Attends religious service: Not on file    Active member of club or organization: Not on file    Attends meetings of clubs or organizations: Not on file    Relationship status: Not on  file  Other Topics Concern  . Not on file  Social History Narrative   Regular exercise-yes   [name] Known Allergies Family History  Problem Relation Age of Onset  . Obesity Father   . Asthma Father   . Hypertension Father   . Bladder Cancer Father   . Colon polyps Mother   . Colon cancer Maternal Grandmother   . Diabetes Neg Hx   . Early death Neg Hx   . Heart disease Neg Hx   . Hyperlipidemia Neg Hx   . Kidney disease Neg Hx   . Stroke Neg Hx   . Esophageal cancer Neg Hx   . Stomach cancer Neg Hx       Current Outpatient Medications (Cardiovascular):  .  nitroGLYCERIN (NITRODUR - DOSED IN MG/24 HR) 0.2 mg/hr patch, 1/4 patch daily   Current Outpatient Medications (Respiratory):  .  albuterol (PROVENTIL HFA;VENTOLIN HFA) 108 (90 Base) MCG/ACT inhaler, Inhale 2 puffs into the lungs every 4 (four) hours as needed for wheezing or shortness of breath. .  budesonide-formoterol (SYMBICORT) 160-4.5 MCG/ACT inhaler, Inhale 2 puffs into the lungs 2 (two) times daily. .  fluticasone (FLONASE) 50 MCG/ACT nasal spray, Place 2  sprays into both nostrils daily.   Current Outpatient Medications (Analgesics):  Marland Kitchen  Ibuprofen-Famotidine 800-26.6 MG TABS, Take 1 tablet by mouth 3 (three) times daily as needed.     Current Outpatient Medications (Other):  .  azithromycin (ZITHROMAX) 250 MG tablet, 2 tab on day one then one a day until done. .  cholecalciferol (VITAMIN D) 1000 UNITS tablet, Take 1,000 Units by mouth daily. Take 2 pills daily .  dexlansoprazole (DEXILANT) 60 MG capsule, Take 1 capsule (60 mg total) by mouth daily. Marland Kitchen  gabapentin (NEURONTIN) 100 MG capsule, TAKE 2 CAPSULES (200 MG TOTAL) BY MOUTH AT BEDTIME. .  Melatonin 5 MG TABS, Take by mouth. Marland Kitchen  OVER THE COUNTER MEDICATION, Take 1 tablet by mouth 1 day or 1 dose. .  TURMERIC PO, Take by mouth 2 (two) times daily.  Current Facility-Administered Medications (Other):  .  0.9 %  sodium chloride infusion    Past medical  history, social, surgical and family history all reviewed in electronic medical record.  [name] pertanent information unless stated regarding to the chief complaint.   Review of Systems:  [name] headache, visual changes, nausea, vomiting, diarrhea, constipation, dizziness, abdominal pain, skin rash, fevers, chills, night sweats, weight loss, swollen lymph nodes, body aches, joint swelling,  chest pain, shortness of breath, mood changes.  Positive muscle aches  Objective  Blood pressure 102/80, pulse 76, height 5\' 9"  (1.753 m), weight 179 lb (81.2 kg), SpO2 98 %.    General: [name] apparent distress alert and oriented x3 mood and affect normal, dressed appropriately.  HEENT: Pupils equal, extraocular movements intact  Respiratory: Patient's speak in full sentences and does not appear short of breath  Cardiovascular: [name] lower extremity edema, non tender, [name] erythema  Skin: Warm dry intact with [name] signs of infection or rash on extremities or on axial skeleton.  Abdomen: Soft nontender  Neuro: Cranial nerves II through XII are intact, neurovascularly intact in all extremities with 2+ DTRs and 2+ pulses.  Lymph: [name] lymphadenopathy of posterior or anterior cervical chain or axillae bilaterally.  Gait normal with good balance and coordination.  MSK:  Non tender with full range of motion and good stability and symmetric strength and tone of shoulders, elbows, wrist, hip, knee and ankles bilaterally.  Foot exam shows the patient does have some hallux rigidus bilaterally.  Patient does have some swelling of the first metatarsal.  Minimal tender to palpation.  Back exam shows that patient does have some loss of lordosis.  Patient does have some tightness with Corky Sox test bilaterally.  Negative straight leg test.  Tenderness over the sacroiliac joint bilaterally.  Near full range of motion of the back  Limited musculoskeletal ultrasound was performed and interpreted by [name]  Limited ultrasound of patient's  first toe still shows a capsulitis noted but [name] longer having any type of bony abnormality that was appreciated previously.  Capsulitis is also improved 50%. Impression: Interval healing  Osteopathic findings C2 flexed rotated and side bent right C6 flexed rotated and side bent left T3 extended rotated and side bent right inhaled third rib T9 extended rotated and side bent left L3 flexed rotated and side bent left  Sacrum right on right     Impression and Recommendations:     This case required medical decision making of moderate complexity. The above documentation has been reviewed and is accurate and complete [name], DO       Note: This dictation was prepared with Viviann Spare  dictation along with smaller phrase technology. Any transcriptional errors that result from this process are unintentional.

## 2018-01-26 NOTE — Patient Instructions (Addendum)
Good to see you  Ice is your friend Get back to kickin butt See me again In 4-6ish weeks.

## 2018-01-26 NOTE — Assessment & Plan Note (Signed)
The back is stable.  No radicular symptoms at the moment.  Responds well to manipulation.  Patient did travel and did have some increase in tightness.

## 2018-01-26 NOTE — Assessment & Plan Note (Signed)
Decision today to treat with OMT was based on Physical Exam  After verbal consent patient was treated with HVLA, ME, FPR techniques in cervical, thoracic, lumbar and sacral areas  Patient tolerated the procedure well with improvement in symptoms  Patient given exercises, stretches and lifestyle modifications  See medications in patient instructions if given  Patient will follow up in 4-6 weeks 

## 2018-01-26 NOTE — Assessment & Plan Note (Signed)
Ultrasound still capsulitis noted.  If continuing to have pain I do want him to do an injection.  Patient will start increasing activity and see how patient tolerated.  Follow-up with me again in 4 to 6 weeks

## 2018-02-07 ENCOUNTER — Encounter: Payer: Self-pay | Admitting: Family Medicine

## 2018-02-13 ENCOUNTER — Telehealth: Payer: Self-pay | Admitting: *Deleted

## 2018-02-13 NOTE — Telephone Encounter (Signed)
Called patient regarding Mychart appt he scheduled for next week. Patient complaints of acid reflux and wheezing since Wednesday/Thursday with exercise. States he has a history of acid reflux with complete cardiac workup. Patient denies chest pain, shortness of breath. Does endorse dry cough. Patient does not believe this is cardiac related. States Symbicort helps. Patient agreed to come in tomorrow to see Inda Coke, PA.

## 2018-02-14 ENCOUNTER — Encounter: Payer: Self-pay | Admitting: Physician Assistant

## 2018-02-14 ENCOUNTER — Ambulatory Visit: Payer: No Typology Code available for payment source | Admitting: Physician Assistant

## 2018-02-14 VITALS — BP 108/82 | HR 86 | Temp 98.6°F | Wt 178.6 lb

## 2018-02-14 DIAGNOSIS — K219 Gastro-esophageal reflux disease without esophagitis: Secondary | ICD-10-CM

## 2018-02-14 DIAGNOSIS — R0789 Other chest pain: Secondary | ICD-10-CM | POA: Diagnosis not present

## 2018-02-14 MED ORDER — SUCRALFATE 1 G PO TABS: 1.0000 g | ORAL_TABLET | Freq: Three times a day (TID) | ORAL | 0 refills | Status: DC

## 2018-02-14 NOTE — Patient Instructions (Signed)
It was great to see you!  Continue flonase, dexilant, symbicort.  Start carafate.  Call GI.  Any cardiac symptoms --> seek medical attention.  Take care,  George Coke PA-C   Food Choices for Gastroesophageal Reflux Disease, Adult When you have gastroesophageal reflux disease (GERD), the foods you eat and your eating habits are very important. Choosing the right foods can help ease your discomfort. What guidelines do I need to follow?  Choose fruits, vegetables, whole grains, and low-fat dairy products.  Choose low-fat meat, fish, and poultry.  Limit fats such as oils, salad dressings, butter, nuts, and avocado.  Keep a food diary. This helps you identify foods that cause symptoms.  Avoid foods that cause symptoms. These may be different for everyone.  Eat small meals often instead of 3 large meals a day.  Eat your meals slowly, in a place where you are relaxed.  Limit fried foods.  Cook foods using methods other than frying.  Avoid drinking alcohol.  Avoid drinking large amounts of liquids with your meals.  Avoid bending over or lying down until 2-3 hours after eating. What foods are not recommended? These are some foods and drinks that may make your symptoms worse: Vegetables Tomatoes. Tomato juice. Tomato and spaghetti sauce. Chili peppers. Onion and garlic. Horseradish. Fruits Oranges, grapefruit, and lemon (fruit and juice). Meats High-fat meats, fish, and poultry. This includes hot dogs, ribs, ham, sausage, salami, and bacon. Dairy Whole milk and chocolate milk. Sour cream. Cream. Butter. Ice cream. Cream cheese. Drinks Coffee and tea. Bubbly (carbonated) drinks or energy drinks. Condiments Hot sauce. Barbecue sauce. Sweets/Desserts Chocolate and cocoa. Donuts. Peppermint and spearmint. Fats and Oils High-fat foods. This includes [place] fries and potato chips. Other Vinegar. Strong spices. This includes black pepper, white pepper, red pepper,  cayenne, curry powder, cloves, ginger, and chili powder. The items listed above may not be a complete list of foods and drinks to avoid. Contact your dietitian for more information. This information is not intended to replace advice given to you by your health care provider. Make sure you discuss any questions you have with your health care provider. Document Released: 08/31/2011 Document Revised: 08/07/2015 Document Reviewed: 01/03/2013 Elsevier Interactive Patient Education  2017 [employer].

## 2018-02-14 NOTE — Progress Notes (Signed)
George [name] is a 52 y.o. male here for a new problem.  History of Present Illness:   Chief Complaint  Patient presents with  . Gastroesophageal Reflux    x 1 week     HPI   Patient presents with a recent increase of heartburn over the past week.  He states that these are his typical reflux symptoms.  This really picked up over Thanksgiving.  He states that his symptoms do not necessarily get worse with activity.  He did resume Symbicort because he does have a history of exercise-induced asthma, and this has helped somewhat.  He continues on Dexilant daily.  He is prescribed this by Dr. Hilarie Fredrickson with GI.  He states a while ago when he first developed the symptoms his primary PCP checked him out for cardiac etiology, states that he underwent a stress test and other cardiac things and everything was normal.  He denies crushing chest pain, shortness of breath, radiation of pain to arm or jaw, fever.   Past Medical History:  Diagnosis Date  . Anxiety   . GERD (gastroesophageal reflux disease)   . Seasonal allergies      Social History   Socioeconomic History  . Marital status: Married    Spouse name: Not on file  . Number of children: 2  . Years of education: Not on file  . Highest education level: Not on file  Occupational History  . 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  Social Needs  . Financial resource strain: Not on file  . Food insecurity:    Worry: Not on file    Inability: Not on file  . Transportation needs:    Medical: Not on file    Non-medical: Not on file  Tobacco Use  . Smoking status: Never Smoker  . Smokeless tobacco: Never Used  Substance and Sexual Activity  . Alcohol use: Yes    Comment: occasional   . Drug use: No  . Sexual activity: Yes  Lifestyle  . Physical activity:    Days per week: Not on file    Minutes per session: Not on file  . Stress: Not on file  Relationships  . Social connections:    Talks  on phone: Not on file    Gets together: Not on file    Attends religious service: Not on file    Active member of club or organization: Not on file    Attends meetings of clubs or organizations: Not on file    Relationship status: Not on file  . Intimate partner violence:    Fear of current or ex partner: Not on file    Emotionally abused: Not on file    Physically abused: Not on file    Forced sexual activity: Not on file  Other Topics Concern  . Not on file  Social History Narrative   Regular exercise-yes    Past Surgical History:  Procedure Laterality Date  . UPPER GASTROINTESTINAL ENDOSCOPY      Family History  Problem Relation Age of Onset  . Obesity Father   . Asthma Father   . Hypertension Father   . Bladder Cancer Father   . Colon polyps Mother   . Colon cancer Maternal Grandmother   . Diabetes Neg Hx   . Early death Neg Hx   . Heart disease Neg Hx   . Hyperlipidemia Neg Hx   . Kidney disease Neg Hx   .  Stroke Neg Hx   . Esophageal cancer Neg Hx   . Stomach cancer Neg Hx     No Known Allergies  Current Medications:   Current Outpatient Medications:  .  budesonide-formoterol (SYMBICORT) 160-4.5 MCG/ACT inhaler, Inhale 2 puffs into the lungs 2 (two) times daily., Disp: 1 Inhaler, Rfl: 3 .  dexlansoprazole (DEXILANT) 60 MG capsule, Take 1 capsule (60 mg total) by mouth daily., Disp: 90 capsule, Rfl: 3 .  fluticasone (FLONASE) 50 MCG/ACT nasal spray, Place 2 sprays into both nostrils daily., Disp: 16 g, Rfl: 11 .  gabapentin (NEURONTIN) 100 MG capsule, TAKE 2 CAPSULES (200 MG TOTAL) BY MOUTH AT BEDTIME., Disp: 180 capsule, Rfl: 1 .  Ibuprofen-Famotidine 800-26.6 MG TABS, Take 1 tablet by mouth 3 (three) times daily as needed., Disp: 90 tablet, Rfl: 3 .  Melatonin 5 MG TABS, Take by mouth., Disp: , Rfl:  .  TURMERIC PO, Take by mouth 2 (two) times daily., Disp: , Rfl:  .  sucralfate (CARAFATE) 1 g tablet, Take 1 tablet (1 g total) by mouth 4 (four) times daily -   with meals and at bedtime., Disp: 56 tablet, Rfl: 0  Current Facility-Administered Medications:  .  0.9 %  sodium chloride infusion, 500 mL, Intravenous, Continuous, Pyrtle, Lajuan Lines, MD   Review of Systems:   ROS  Negative unless otherwise specified per HPI.   Vitals:   Vitals:   02/14/18 1037  BP: 108/82  Pulse: 86  Temp: 98.6 F (37 C)  TempSrc: Oral  SpO2: 98%  Weight: 178 lb 9.6 oz (81 kg)     Body mass index is 26.37 kg/m.  Physical Exam:   Physical Exam  Constitutional: He appears well-developed. He is cooperative.  Non-toxic appearance. He does not have a sickly appearance. He does not appear ill. No distress.  Cardiovascular: Normal rate, regular rhythm, S1 normal, S2 normal, normal heart sounds and normal pulses.  No LE edema  Pulmonary/Chest: Effort normal and breath sounds normal.  Abdominal: Normal appearance and bowel sounds are normal. There is tenderness in the epigastric area. There is no rigidity, no rebound, no guarding and no CVA tenderness.  Neurological: He is alert. GCS eye subscore is 4. GCS verbal subscore is 5. GCS motor subscore is 6.  Skin: Skin is warm, dry and intact.  Psychiatric: He has a normal mood and affect. His speech is normal and behavior is normal.  Nursing note and vitals reviewed.   EKG tracing is personally reviewed.  EKG notes NSR.  No acute changes.    Assessment and Plan:   George [name] was seen today for gastroesophageal reflux.  Diagnoses and all orders for this visit:  Chest discomfort; Gastroesophageal reflux disease, esophagitis presence not specified No red flags on exam.  EKG is reassuring.  Continue Dexilant.  Discussed foods that could possibly be exacerbating his symptoms and avoid triggers.  We are also going to do a 2-week trial of Carafate to see if this helps with his symptoms.  I recommended that he call Dr. Vena Rua office to get an appointment for this.  We also reviewed all warning signs for cardiac issues and  discussed that if any of these present themselves that he needs to go to the ER.  Patient verbalized understanding to plan.  Other orders -     sucralfate (CARAFATE) 1 g tablet; Take 1 tablet (1 g total) by mouth 4 (four) times daily -  with meals and at bedtime.    . Reviewed  expectations re: course of current medical issues. . Discussed self-management of symptoms. . Outlined signs and symptoms indicating need for more acute intervention. . Patient verbalized understanding and all questions were answered. . See orders for this visit as documented in the electronic medical record. . Patient received an After-Visit Summary.  CMA or LPN served as scribe during this visit. History, Physical, and Plan performed by medical provider. The above documentation has been reviewed and is accurate and complete.   Inda Coke, PA-C

## 2018-02-21 ENCOUNTER — Ambulatory Visit: Payer: No Typology Code available for payment source | Admitting: Family Medicine

## 2018-02-22 ENCOUNTER — Encounter: Payer: Self-pay | Admitting: Physician Assistant

## 2018-02-22 ENCOUNTER — Encounter: Payer: Self-pay | Admitting: Family Medicine

## 2018-02-23 ENCOUNTER — Ambulatory Visit: Payer: No Typology Code available for payment source | Admitting: Family Medicine

## 2018-02-23 ENCOUNTER — Ambulatory Visit: Payer: Self-pay

## 2018-02-23 VITALS — BP 102/66 | HR 77 | Ht 69.0 in | Wt 174.0 lb

## 2018-02-23 DIAGNOSIS — M79672 Pain in left foot: Secondary | ICD-10-CM | POA: Diagnosis not present

## 2018-02-23 DIAGNOSIS — M999 Biomechanical lesion, unspecified: Secondary | ICD-10-CM

## 2018-02-23 DIAGNOSIS — M7752 Other enthesopathy of left foot: Secondary | ICD-10-CM | POA: Diagnosis not present

## 2018-02-23 MED ORDER — IBUPROFEN-FAMOTIDINE 800-26.6 MG PO TABS: 1.0000 | ORAL_TABLET | Freq: Three times a day (TID) | ORAL | 3 refills | Status: DC | PRN

## 2018-02-23 NOTE — Progress Notes (Signed)
[name] [address] [place], [place] [address] Phone: [phone] Subjective:     CC: Left foot pain follow-up  [id number]  George [name] is a 52 y.o. male coming in with complaint of left foot pain. Pain over the 4th toe after sparring on Tuesday night. Noticed the next day that his toe was purple. Has tried wrapping toe. Denies any numbness. Noticeable ecchymosis over 4th toe and top of foot.  Patient states that there is no significant amount of pain no.  Nothing likely his last fracture.  Patient wanted to follow-up now before he made worsening problems.  Also here for head and neck as well as back pain.  Has seen patient previously.  Responded well to manipulation field.  Feels like he needs manipulation again.       Past Medical History:  Diagnosis Date  . Anxiety   . GERD (gastroesophageal reflux disease)   . Seasonal allergies    Past Surgical History:  Procedure Laterality Date  . UPPER GASTROINTESTINAL ENDOSCOPY     Social History   Socioeconomic History  . Marital status: Married    Spouse name: Not on file  . Number of children: 2  . Years of education: Not on file  . Highest education level: Not on file  Occupational History  . 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  Social Needs  . Financial resource strain: Not on file  . Food insecurity:    Worry: Not on file    Inability: Not on file  . Transportation needs:    Medical: Not on file    Non-medical: Not on file  Tobacco Use  . Smoking status: Never Smoker  . Smokeless tobacco: Never Used  Substance and Sexual Activity  . Alcohol use: Yes    Comment: occasional   . Drug use: No  . Sexual activity: Yes  Lifestyle  . Physical activity:    Days per week: Not on file    Minutes per session: Not on file  . Stress: Not on file  Relationships  . Social connections:    Talks on phone: Not on file    Gets together: Not on  file    Attends religious service: Not on file    Active member of club or organization: Not on file    Attends meetings of clubs or organizations: Not on file    Relationship status: Not on file  Other Topics Concern  . Not on file  Social History Narrative   Regular exercise-yes   No Known Allergies Family History  Problem Relation Age of Onset  . Obesity Father   . Asthma Father   . Hypertension Father   . Bladder Cancer Father   . Colon polyps Mother   . Colon cancer Maternal Grandmother   . Diabetes Neg Hx   . Early death Neg Hx   . Heart disease Neg Hx   . Hyperlipidemia Neg Hx   . Kidney disease Neg Hx   . Stroke Neg Hx   . Esophageal cancer Neg Hx   . Stomach cancer Neg Hx         Current Outpatient Medications (Respiratory):  .  budesonide-formoterol (SYMBICORT) 160-4.5 MCG/ACT inhaler, Inhale 2 puffs into the lungs 2 (two) times daily. .  fluticasone (FLONASE) 50 MCG/ACT nasal spray, Place 2 sprays into both nostrils daily.   Current Outpatient Medications (Analgesics):  Marland Kitchen  Ibuprofen-Famotidine  800-26.6 MG TABS, Take 1 tablet by mouth 3 (three) times daily as needed. .  Ibuprofen-Famotidine (DUEXIS) 800-26.6 MG TABS, Take 1 tablet by mouth 3 (three) times daily as needed.     Current Outpatient Medications (Other):  .  dexlansoprazole (DEXILANT) 60 MG capsule, Take 1 capsule (60 mg total) by mouth daily. Marland Kitchen  gabapentin (NEURONTIN) 100 MG capsule, TAKE 2 CAPSULES (200 MG TOTAL) BY MOUTH AT BEDTIME. .  Melatonin 5 MG TABS, Take by mouth. .  sucralfate (CARAFATE) 1 g tablet, Take 1 tablet (1 g total) by mouth 4 (four) times daily -  with meals and at bedtime. .  TURMERIC PO, Take by mouth 2 (two) times daily.  Current Facility-Administered Medications (Other):  .  0.9 %  sodium chloride infusion    Past medical history, social, surgical and family history all reviewed in electronic medical record.  No pertanent information unless stated regarding to the  chief complaint.   Review of Systems:  No headache, visual changes, nausea, vomiting, diarrhea, constipation, dizziness, abdominal pain, skin rash, fevers, chills, night sweats, weight loss, swollen lymph nodes, body aches, joint swelling, chest pain, shortness of breath, mood changes.  Positive muscle aches  Objective  Blood pressure 102/66, pulse 77, height 5\' 9"  (1.753 m), weight 174 lb (78.9 kg), SpO2 98 %.    General: No apparent distress alert and oriented x3 mood and affect normal, dressed appropriately.  HEENT: Pupils equal, extraocular movements intact  Respiratory: Patient's speak in full sentences and does not appear short of breath  Cardiovascular: No lower extremity edema, non tender, no erythema  Skin: Warm dry intact with no signs of infection or rash on extremities or on axial skeleton.  Abdomen: Soft nontender  Neuro: Cranial nerves II through XII are intact, neurovascularly intact in all extremities with 2+ DTRs and 2+ pulses.  Lymph: No lymphadenopathy of posterior or anterior cervical chain or axillae bilaterally.  Gait normal with good balance and coordination.  MSK:  Non tender with full range of motion and good stability and symmetric strength and tone of shoulders, elbows, wrist, hip, knee and ankles bilaterally.  Left foot does have some mild ecchymosis at the toes.  Patient has full range of the toes are noted.  Mild tenderness over the fourth distal metatarsal.  Neck exam shows some mild tightness.  Lacks the last 5 degrees of extension.  Tightness on sidebending bilaterally.  Low back has tightness and tenderness around the right sacroiliac joint.  Positive Faber test.  Otherwise unremarkable  Limited musculoskeletal ultrasound was performed interpreted by [name]  Limited ultrasound shows that patient does have a lot of soft tissue swelling but no cortical defect noted of the metatarsals. Impression: Sprain, no fracture  Osteopathic findings C2 flexed  rotated and side bent right C4 flexed rotated and side bent left T3 extended rotated and side bent right inhaled third rib T9 extended rotated and side bent left L2 flexed rotated and side bent right Sacrum right on right     Impression and Recommendations:     This case required medical decision making of moderate complexity. The above documentation has been reviewed and is accurate and complete [name], DO       Note: This dictation was prepared with Dragon dictation along with smaller phrase technology. Any transcriptional errors that result from this process are unintentional.

## 2018-02-23 NOTE — Patient Instructions (Signed)
Good to see you  Refill the duexis  Arnica lotion for the bruising  Stay active in boots  Try sparing boots See me again in 4 weeks Happy holidays!

## 2018-02-24 ENCOUNTER — Encounter: Payer: Self-pay | Admitting: Family Medicine

## 2018-02-24 NOTE — Assessment & Plan Note (Signed)
1.  Foot sprain.  No significant swelling otherwise.  No cortical defect noted.  Patient will deal with her pain.  Follow-up again in 4 weeks

## 2018-02-24 NOTE — Assessment & Plan Note (Signed)
Decision today to treat with OMT was based on Physical Exam  After verbal consent patient was treated with HVLA, ME, FPR techniques in cervical, thoracic, rib lumbar and sacral areas  Patient tolerated the procedure well with improvement in symptoms  Patient given exercises, stretches and lifestyle modifications  See medications in patient instructions if given  Patient will follow up in 4 weeks 

## 2018-02-27 ENCOUNTER — Encounter: Payer: Self-pay | Admitting: Family Medicine

## 2018-03-02 ENCOUNTER — Ambulatory Visit: Payer: No Typology Code available for payment source | Admitting: Family Medicine

## 2018-03-21 ENCOUNTER — Encounter: Payer: Self-pay | Admitting: Family Medicine

## 2018-03-21 ENCOUNTER — Other Ambulatory Visit: Payer: Self-pay | Admitting: Family Medicine

## 2018-03-21 ENCOUNTER — Ambulatory Visit (INDEPENDENT_AMBULATORY_CARE_PROVIDER_SITE_OTHER): Payer: No Typology Code available for payment source | Admitting: Family Medicine

## 2018-03-21 VITALS — BP 108/80 | HR 67 | Temp 97.7°F | Ht 68.5 in | Wt 179.2 lb

## 2018-03-21 DIAGNOSIS — E538 Deficiency of other specified B group vitamins: Secondary | ICD-10-CM | POA: Insufficient documentation

## 2018-03-21 DIAGNOSIS — M5416 Radiculopathy, lumbar region: Secondary | ICD-10-CM

## 2018-03-21 DIAGNOSIS — G4762 Sleep related leg cramps: Secondary | ICD-10-CM

## 2018-03-21 DIAGNOSIS — F419 Anxiety disorder, unspecified: Secondary | ICD-10-CM

## 2018-03-21 DIAGNOSIS — K219 Gastro-esophageal reflux disease without esophagitis: Secondary | ICD-10-CM | POA: Diagnosis not present

## 2018-03-21 DIAGNOSIS — Z9189 Other specified personal risk factors, not elsewhere classified: Secondary | ICD-10-CM

## 2018-03-21 DIAGNOSIS — E7800 Pure hypercholesterolemia, unspecified: Secondary | ICD-10-CM

## 2018-03-21 DIAGNOSIS — E559 Vitamin D deficiency, unspecified: Secondary | ICD-10-CM | POA: Diagnosis not present

## 2018-03-21 DIAGNOSIS — Z1283 Encounter for screening for malignant neoplasm of skin: Secondary | ICD-10-CM

## 2018-03-21 DIAGNOSIS — Z114 Encounter for screening for human immunodeficiency virus [HIV]: Secondary | ICD-10-CM

## 2018-03-21 DIAGNOSIS — Z0000 Encounter for general adult medical examination without abnormal findings: Secondary | ICD-10-CM | POA: Diagnosis not present

## 2018-03-21 DIAGNOSIS — J4521 Mild intermittent asthma with (acute) exacerbation: Secondary | ICD-10-CM

## 2018-03-21 MED ORDER — BUDESONIDE-FORMOTEROL FUMARATE 160-4.5 MCG/ACT IN AERO: 2.0000 | INHALATION_SPRAY | Freq: Two times a day (BID) | RESPIRATORY_TRACT | 3 refills | Status: DC

## 2018-03-21 MED ORDER — SUCRALFATE 1 G PO TABS: 1.0000 g | ORAL_TABLET | Freq: Three times a day (TID) | ORAL | 0 refills | Status: DC

## 2018-03-21 MED ORDER — TRAMADOL HCL 50 MG PO TABS: 50.0000 mg | ORAL_TABLET | Freq: Three times a day (TID) | ORAL | 0 refills | Status: DC | PRN

## 2018-03-21 NOTE — Patient Instructions (Signed)
Magnesium oxide is used typically 400-600 mg/day. It can cause diarrhea in some pts. Magnesium glycinate is better tolerated by GI system but it is hard to find (may have to look up online or health food store).

## 2018-03-21 NOTE — Progress Notes (Signed)
Subjective:    Weylyn Ricciuti is a 53 y.o. male who presents today for his Complete Annual Exam.   Current Outpatient Medications:  .  budesonide-formoterol (SYMBICORT) 160-4.5 MCG/ACT inhaler, Inhale 2 puffs into the lungs 2 (two) times daily., Disp: 1 Inhaler, Rfl: 3 .  dexlansoprazole (DEXILANT) 60 MG capsule, Take 1 capsule (60 mg total) by mouth daily., Disp: 90 capsule, Rfl: 3 .  fluticasone (FLONASE) 50 MCG/ACT nasal spray, Place 2 sprays into both nostrils daily., Disp: 16 g, Rfl: 11 .  gabapentin (NEURONTIN) 100 MG capsule, TAKE 2 CAPSULES (200 MG TOTAL) BY MOUTH AT BEDTIME., Disp: 180 capsule, Rfl: 0 .  Ibuprofen-Famotidine (DUEXIS) 800-26.6 MG TABS, Take 1 tablet by mouth 3 (three) times daily as needed., Disp: 90 tablet, Rfl: 3 .  Ibuprofen-Famotidine 800-26.6 MG TABS, Take 1 tablet by mouth 3 (three) times daily as needed., Disp: 90 tablet, Rfl: 3 .  Melatonin 5 MG TABS, Take by mouth., Disp: , Rfl:  .  TURMERIC PO, Take by mouth 2 (two) times daily., Disp: , Rfl:   Health Maintenance Due  Topic Date Due  . HIV Screening  04/21/1980   PMHx, SurgHx, SocialHx, Medications, and Allergies were reviewed in the Visit Navigator and updated as appropriate.   Past Medical History:  Diagnosis Date  . Anxiety   . GERD (gastroesophageal reflux disease)   . Seasonal allergies      Past Surgical History:  Procedure Laterality Date  . UPPER GASTROINTESTINAL ENDOSCOPY       Family History  Problem Relation Age of Onset  . Obesity Father   . Asthma Father   . Hypertension Father   . Bladder Cancer Father   . Colon polyps Mother   . Colon cancer Maternal Grandmother   . Diabetes Neg Hx   . Early death Neg Hx   . Heart disease Neg Hx   . Hyperlipidemia Neg Hx   . Kidney disease Neg Hx   . Stroke Neg Hx   . Esophageal cancer Neg Hx   . Stomach cancer Neg Hx     Social History   Tobacco Use  . Smoking status: Never Smoker  . Smokeless tobacco: Never Used  Substance  Use Topics  . Alcohol use: Yes    Comment: occasional   . Drug use: No    Review of Systems:   Pertinent items are noted in the HPI. Otherwise, ROS is negative.  Objective:   Vitals:   03/21/18 0858  BP: 108/80  Pulse: 67  Temp: 97.7 F (36.5 C)  SpO2: 98%   Body mass index is 26.85 kg/m.  General Appearance:  Alert, cooperative, no distress, appears stated age  Head:  Normocephalic, without obvious abnormality, atraumatic  Eyes:  PERRL, conjunctiva/corneas clear, EOM's intact, fundi benign, both eyes       Ears:  Normal TM's and external ear canals, both ears  Nose: Nares normal, septum midline, mucosa normal, no drainage    or sinus tenderness  Throat: Lips, mucosa, and tongue normal; teeth and gums normal  Neck: Supple, symmetrical, trachea midline, no adenopathy; thyroid:  No enlargement/tenderness/nodules; no carotit bruit or JVD  Back:   Symmetric, no curvature, ROM normal, no CVA tenderness  Lungs:   Clear to auscultation bilaterally, respirations unlabored  Chest wall:  No tenderness or deformity  Heart:  Regular rate and rhythm, S1 and S2 normal, no murmur, rub   or gallop  Abdomen:   Soft, non-tender, bowel sounds active all four  quadrants, no masses, no organomegaly  Extremities: Extremities normal, atraumatic, no cyanosis or edema  Prostate: Not done.   Skin: Skin color, texture, turgor normal, no rashes or lesions  Lymph nodes: Cervical, supraclavicular, and axillary nodes normal  Neurologic: CNII-XII grossly intact. Normal strength, sensation and reflexes throughout   Assessment/Plan:   Smokey was seen today for annual exam.  Diagnoses and all orders for this visit:  Routine physical examination  Gastroesophageal reflux disease without esophagitis -     sucralfate (CARAFATE) 1 g tablet; Take 1 tablet (1 g total) by mouth 4 (four) times daily -  with meals and at bedtime. -     H. pylori antibody, IgG; Future  B12 deficiency -     CBC with  Differential/Platelet; Future -     Vitamin B12; Future  Vitamin D deficiency -     VITAMIN D 25 Hydroxy (Vit-D Deficiency, Fractures); Future  Pure hypercholesterolemia -     Comprehensive metabolic panel; Future -     Lipid panel; Future  History of weight change -     TSH; Future  Screening for HIV (human immunodeficiency virus) -     HIV Antibody (routine testing w rflx); Future  Mild intermittent reactive airway disease with acute exacerbation -     budesonide-formoterol (SYMBICORT) 160-4.5 MCG/ACT inhaler; Inhale 2 puffs into the lungs 2 (two) times daily.  Screening for skin cancer -     Ambulatory referral to Dermatology  Lumbar radiculopathy -     traMADol (ULTRAM) 50 MG tablet; Take 1 tablet (50 mg total) by mouth every 8 (eight) hours as needed.  Nocturnal leg cramps -     Magnesium; Future  Anxiety -     Ambulatory referral to Psychiatry   Patient Counseling: [x]   Nutrition: Stressed importance of moderation in sodium/caffeine intake, saturated fat and cholesterol, caloric balance, sufficient intake of fresh fruits, vegetables, and fiber.  [x]   Stressed the importance of regular exercise.   []   Substance Abuse: Discussed cessation/primary prevention of tobacco, alcohol, or other drug use; driving or other dangerous activities under the influence; availability of treatment for abuse.   [x]   Injury prevention: Discussed safety belts, safety helmets, smoke detector, smoking near bedding or upholstery.   []   Sexuality: Discussed sexually transmitted diseases, partner selection, use of condoms, avoidance of unintended pregnancy and contraceptive alternatives.   [x]   Dental health: Discussed importance of regular tooth brushing, flossing, and dental visits.  [x]   Health maintenance and immunizations reviewed. Please refer to Health maintenance section.    Briscoe Deutscher, DO [place]

## 2018-03-23 ENCOUNTER — Other Ambulatory Visit (INDEPENDENT_AMBULATORY_CARE_PROVIDER_SITE_OTHER): Payer: No Typology Code available for payment source

## 2018-03-23 DIAGNOSIS — K219 Gastro-esophageal reflux disease without esophagitis: Secondary | ICD-10-CM | POA: Diagnosis not present

## 2018-03-23 DIAGNOSIS — E7800 Pure hypercholesterolemia, unspecified: Secondary | ICD-10-CM

## 2018-03-23 DIAGNOSIS — E538 Deficiency of other specified B group vitamins: Secondary | ICD-10-CM | POA: Diagnosis not present

## 2018-03-23 DIAGNOSIS — Z9189 Other specified personal risk factors, not elsewhere classified: Secondary | ICD-10-CM

## 2018-03-23 DIAGNOSIS — E559 Vitamin D deficiency, unspecified: Secondary | ICD-10-CM | POA: Diagnosis not present

## 2018-03-23 DIAGNOSIS — Z114 Encounter for screening for human immunodeficiency virus [HIV]: Secondary | ICD-10-CM

## 2018-03-23 DIAGNOSIS — G4762 Sleep related leg cramps: Secondary | ICD-10-CM | POA: Diagnosis not present

## 2018-03-23 LAB — VITAMIN B12: Vitamin B-12: 485 pg/mL (ref 211–911)

## 2018-03-23 LAB — H. PYLORI ANTIBODY, IGG: H Pylori IgG: NEGATIVE

## 2018-03-23 LAB — CBC WITH DIFFERENTIAL/PLATELET
Basophils Absolute: 0 10*3/uL (ref 0.0–0.1)
Basophils Relative: 0.4 % (ref 0.0–3.0)
Eosinophils Absolute: 0.2 10*3/uL (ref 0.0–0.7)
Eosinophils Relative: 2.5 % (ref 0.0–5.0)
HCT: 43 % (ref 39.0–52.0)
Hemoglobin: 14.9 g/dL (ref 13.0–17.0)
Lymphocytes Relative: 25.2 % (ref 12.0–46.0)
Lymphs Abs: 1.8 10*3/uL (ref 0.7–4.0)
MCHC: 34.6 g/dL (ref 30.0–36.0)
MCV: 90 fl (ref 78.0–100.0)
Monocytes Absolute: 0.7 10*3/uL (ref 0.1–1.0)
Monocytes Relative: 10.2 % (ref 3.0–12.0)
Neutro Abs: 4.4 10*3/uL (ref 1.4–7.7)
Neutrophils Relative %: 61.7 % (ref 43.0–77.0)
Platelets: 241 10*3/uL (ref 150.0–400.0)
RBC: 4.78 Mil/uL (ref 4.22–5.81)
RDW: 12.9 % (ref 11.5–15.5)
WBC: 7.2 10*3/uL (ref 4.0–10.5)

## 2018-03-23 LAB — COMPREHENSIVE METABOLIC PANEL
ALT: 21 U/L (ref 0–53)
AST: 23 U/L (ref 0–37)
Albumin: 4.3 g/dL (ref 3.5–5.2)
Alkaline Phosphatase: 70 U/L (ref 39–117)
BUN: 20 mg/dL (ref 6–23)
CO2: 31 mEq/L (ref 19–32)
Calcium: 9.6 mg/dL (ref 8.4–10.5)
Chloride: 102 mEq/L (ref 96–112)
Creatinine, Ser: 1.21 mg/dL (ref 0.40–1.50)
GFR: 66.95 mL/min (ref >60.00)
Glucose, Bld: 87 mg/dL (ref 70–99)
Potassium: 4.1 mEq/L (ref 3.5–5.1)
Sodium: 140 mEq/L (ref 135–145)
Total Bilirubin: 0.7 mg/dL (ref 0.2–1.2)
Total Protein: 7 g/dL (ref 6.0–8.3)

## 2018-03-23 LAB — VITAMIN D 25 HYDROXY (VIT D DEFICIENCY, FRACTURES): VITD: 31.89 ng/mL (ref 30.00–100.00)

## 2018-03-23 LAB — MAGNESIUM: Magnesium: 2 mg/dL (ref 1.5–2.5)

## 2018-03-23 LAB — LIPID PANEL
Cholesterol: 215 mg/dL — ABNORMAL HIGH (ref 0–200)
HDL: 47.4 mg/dL (ref >39.00)
LDL Cholesterol: 144 mg/dL — ABNORMAL HIGH (ref 0–99)
NonHDL: 167.35
Total CHOL/HDL Ratio: 5
Triglycerides: 119 mg/dL (ref 0.0–149.0)
VLDL: 23.8 mg/dL (ref 0.0–40.0)

## 2018-03-23 LAB — TSH: TSH: 3.55 u[IU]/mL (ref 0.35–4.50)

## 2018-03-24 LAB — HIV ANTIBODY (ROUTINE TESTING W REFLEX): HIV 1&2 Ab, 4th Generation: NONREACTIVE

## 2018-03-25 NOTE — Progress Notes (Signed)
[name] [address], [place] [address] Phone: [phone] Subjective:    I George [name] am serving as a Education administrator for Dr. Hulan Saas.   CC: Left foot pain  OBS:[id number]  George [name] is a 53 y.o. male coming in with complaint of left foot pain. Foot is feeling ok. States that he is having a heel issue today. Want his achilles checked. Downward dog and inclined pushups are painful. Wants to talk about neck mobility.  Patient has been trying to be active otherwise.  Patient has been seen previously for manipulation of the neck and back.      Past Medical History:  Diagnosis Date  . Anxiety   . GERD (gastroesophageal reflux disease)   . Seasonal allergies    Past Surgical History:  Procedure Laterality Date  . UPPER GASTROINTESTINAL ENDOSCOPY     Social History   Socioeconomic History  . Marital status: Married    Spouse name: Not on file  . Number of children: 2  . Years of education: Not on file  . Highest education level: Not on file  Occupational History  . 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  Social Needs  . Financial resource strain: Not on file  . Food insecurity:    Worry: Not on file    Inability: Not on file  . Transportation needs:    Medical: Not on file    Non-medical: Not on file  Tobacco Use  . Smoking status: Never Smoker  . Smokeless tobacco: Never Used  Substance and Sexual Activity  . Alcohol use: Yes    Comment: occasional   . Drug use: No  . Sexual activity: Yes  Lifestyle  . Physical activity:    Days per week: Not on file    Minutes per session: Not on file  . Stress: Not on file  Relationships  . Social connections:    Talks on phone: Not on file    Gets together: Not on file    Attends religious service: Not on file    Active member of club or organization: Not on file    Attends meetings of clubs or organizations: Not on file   Relationship status: Not on file  Other Topics Concern  . Not on file  Social History Narrative   Regular exercise-yes   No Known Allergies Family History  Problem Relation Age of Onset  . Obesity Father   . Asthma Father   . Hypertension Father   . Bladder Cancer Father   . Colon polyps Mother   . Colon cancer Maternal Grandmother   . Diabetes Neg Hx   . Early death Neg Hx   . Heart disease Neg Hx   . Hyperlipidemia Neg Hx   . Kidney disease Neg Hx   . Stroke Neg Hx   . Esophageal cancer Neg Hx   . Stomach cancer Neg Hx         Current Outpatient Medications (Respiratory):  .  budesonide-formoterol (SYMBICORT) 160-4.5 MCG/ACT inhaler, Inhale 2 puffs into the lungs 2 (two) times daily. .  fluticasone (FLONASE) 50 MCG/ACT nasal spray, Place 2 sprays into both nostrils daily.   Current Outpatient Medications (Analgesics):  Marland Kitchen  Ibuprofen-Famotidine (DUEXIS) 800-26.6 MG TABS, Take 1 tablet by mouth 3 (three) times daily as needed. .  traMADol (ULTRAM) 50 MG tablet, Take 1 tablet (50 mg total) by mouth every 8 (eight)  hours as needed.     Current Outpatient Medications (Other):  .  dexlansoprazole (DEXILANT) 60 MG capsule, Take 1 capsule (60 mg total) by mouth daily. Marland Kitchen  gabapentin (NEURONTIN) 100 MG capsule, TAKE 2 CAPSULES (200 MG TOTAL) BY MOUTH AT BEDTIME. .  Melatonin 5 MG TABS, Take by mouth. .  sucralfate (CARAFATE) 1 g tablet, Take 1 tablet (1 g total) by mouth 4 (four) times daily -  with meals and at bedtime. .  TURMERIC PO, Take by mouth 2 (two) times daily.  Current Facility-Administered Medications (Other):  .  0.9 %  sodium chloride infusion    Past medical history, social, surgical and family history all reviewed in electronic medical record.  No pertanent information unless stated regarding to the chief complaint.   Review of Systems:  No headache, visual changes, nausea, vomiting, diarrhea, constipation, dizziness, abdominal pain, skin rash, fevers,  chills, night sweats, weight loss, swollen lymph nodes, body aches, joint swelling,  chest pain, shortness of breath, mood changes.  Positive muscle aches  Objective  Blood pressure 102/78, pulse 72, height 5' 8.5" (1.74 m), weight 179 lb (81.2 kg), SpO2 98 %.    General: No apparent distress alert and oriented x3 mood and affect normal, dressed appropriately.  HEENT: Pupils equal, extraocular movements intact  Respiratory: Patient's speak in full sentences and does not appear short of breath  Cardiovascular: No lower extremity edema, non tender, no erythema  Skin: Warm dry intact with no signs of infection or rash on extremities or on axial skeleton.  Abdomen: Soft nontender  Neuro: Cranial nerves II through XII are intact, neurovascularly intact in all extremities with 2+ DTRs and 2+ pulses.  Lymph: No lymphadenopathy of posterior or anterior cervical chain or axillae bilaterally.  Gait normal with good balance and coordination.  MSK:  Non tender with full range of motion and good stability and symmetric strength and tone of shoulders, elbows, wrist, hip, knee bilaterally.  Left ankle exam shows that patient does have some mild limited range of motion in dorsiflexion.  Patient's Achilles is nontender but appears to have more of a callus or bursal changes on the lateral aspect.  Bogginess found.  Tender to palpation.   Neck: Inspection mild loss of lordosis. No palpable stepoffs. Negative Spurling's maneuver. Limited range of motion no pain from 5 to 10 degrees Grip strength and sensation normal in bilateral hands Strength good C4 to T1 distribution No sensory change to C4 to T1 Negative Hoffman sign bilaterally Reflexes normal Tightness in the right trapezius is noted.  Multiple trigger points  Osteopathic findings  C7 flexed rotated and side bent left T3 extended rotated and side bent right inhaled third rib T5 extended rotated and side bent left L2 flexed rotated and side bent  right Sacrum right on right    Impression and Recommendations:     This case required medical decision making of moderate complexity. The above documentation has been reviewed and is accurate and complete [name], DO       Note: This dictation was prepared with Dragon dictation along with smaller phrase technology. Any transcriptional errors that result from this process are unintentional.

## 2018-03-27 ENCOUNTER — Ambulatory Visit: Payer: No Typology Code available for payment source | Admitting: Family Medicine

## 2018-03-27 ENCOUNTER — Encounter: Payer: Self-pay | Admitting: Family Medicine

## 2018-03-27 VITALS — BP 102/78 | HR 72 | Ht 68.5 in | Wt 179.0 lb

## 2018-03-27 DIAGNOSIS — M7660 Achilles tendinitis, unspecified leg: Secondary | ICD-10-CM | POA: Diagnosis not present

## 2018-03-27 DIAGNOSIS — M999 Biomechanical lesion, unspecified: Secondary | ICD-10-CM

## 2018-03-27 DIAGNOSIS — M25572 Pain in left ankle and joints of left foot: Secondary | ICD-10-CM | POA: Diagnosis not present

## 2018-03-27 DIAGNOSIS — R293 Abnormal posture: Secondary | ICD-10-CM | POA: Diagnosis not present

## 2018-03-27 NOTE — Assessment & Plan Note (Signed)
Patient is more of a callus formation noted.  Noticeably more anxious but not having pain other issues.  Only direct palpation in the area.  For range of motion of the ankle otherwise.  Achilles does not seem to be as tender.

## 2018-03-27 NOTE — Assessment & Plan Note (Signed)
Continued poor posture.  Discussed icing regimen and home exercises.  Discussed ergonomics throughout the day.  Responded well from manipulation.  Patient will increase activity slowly.  Follow-up again in 4 weeks

## 2018-03-27 NOTE — Patient Instructions (Addendum)
Good to see you  Ice is your friend Change your orthotic  Need a big band-aid daily on the heel.  Tape the toe.  Stay active  New exercises for the neck  See me again in 4 weeks

## 2018-03-27 NOTE — Assessment & Plan Note (Signed)
Decision today to treat with OMT was based on Physical Exam  After verbal consent patient was treated with HVLA, ME, FPR techniques in cervical, thoracic, rib, lumbar and sacral areas  Patient tolerated the procedure well with improvement in symptoms  Patient given exercises, stretches and lifestyle modifications  See medications in patient instructions if given  Patient will follow up in 4 weeks 

## 2018-03-28 ENCOUNTER — Ambulatory Visit: Payer: No Typology Code available for payment source | Admitting: Family Medicine

## 2018-03-28 ENCOUNTER — Ambulatory Visit: Payer: No Typology Code available for payment source | Admitting: Internal Medicine

## 2018-03-28 ENCOUNTER — Encounter: Payer: Self-pay | Admitting: Internal Medicine

## 2018-03-28 VITALS — BP 110/64 | HR 68 | Ht 68.0 in | Wt 177.0 lb

## 2018-03-28 DIAGNOSIS — R0789 Other chest pain: Secondary | ICD-10-CM

## 2018-03-28 DIAGNOSIS — K219 Gastro-esophageal reflux disease without esophagitis: Secondary | ICD-10-CM | POA: Diagnosis not present

## 2018-03-28 DIAGNOSIS — M940 Chondrocostal junction syndrome [Tietze]: Secondary | ICD-10-CM

## 2018-03-28 MED ORDER — METHYLPREDNISOLONE 4 MG PO TBPK: ORAL_TABLET | ORAL | 0 refills | Status: DC

## 2018-03-28 NOTE — Patient Instructions (Signed)
We have sent the following medications to your pharmacy for you to pick up at your convenience: Medrol Dose pack  Continue Dexilant.   Call our office in 10 days to give [place] an update on your chest wall pain.  If you are age 52 or older, your body mass index should be between 23-30. Your Body mass index is 26.91 kg/m. If this is out of the aforementioned range listed, please consider follow up with your Primary Care Provider.  If you are age 34 or younger, your body mass index should be between 19-25. Your Body mass index is 26.91 kg/m. If this is out of the aformentioned range listed, please consider follow up with your Primary Care Provider.

## 2018-03-29 ENCOUNTER — Encounter: Payer: Self-pay | Admitting: Internal Medicine

## 2018-03-29 NOTE — Progress Notes (Signed)
Subjective:    Patient ID: George [name], male    DOB: Mar 14, 1966, 53 y.o.   MRN: [id number]  HPI Candy Ziegler is a 53 year-old male with PMH of GERD, family hx of colon cancer (father), history of sessile serrated colon polyp who is here for follow-up.  He is here alone today and was last seen in the office in May 2018 and for procedures, upper endoscopy and colonoscopy on 11/29/2016.  EGD was normal.  Biopsies from the stomach negative for H. Pylori. Colonoscopy 2 sessile polyps ranging in size from 3 to 5 mm removed from the ascending colon.  Found to be sessile serrated polyps without dysplasia.  Internal hemorrhoids on retroflexion.  He has been maintained on Dexilant 60 mg daily.  He has been having tightness in his lower chest esophagus along with some dry cough and wheezing.  No trouble swallowing.  Seems to be more constant and low level rather than episodic.  No definitive dietary triggers.  He is tried to avoid spicy foods such as red sauce.  He is been consistent with Dexilant 60 mg and despite this he has had some heartburn at times.  No water or sour brash.  Not waking him up in the middle of the night.  Seem to worsen after Thanksgiving.  Primary care added Carafate tablets 1 g which he is found difficult to take 3 times daily before meals and at bedtime but has been using it about twice daily.  Not sure is helped much.  No abdominal pain.  No change in bowel habit.  No blood in his stool or melena.  Review of Systems As per HPI, otherwise negative  Current Medications, Allergies, Past Medical History, Past Surgical History, Family History and Social History were reviewed in [employer] record.     Objective:   Physical Exam BP 110/64   Pulse 68   Ht 5\' 8"  (1.727 m)   Wt 177 lb (80.3 kg)   BMI 26.91 kg/m  Constitutional: Well-developed and well-nourished. No distress. HEENT: Normocephalic and atraumatic.  Conjunctivae are normal.  No scleral  icterus. Neck: Neck supple. Trachea midline. Cardiovascular: Normal rate, regular rhythm and intact distal pulses. No M/R/G, there is tenderness along the left lower sternum where the ribs joint anteriorly and a very point area, no other tenderness anterior chest Pulmonary/chest: Effort normal and breath sounds normal. No wheezing, rales or rhonchi. Abdominal: Soft, nontender, nondistended. Bowel sounds active throughout. There are no masses palpable. No hepatosplenomegaly. Extremities: no clubbing, cyanosis, or edema Neurological: Alert and oriented to person place and time. Skin: Skin is warm and dry. Psychiatric: Normal mood and affect. Behavior is normal.     Assessment & Plan:  53 year-old male with PMH of GERD, family hx of colon cancer (father), history of sessile serrated colon polyp who is here for follow-up.  1.  Chest wall pain/GERD --he does have a history of gastroesophageal reflux disease though the pain that he is describing I am not convinced is reflux related.  He is certainly tender to palpation of the anterior chest and for this reason I am going to first treat him for costochondritis.  We also discussed adding Pepcid in the evening for a month to see if this improves his overall symptoms.  Another option would be adding amitriptyline for functional heartburn or esophageal hypersensitivity.  If none of these were helpful then consider 24-hour pH and impedance testing to objectively answer the question about uncontrolled reflux --Continue  Dexilant 60 mg daily for now --Discontinue Carafate, if used in the future would consider the liquid version given where his pain is located --Medrol Dosepak for costochondritis, the patient asked to notify me in 7 to 10 days to see if this helped his pain --If no benefit with steroids, add Pepcid 20 mg in the evening x30 days --If no benefit try amitriptyline --If no benefit consider 24-hour pH and impedance  2.  CRC screening/history of  sessile serrated polyp/family history of colon cancer --surveillance colonoscopy on the 5-year interval based on family history and personal history.  Repeat September 2023  25 minutes spent with the patient today. Greater than 50% was spent in counseling and coordination of care with the patient

## 2018-04-12 NOTE — Telephone Encounter (Signed)
Please let Dr. Lenna Gilford know that I am glad the steroids helped his chest pain.  This indicates that this was not GERD but rather chest wall pain, costochondritis. For the cough and wheezing I would recommend that he see Dr. [name] to further discuss; she can determine if he needs to see pulmonology  If we have [place] please allow him to come to receive samples until he receives his prescription  Thanks JMP

## 2018-04-28 ENCOUNTER — Telehealth: Payer: Self-pay | Admitting: Internal Medicine

## 2018-04-28 NOTE — Telephone Encounter (Signed)
Pt is requesting that we call Bernita Buffy to provide the office phone number. Pt states that we submitted a form to Bernita Buffy so he could get Dexilant at a discounted price. The form that was submitted did not have our phone number. Therefore, [employer] cannot process the form. Pt was at the phone with Bernita Buffy for 45 minutes trying to provide them with our phone number but they refused to take it from him insisting that we needed to contact them to provide that information. [employer]'s phone # is [phone].

## 2018-05-01 NOTE — Telephone Encounter (Signed)
I have spoken to [place] at [place] who states they were missing providers' phone and fax number (which were on the forms faxed from our office to [facility]). These were again provided to them. Thayer Headings states this information will be sent to her supervisor so patient's assistance forms can be processed.  I have left a message advising patient of this.

## 2018-05-01 NOTE — Telephone Encounter (Signed)
Left message advising pt I was returning his call.

## 2018-05-01 NOTE — Telephone Encounter (Signed)
Pt advised that he spoke to you this morning would like to speak to you.Marland Kitchen

## 2018-05-08 ENCOUNTER — Ambulatory Visit: Payer: No Typology Code available for payment source | Admitting: Family Medicine

## 2018-05-10 ENCOUNTER — Other Ambulatory Visit: Payer: Self-pay

## 2018-05-10 ENCOUNTER — Encounter: Payer: Self-pay | Admitting: Family Medicine

## 2018-05-10 MED ORDER — OSELTAMIVIR PHOSPHATE 75 MG PO CAPS: 75.0000 mg | ORAL_CAPSULE | Freq: Two times a day (BID) | ORAL | 0 refills | Status: DC

## 2018-06-08 ENCOUNTER — Encounter: Payer: Self-pay | Admitting: Family Medicine

## 2018-06-11 ENCOUNTER — Other Ambulatory Visit: Payer: Self-pay

## 2018-06-11 DIAGNOSIS — J4521 Mild intermittent asthma with (acute) exacerbation: Secondary | ICD-10-CM

## 2018-06-11 MED ORDER — BUDESONIDE-FORMOTEROL FUMARATE 160-4.5 MCG/ACT IN AERO: 2.0000 | INHALATION_SPRAY | Freq: Two times a day (BID) | RESPIRATORY_TRACT | 3 refills | Status: DC

## 2018-06-12 ENCOUNTER — Other Ambulatory Visit: Payer: Self-pay | Admitting: Family Medicine

## 2018-06-15 ENCOUNTER — Ambulatory Visit: Payer: No Typology Code available for payment source | Admitting: Family Medicine

## 2018-07-12 NOTE — Telephone Encounter (Signed)
We have received a fax from [facility] [facility] indicating that patient has been approved for [place] patient assistance until 4/29/202. Case #[id number].

## 2018-08-25 ENCOUNTER — Encounter: Payer: Self-pay | Admitting: Family Medicine

## 2018-09-21 NOTE — Progress Notes (Signed)
Virtual Visit via Video   Due to the COVID-19 pandemic, this visit was completed with telemedicine (audio/video) technology to reduce patient and provider exposure as well as to preserve personal protective equipment.   I connected with George [name] by a video enabled telemedicine application and verified that I am speaking with the correct person using two identifiers. Location patient: Home Location provider: [facility], Office Persons participating in the virtual visit: George [name], Haq, DO George [name], CMA acting as scribe for Dr. Briscoe [name].   I discussed the limitations of evaluation and management by telemedicine and the availability of in person appointments. The patient expressed understanding and agreed to proceed.  Care Team   Patient Care Team: George Deutscher, DO as PCP - General (Family Medicine)  Subjective:   HPI:   Gastroesophageal Reflux He complains of heartburn and wheezing. He reports no abdominal pain, no chest pain, no coughing, no nausea or no sore throat. This is a chronic problem. The symptoms are aggravated by certain foods and ETOH. Pertinent negatives include no anemia, melena or weight loss. Risk factors include NSAIDs. He has tried a PPI and a histamine-2 antagonist for the symptoms.  Asthma He complains of shortness of breath and wheezing. There is no cough. The current episode started 1 to 4 weeks ago. The problem has been waxing and waning. Associated symptoms include dyspnea on exertion, heartburn and postnasal drip. Pertinent negatives include no chest pain, fever, headaches, malaise/fatigue, myalgias, nasal congestion, rhinorrhea, sneezing, sore throat, sweats, trouble swallowing or weight loss. His symptoms are aggravated by change in weather, pollen and URI. His symptoms are alleviated by steroid inhaler and beta-agonist. He reports moderate improvement on treatment. Risk factors for lung disease include travel. His past medical  history is significant for asthma.   Review of Systems  Constitutional: Negative for chills, fever, malaise/fatigue and weight loss.  HENT: Positive for postnasal drip. Negative for rhinorrhea, sneezing, sore throat and trouble swallowing.   Respiratory: Positive for shortness of breath and wheezing. Negative for cough.   Cardiovascular: Positive for dyspnea on exertion. Negative for chest pain, palpitations and leg swelling.  Gastrointestinal: Positive for heartburn. Negative for abdominal pain, constipation, diarrhea, melena, nausea and vomiting.  Genitourinary: Negative for dysuria and urgency.  Musculoskeletal: Negative for joint pain and myalgias.  Skin: Negative for rash.  Neurological: Negative for dizziness and headaches.  Psychiatric/Behavioral: Negative for depression, substance abuse and suicidal ideas. The patient is not nervous/anxious.     Patient Active Problem List   Diagnosis Date Noted  . Vitamin D deficiency 03/21/2018  . B12 deficiency 03/21/2018  . Poor posture 12/22/2017  . Capsulitis of metatarsophalangeal (MTP) joint of left foot 12/22/2017  . Nonallopathic lesion of cervical region 10/28/2017  . Nonallopathic lesion of rib cage 10/28/2017  . Tibialis posterior tendinitis, right 08/30/2017  . Extensor intersection syndrome of right wrist 02/24/2017  . ECU (extensor carpi ulnaris), subluxation/dislocation, right, initial encounter 07/19/2016  . Lumbar radiculopathy 08/14/2015  . Nonallopathic lesion of thoracic region 05/19/2015  . Nonallopathic lesion of lumbosacral region 05/19/2015  . Nonallopathic lesion of sacral region 05/19/2015  . Piriformis syndrome of right side 04/24/2015  . Allergic rhinitis due to pollen 04/23/2015  . Extrinsic asthma 08/11/2010  . Insomnia 08/11/2010  . 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 GERD 12/08/2007  . Irritable bowel syndrome 12/08/2007    Social History   Tobacco Use  . Smoking status: Never Smoker  .  Smokeless tobacco: Never Used  Substance  Use Topics  . Alcohol use: Yes    Comment: occasional    Current Outpatient Medications:  .  budesonide-formoterol (SYMBICORT) 160-4.5 MCG/ACT inhaler, Inhale 2 puffs into the lungs 2 (two) times daily., Disp: 3 Inhaler, Rfl: 3 .  dexlansoprazole (DEXILANT) 60 MG capsule, Take 1 capsule (60 mg total) by mouth daily., Disp: 90 capsule, Rfl: 3 .  fluticasone (FLONASE) 50 MCG/ACT nasal spray, Place 2 sprays into both nostrils daily., Disp: 16 g, Rfl: 11 .  gabapentin (NEURONTIN) 100 MG capsule, TAKE 2 CAPSULES (200 MG TOTAL) BY MOUTH AT BEDTIME., Disp: 180 capsule, Rfl: 0 .  Ibuprofen-Famotidine (DUEXIS) 800-26.6 MG TABS, Take 1 tablet by mouth 3 (three) times daily as needed., Disp: 90 tablet, Rfl: 3 .  Melatonin 5 MG TABS, Take by mouth., Disp: , Rfl:  .  TURMERIC PO, Take by mouth 2 (two) times daily., Disp: , Rfl:   No Known Allergies  Objective:   VITALS: Per patient if applicable, see vitals. GENERAL: Alert, appears well and in no acute distress. HEENT: Atraumatic, conjunctiva clear, no obvious abnormalities on inspection of external nose and ears. NECK: Normal movements of the head and neck. CARDIOPULMONARY: No increased WOB. Speaking in clear sentences. I:E ratio WNL.  MS: Moves all visible extremities without noticeable abnormality. PSYCH: Pleasant and cooperative, well-groomed. Speech normal rate and rhythm. Affect is appropriate. Insight and judgement are appropriate. Attention is focused, linear, and appropriate.  NEURO: CN grossly intact. Oriented as arrived to appointment on time with no prompting. Moves both UE equally.  SKIN: No obvious lesions, wounds, erythema, or cyanosis noted on face or hands.  Depression screen [employer] 2/9 03/21/2018 01/10/2017  Decreased Interest 0 0  Down, Depressed, Hopeless 1 0  PHQ - 2 Score 1 0  Altered sleeping 1 -  Tired, decreased energy 0 -  Change in appetite 0 -  Feeling bad or failure about yourself   0 -  Trouble concentrating 0 -  Moving slowly or fidgety/restless 0 -  Suicidal thoughts 0 -  PHQ-9 Score 2 -  Difficult doing work/chores Not difficult at all -   Assessment and Plan:   [name] was seen today for gastroesophageal reflux.  Diagnoses and all orders for this visit:  Gastroesophageal reflux disease without esophagitis Comments: Patient will stop NSAID and add Carafat at night if not improving.  Allergic rhinitis due to pollen, unspecified seasonality  Moderate persistent extrinsic asthma with acute exacerbation Comments: Asthma exacerbation, worse at night and in am, improved with running. DDx allergic rhinitis, GERD, viral. Tx albuterol first.  Orders: -     albuterol (VENTOLIN HFA) 108 (90 Base) MCG/ACT inhaler; Inhale 2 puffs into the lungs every 6 (six) hours as needed for wheezing or shortness of breath.  Marland Kitchen COVID-19 Education: The signs and symptoms of COVID-19 were discussed with the patient and how to seek care for testing if needed. The importance of social distancing was discussed today. . Reviewed expectations re: course of current medical issues. . Discussed self-management of symptoms. . Outlined signs and symptoms indicating need for more acute intervention. . Patient verbalized understanding and all questions were answered. Marland Kitchen Health Maintenance issues including appropriate healthy diet, exercise, and smoking avoidance were discussed with patient. . See orders for this visit as documented in the electronic medical record.  George Deutscher, DO  Records requested if needed. Time spent: 25 minutes, of which >50% was spent in obtaining information about his symptoms, reviewing his previous labs, evaluations, and treatments, counseling him  about his condition (please see the discussed topics above), and developing a plan to further investigate it; he had a number of questions which I addressed.

## 2018-09-22 ENCOUNTER — Ambulatory Visit (INDEPENDENT_AMBULATORY_CARE_PROVIDER_SITE_OTHER): Payer: No Typology Code available for payment source | Admitting: Family Medicine

## 2018-09-22 ENCOUNTER — Other Ambulatory Visit: Payer: Self-pay

## 2018-09-22 VITALS — Ht 68.0 in | Wt 177.0 lb

## 2018-09-22 DIAGNOSIS — J301 Allergic rhinitis due to pollen: Secondary | ICD-10-CM | POA: Diagnosis not present

## 2018-09-22 DIAGNOSIS — J4541 Moderate persistent asthma with (acute) exacerbation: Secondary | ICD-10-CM

## 2018-09-22 DIAGNOSIS — K219 Gastro-esophageal reflux disease without esophagitis: Secondary | ICD-10-CM

## 2018-09-22 MED ORDER — ALBUTEROL SULFATE HFA 108 (90 BASE) MCG/ACT IN AERS: 2.0000 | INHALATION_SPRAY | Freq: Four times a day (QID) | RESPIRATORY_TRACT | Status: DC | PRN

## 2018-09-23 ENCOUNTER — Encounter: Payer: Self-pay | Admitting: Family Medicine

## 2018-09-28 ENCOUNTER — Encounter: Payer: Self-pay | Admitting: Family Medicine

## 2018-09-28 ENCOUNTER — Other Ambulatory Visit: Payer: Self-pay

## 2018-09-28 DIAGNOSIS — J4541 Moderate persistent asthma with (acute) exacerbation: Secondary | ICD-10-CM

## 2018-09-28 MED ORDER — ALBUTEROL SULFATE HFA 108 (90 BASE) MCG/ACT IN AERS: 2.0000 | INHALATION_SPRAY | Freq: Four times a day (QID) | RESPIRATORY_TRACT | Status: DC | PRN

## 2018-09-29 NOTE — Telephone Encounter (Signed)
I spoke with patient and informed him that I had called pharmacy and verbally gave prescription.

## 2018-11-22 ENCOUNTER — Encounter: Payer: Self-pay | Admitting: Family Medicine

## 2018-12-06 ENCOUNTER — Encounter: Payer: Self-pay | Admitting: Family Medicine

## 2018-12-08 ENCOUNTER — Encounter: Payer: Self-pay | Admitting: Family Medicine

## 2018-12-08 ENCOUNTER — Ambulatory Visit: Payer: No Typology Code available for payment source | Admitting: Family Medicine

## 2018-12-08 ENCOUNTER — Other Ambulatory Visit: Payer: Self-pay

## 2018-12-08 VITALS — BP 124/70 | HR 66 | Ht 68.0 in | Wt 184.0 lb

## 2018-12-08 DIAGNOSIS — G5701 Lesion of sciatic nerve, right lower limb: Secondary | ICD-10-CM | POA: Diagnosis not present

## 2018-12-08 DIAGNOSIS — M999 Biomechanical lesion, unspecified: Secondary | ICD-10-CM | POA: Diagnosis not present

## 2018-12-08 DIAGNOSIS — Z23 Encounter for immunization: Secondary | ICD-10-CM | POA: Diagnosis not present

## 2018-12-08 NOTE — Patient Instructions (Addendum)
Rocker bottom shoes with running Tape wrist with working out [employer] on karate until he sees his parents See me in 6 weeks

## 2018-12-08 NOTE — Progress Notes (Signed)
[name] [place], [place] [address] Phone: [phone] Subjective:   I George [name] am serving as a Education administrator for Dr. Hulan Saas.  I'm seeing this patient by the request  of:    CC: Back pain, wrist pain follow-up  [id number]   03/27/2018 Continued poor posture.  Discussed icing regimen and home exercises.  Discussed ergonomics throughout the day.  Responded well from manipulation.  Patient will increase activity slowly.  Follow-up again in 4 weeks  12/08/2018 George [name] is a 53 y.o. male coming in with complaint of hip, back and wrist pain. Patient last seen in January 2020. States that overall he is achy. None of his pain is bad. Patient just wants to maintain and exercise.  Patient is concerned that every time he tries to increase activity he starts having increasing discomfort and pain.     Past Medical History:  Diagnosis Date  . Anxiety   . GERD (gastroesophageal reflux disease)   . Seasonal allergies    Past Surgical History:  Procedure Laterality Date  . UPPER GASTROINTESTINAL ENDOSCOPY     Social History   Socioeconomic History  . Marital status: Married    Spouse name: Not on file  . Number of children: 2  . Years of education: Not on file  . Highest education level: Not on file  Occupational History  . 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  Social Needs  . Financial resource strain: Not on file  . Food insecurity    Worry: Not on file    Inability: Not on file  . Transportation needs    Medical: Not on file    Non-medical: Not on file  Tobacco Use  . Smoking status: Never Smoker  . Smokeless tobacco: Never Used  Substance and Sexual Activity  . Alcohol use: Yes    Comment: occasional   . Drug use: No  . Sexual activity: Yes  Lifestyle  . Physical activity    Days per week: Not on file    Minutes per session: Not on file  . Stress: Not on file   Relationships  . Social Herbalist on phone: Not on file    Gets together: Not on file    Attends religious service: Not on file    Active member of club or organization: Not on file    Attends meetings of clubs or organizations: Not on file    Relationship status: Not on file  Other Topics Concern  . Not on file  Social History Narrative   Regular exercise-yes   No Known Allergies Family History  Problem Relation Age of Onset  . Obesity Father   . Asthma Father   . Hypertension Father   . Bladder Cancer Father   . Colon polyps Mother   . Colon cancer Maternal Grandmother   . Diabetes Neg Hx   . Early death Neg Hx   . Heart disease Neg Hx   . Hyperlipidemia Neg Hx   . Kidney disease Neg Hx   . Stroke Neg Hx   . Esophageal cancer Neg Hx   . Stomach cancer Neg Hx       Current Outpatient Medications (Respiratory):  .  albuterol (VENTOLIN HFA) 108 (90 Base) MCG/ACT inhaler, Inhale 2 puffs into the lungs every 6 (six) hours as needed for wheezing or shortness of breath. .  budesonide-formoterol (SYMBICORT) 160-4.5  MCG/ACT inhaler, Inhale 2 puffs into the lungs 2 (two) times daily. .  fluticasone (FLONASE) 50 MCG/ACT nasal spray, Place 2 sprays into both nostrils daily.  Current Outpatient Medications (Analgesics):  Marland Kitchen  Ibuprofen-Famotidine (DUEXIS) 800-26.6 MG TABS, Take 1 tablet by mouth 3 (three) times daily as needed.   Current Outpatient Medications (Other):  .  dexlansoprazole (DEXILANT) 60 MG capsule, Take 1 capsule (60 mg total) by mouth daily. Marland Kitchen  gabapentin (NEURONTIN) 100 MG capsule, TAKE 2 CAPSULES (200 MG TOTAL) BY MOUTH AT BEDTIME. .  Melatonin 5 MG TABS, Take by mouth.    Past medical history, social, surgical and family history all reviewed in electronic medical record.  No pertanent information unless stated regarding to the chief complaint.   Review of Systems:  No headache, visual changes, nausea, vomiting, diarrhea, constipation, dizziness,  abdominal pain, skin rash, fevers, chills, night sweats, weight loss, swollen lymph nodes, body aches, joint swelling, chest pain, shortness of breath, mood changes.  Positive muscle aches  Objective  Blood pressure 124/70, pulse 66, height 5\' 8"  (1.727 m), weight 184 lb (83.5 kg), SpO2 99 %.    General: No apparent distress alert and oriented x3 mood and affect normal, dressed appropriately.  HEENT: Pupils equal, extraocular movements intact  Respiratory: Patient's speak in full sentences and does not appear short of breath  Cardiovascular: No lower extremity edema, non tender, no erythema  Skin: Warm dry intact with no signs of infection or rash on extremities or on axial skeleton.  Abdomen: Soft nontender  Neuro: Cranial nerves II through XII are intact, neurovascularly intact in all extremities with 2+ DTRs and 2+ pulses.  Lymph: No lymphadenopathy of posterior or anterior cervical chain or axillae bilaterally.  Gait normal with good balance and coordination.  MSK:  Non tender with full range of motion and good stability and symmetric strength and tone of shoulders, elbows, hip, knee and ankles bilaterally.   Right wrist exam shows the patient does have some tender to palpation the ECU area.  Mild pain over the lunate but full range of motion.  Back exam does have some loss of lordosis, tightness, tender to palpation of the paraspinal musculature.  Negative straight leg test, positive Faber test bilaterally.  5-5 strength of the lower extremities.  Osteopathic findings  C4 flexed rotated and side bent left C7 flexed rotated and side bent left T3 extended rotated and side bent right inhaled third rib T5 extended rotated and side bent left L2 flexed rotated and side bent right Sacrum right on right    Impression and Recommendations:     This case required medical decision making of moderate complexity. The above documentation has been reviewed and is accurate and complete [name], DO       Note: This dictation was prepared with Dragon dictation along with smaller phrase technology. Any transcriptional errors that result from this process are unintentional.

## 2018-12-09 ENCOUNTER — Encounter: Payer: Self-pay | Admitting: Family Medicine

## 2018-12-09 NOTE — Assessment & Plan Note (Signed)
Discussed with patient.  Discussed which activities to do which wants to avoid.  Patient will increase activity as tolerated.  Follow-up again in 4 to 8 weeks

## 2018-12-09 NOTE — Assessment & Plan Note (Signed)
Decision today to treat with OMT was based on Physical Exam  After verbal consent patient was treated with HVLA, ME, FPR techniques in cervical, thoracic, rib,  lumbar and sacral areas  Patient tolerated the procedure well with improvement in symptoms  Patient given exercises, stretches and lifestyle modifications  See medications in patient instructions if given  Patient will follow up in 4-8 weeks 

## 2018-12-09 NOTE — Assessment & Plan Note (Signed)
Continue tightness.  Discussed posture and ergonomics.  Discussed home exercises.  Has responded well to osteopathic manipulation in the past and attempted again today.  We discussed core stability.  Discussed avoiding certain activities such as heavy lifting at the moment.  Patient wants to start again with karate and encouraged him to do so but slowly.  Also secondary to the coronavirus outbreak will have to continue to monitor.  Follow-up again in 4 to 8 weeks.

## 2019-01-08 ENCOUNTER — Other Ambulatory Visit: Payer: Self-pay | Admitting: Family Medicine

## 2019-01-11 ENCOUNTER — Other Ambulatory Visit: Payer: Self-pay | Admitting: Family Medicine

## 2019-02-01 NOTE — Progress Notes (Signed)
[name] [place], [name] [address] Phone: [phone] Subjective:   [name], am serving as a scribe for Dr. Hulan Saas.  This visit occurred during the SARS-CoV-2 public health emergency.  Safety protocols were in place, including screening questions prior to the visit, additional usage of staff PPE, and extensive cleaning of exam room while observing appropriate contact time as indicated for disinfecting solutions.     CC: Back pain follow-up  [id number]   12/08/2018 Discussed with patient.  Discussed which activities to do which wants to avoid.  Patient will increase activity as tolerated.  Follow-up again in 4 to 8 weeks  Update 02/02/2019 [name] is a 53 y.o. male coming in with complaint of right wrist pain and back pain. Patient is here for OMT to treat his lower back pain. Patient states that his wrist is doing better.   Having right sided SI joint and low back pain. Dull achy pain throughout the day. Pain 2/10 all day. Pain is less with movement ie. Taekwondo.     Past Medical History:  Diagnosis Date  . Anxiety   . GERD (gastroesophageal reflux disease)   . Seasonal allergies    Past Surgical History:  Procedure Laterality Date  . UPPER GASTROINTESTINAL ENDOSCOPY     Social History   Socioeconomic History  . Marital status: Married    Spouse name: Not on file  . Number of children: 2  . Years of education: Not on file  . Highest education level: Not on file  Occupational History  . 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  Social Needs  . Financial resource strain: Not on file  . Food insecurity    Worry: Not on file    Inability: Not on file  . Transportation needs    Medical: Not on file    Non-medical: Not on file  Tobacco Use  . Smoking status: Never Smoker  . Smokeless tobacco: Never Used  Substance and Sexual Activity  . Alcohol use: Yes   Comment: occasional   . Drug use: No  . Sexual activity: Yes  Lifestyle  . Physical activity    Days per week: Not on file    Minutes per session: Not on file  . Stress: Not on file  Relationships  . Social Herbalist on phone: Not on file    Gets together: Not on file    Attends religious service: Not on file    Active member of club or organization: Not on file    Attends meetings of clubs or organizations: Not on file    Relationship status: Not on file  Other Topics Concern  . Not on file  Social History Narrative   Regular exercise-yes   No Known Allergies Family History  Problem Relation Age of Onset  . Obesity Father   . Asthma Father   . Hypertension Father   . Bladder Cancer Father   . Colon polyps Mother   . Colon cancer Maternal Grandmother   . Diabetes Neg Hx   . Early death Neg Hx   . Heart disease Neg Hx   . Hyperlipidemia Neg Hx   . Kidney disease Neg Hx   . Stroke Neg Hx   . Esophageal cancer Neg Hx   . Stomach cancer Neg Hx       Current Outpatient Medications (Respiratory):  .  albuterol (VENTOLIN HFA) 108 (90 Base) MCG/ACT inhaler, Inhale 2 puffs into the lungs every 6 (six) hours as needed for wheezing or shortness of breath. .  budesonide-formoterol (SYMBICORT) 160-4.5 MCG/ACT inhaler, Inhale 2 puffs into the lungs 2 (two) times daily. .  fluticasone (FLONASE) 50 MCG/ACT nasal spray, Place 2 sprays into both nostrils daily.  Current Outpatient Medications (Analgesics):  Marland Kitchen  Ibuprofen-Famotidine (DUEXIS) 800-26.6 MG TABS, Take 1 tablet by mouth 3 (three) times daily as needed.   Current Outpatient Medications (Other):  .  dexlansoprazole (DEXILANT) 60 MG capsule, Take 1 capsule (60 mg total) by mouth daily. Marland Kitchen  gabapentin (NEURONTIN) 100 MG capsule, TAKE 2 CAPSULES (200 MG TOTAL) BY MOUTH AT BEDTIME. .  Melatonin 5 MG TABS, Take by mouth.    Past medical history, social, surgical and family history all reviewed in electronic  medical record.  No pertanent information unless stated regarding to the chief complaint.   Review of Systems:  No headache, visual changes, nausea, vomiting, diarrhea, constipation, dizziness, abdominal pain, skin rash, fevers, chills, night sweats, weight loss, swollen lymph nodes, body aches, joint swelling, muscle aches, chest pain, shortness of breath, mood changes.  Positive muscle aches  Objective  Blood pressure 106/68, pulse 60, height 5\' 8"  (1.727 m), weight 187 lb (84.8 kg), SpO2 98 %.    General: No apparent distress alert and oriented x3 mood and affect normal, dressed appropriately.  HEENT: Pupils equal, extraocular movements intact  Respiratory: Patient's speak in full sentences and does not appear short of breath  Cardiovascular: No lower extremity edema, non tender, no erythema  Skin: Warm dry intact with no signs of infection or rash on extremities or on axial skeleton.  Abdomen: Soft nontender  Neuro: Cranial nerves II through XII are intact, neurovascularly intact in all extremities with 2+ DTRs and 2+ pulses.  Lymph: No lymphadenopathy of posterior or anterior cervical chain or axillae bilaterally.  Gait normal with good balance and coordination.  MSK:  Non tender with full range of motion and good stability and symmetric strength and tone of shoulders, elbows, wrist, hip, knee and ankles bilaterally.  Back exam does have some tender to palpation of the right patient does have some tightness of the straight leg test.  Mild positive Corky Sox on the right  Osteopathic findings C2 flexed rotated and side bent right C6 flexed rotated and side bent left T3 extended rotated and side bent right inhaled third rib T9 extended rotated and side bent left L2 flexed rotated and side bent right Sacrum right on right     Impression and Recommendations:     This case required medical decision making of moderate complexity. The above documentation has been reviewed and is accurate  and complete [name], DO       Note: This dictation was prepared with Dragon dictation along with smaller phrase technology. Any transcriptional errors that result from this process are unintentional.

## 2019-02-02 ENCOUNTER — Other Ambulatory Visit: Payer: Self-pay

## 2019-02-02 ENCOUNTER — Encounter: Payer: Self-pay | Admitting: Family Medicine

## 2019-02-02 ENCOUNTER — Ambulatory Visit: Payer: No Typology Code available for payment source | Admitting: Family Medicine

## 2019-02-02 VITALS — BP 106/68 | HR 60 | Ht 68.0 in | Wt 187.0 lb

## 2019-02-02 DIAGNOSIS — G5701 Lesion of sciatic nerve, right lower limb: Secondary | ICD-10-CM | POA: Diagnosis not present

## 2019-02-02 DIAGNOSIS — M999 Biomechanical lesion, unspecified: Secondary | ICD-10-CM

## 2019-02-02 NOTE — Patient Instructions (Signed)
Happy [place] Day [employer] cycling app Heel lift in right shoe with walking See me in 6-8 weeks

## 2019-02-02 NOTE — Assessment & Plan Note (Signed)
Discussed HEP  OMT done, Did well did expect which activity decide to do.  Patient will increase activity slowly.  Follow-up with me again 8 to 12 weeks

## 2019-02-02 NOTE — Assessment & Plan Note (Signed)
Decision today to treat with OMT was based on Physical Exam  After verbal consent patient was treated with HVLA, ME, FPR techniques in cervical, thoracic, lumbar and sacral areas  Patient tolerated the procedure well with improvement in symptoms  Patient given exercises, stretches and lifestyle modifications  See medications in patient instructions if given  Patient will follow up in 8-12 weeks

## 2019-02-04 ENCOUNTER — Other Ambulatory Visit: Payer: Self-pay

## 2019-02-04 DIAGNOSIS — Z20822 Contact with and (suspected) exposure to covid-19: Secondary | ICD-10-CM

## 2019-02-05 LAB — NOVEL CORONAVIRUS, NAA: SARS-CoV-2, NAA: NOT DETECTED

## 2019-05-03 ENCOUNTER — Ambulatory Visit: Payer: No Typology Code available for payment source | Admitting: Family Medicine

## 2019-05-17 ENCOUNTER — Other Ambulatory Visit: Payer: Self-pay

## 2019-05-17 ENCOUNTER — Ambulatory Visit: Payer: No Typology Code available for payment source | Admitting: Family Medicine

## 2019-05-17 ENCOUNTER — Encounter: Payer: Self-pay | Admitting: Family Medicine

## 2019-05-17 VITALS — BP 130/80 | HR 64 | Ht 68.0 in | Wt 185.0 lb

## 2019-05-17 DIAGNOSIS — L84 Corns and callosities: Secondary | ICD-10-CM | POA: Insufficient documentation

## 2019-05-17 DIAGNOSIS — K219 Gastro-esophageal reflux disease without esophagitis: Secondary | ICD-10-CM | POA: Diagnosis not present

## 2019-05-17 DIAGNOSIS — R293 Abnormal posture: Secondary | ICD-10-CM | POA: Diagnosis not present

## 2019-05-17 DIAGNOSIS — M999 Biomechanical lesion, unspecified: Secondary | ICD-10-CM | POA: Diagnosis not present

## 2019-05-17 MED ORDER — VITAMIN D (ERGOCALCIFEROL) 1.25 MG (50000 UNIT) PO CAPS: 50000.0000 [IU] | ORAL_CAPSULE | ORAL | 0 refills | Status: DC

## 2019-05-17 NOTE — Assessment & Plan Note (Signed)
Worsening symptoms, encouraged him to follow-up with his gastroenterologist.

## 2019-05-17 NOTE — Assessment & Plan Note (Signed)
Decision today to treat with OMT was based on Physical Exam  After verbal consent patient was treated with HVLA, ME, FPR techniques in cervical, thoracic, rib,  lumbar and sacral areas  Patient tolerated the procedure well with improvement in symptoms  Patient given exercises, stretches and lifestyle modifications  See medications in patient instructions if given  Patient will follow up in 4-8 weeks 

## 2019-05-17 NOTE — Patient Instructions (Signed)
Good to see you Wart remover cream with a bandaid for a week then shave it off Do not lace last eye on shoe Once weekly vitamin D Double up anti reflux meds for 2 weeks See me again in 6-8 weeks

## 2019-05-17 NOTE — Assessment & Plan Note (Signed)
Liquid nitrogen used today. Discussed over-the-counter orthotics and his custom orthotics proper lacing her shoes. Total time with patient today greater than 40 minutes

## 2019-05-17 NOTE — Progress Notes (Signed)
[facility] [address] [place] Phone: [phone] Subjective:   I George [name] am serving as a Education administrator for Dr. Hulan Saas.   I'm seeing this patient by the request  of:  Patient, No Pcp Per  CC: Multiple small complaints  [id number]  George [name] is a 54 y.o. male coming in with complaint of back pain. Last seen on 02/02/2020 for OMT. Patient states he has had mid back pain with side bending. Saturday and Sunday he noticed pain between the shoulder blade. Took some pain meds that resolved the pain. Right wrist pain.  Patient has been doing taekwondo on a regular basis.  Feels like this is contributing to some of the discomfort.  More neck pain.  Seems to be in the posterior aspect of the shoulder blades.  Worse when laying down flat.  It may be associated with some of the reflux.  No radiation to the arms.  No numbness or tingling.  Does feel stress has been higher with work  Foot pain, left side.  Has had a fracture of the fourth metatarsal.  Also has had a callus on the plantar aspect of the foot that seems to be worsening.     Past Medical History:  Diagnosis Date  . Anxiety   . GERD (gastroesophageal reflux disease)   . Seasonal allergies    Past Surgical History:  Procedure Laterality Date  . UPPER GASTROINTESTINAL ENDOSCOPY     Social History   Socioeconomic History  . Marital status: Married    Spouse name: Not on file  . Number of children: 2  . Years of education: Not on file  . Highest education level: Not on file  Occupational History  . 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  Tobacco Use  . Smoking status: Never Smoker  . Smokeless tobacco: Never Used  Substance and Sexual Activity  . Alcohol use: Yes    Comment: occasional   . Drug use: No  . Sexual activity: Yes  Other Topics Concern  . Not on file  Social History Narrative   Regular exercise-yes    Social Determinants of Health   Financial Resource Strain:   . Difficulty of Paying Living Expenses: Not on file  Food Insecurity:   . Worried About Charity fundraiser in the Last Year: Not on file  . Ran Out of Food in the Last Year: Not on file  Transportation Needs:   . Lack of Transportation (Medical): Not on file  . Lack of Transportation (Non-Medical): Not on file  Physical Activity:   . Days of Exercise per Week: Not on file  . Minutes of Exercise per Session: Not on file  Stress:   . Feeling of Stress : Not on file  Social Connections:   . Frequency of Communication with Friends and Family: Not on file  . Frequency of Social Gatherings with Friends and Family: Not on file  . Attends Religious Services: Not on file  . Active Member of Clubs or Organizations: Not on file  . Attends Archivist Meetings: Not on file  . Marital Status: Not on file   No Known Allergies Family History  Problem Relation Age of Onset  . Obesity Father   . Asthma Father   . Hypertension Father   . Bladder Cancer Father   . Colon polyps Mother   . Colon cancer Maternal Grandmother   .  Diabetes Neg Hx   . Early death Neg Hx   . Heart disease Neg Hx   . Hyperlipidemia Neg Hx   . Kidney disease Neg Hx   . Stroke Neg Hx   . Esophageal cancer Neg Hx   . Stomach cancer Neg Hx       Current Outpatient Medications (Respiratory):  .  albuterol (VENTOLIN HFA) 108 (90 Base) MCG/ACT inhaler, Inhale 2 puffs into the lungs every 6 (six) hours as needed for wheezing or shortness of breath. .  budesonide-formoterol (SYMBICORT) 160-4.5 MCG/ACT inhaler, Inhale 2 puffs into the lungs 2 (two) times daily. .  fluticasone (FLONASE) 50 MCG/ACT nasal spray, Place 2 sprays into both nostrils daily.  Current Outpatient Medications (Analgesics):  Marland Kitchen  Ibuprofen-Famotidine (DUEXIS) 800-26.6 MG TABS, Take 1 tablet by mouth 3 (three) times daily as needed.   Current Outpatient Medications (Other):   .  dexlansoprazole (DEXILANT) 60 MG capsule, Take 1 capsule (60 mg total) by mouth daily. Marland Kitchen  gabapentin (NEURONTIN) 100 MG capsule, TAKE 2 CAPSULES (200 MG TOTAL) BY MOUTH AT BEDTIME. .  Melatonin 5 MG TABS, Take by mouth. .  Vitamin D, Ergocalciferol, (DRISDOL) 1.25 MG (50000 UNIT) CAPS capsule, Take 1 capsule (50,000 Units total) by mouth every 7 (seven) days.   Reviewed prior external information including notes and imaging from  primary care provider As well as notes that were available from care everywhere and other healthcare systems.  Past medical history, social, surgical and family history all reviewed in electronic medical record.  No pertanent information unless stated regarding to the chief complaint.   Review of Systems:  No headache, visual changes, nausea, vomiting, diarrhea, constipation, dizziness, abdominal pain, skin rash, fevers, chills, night sweats, weight loss, swollen lymph nodes, body aches, joint swelling, chest pain, shortness of breath, mood changes. POSITIVE muscle aches  Objective  Blood pressure 130/80, pulse 64, height 5\' 8"  (1.727 m), weight 185 lb (83.9 kg), SpO2 98 %.   General: No apparent distress alert and oriented x3 mood and affect normal, dressed appropriately.  HEENT: Pupils equal, extraocular movements intact  Respiratory: Patient's speak in full sentences and does not appear short of breath  Cardiovascular: No lower extremity edema, non tender, no erythema  Skin: Warm dry intact with no signs of infection or rash on extremities or on axial skeleton.  Abdomen: Soft nontender  Neuro: Cranial nerves II through XII are intact, neurovascularly intact in all extremities with 2+ DTRs and 2+ pulses.  Lymph: No lymphadenopathy of posterior or anterior cervical chain or axillae bilaterally.  Gait normal with good balance and coordination.  MSK:  Non tender with full range of motion and good stability and symmetric strength and tone of shoulders, elbows,  wrist, hip, knee and ankles bilaterally.  Left foot exam shows the patient does have breakdown of the transverse arch with callus formation noted.  This is on the plantar aspect of the third toe.  Neck exam does have some mild loss of lordosis, tenderness to palpation.  More in the paraspinal musculature.  Negative Spurling's.  5-5 strength of the upper extremities.  Osteopathic findings  C6 flexed rotated and side bent left T7 extended rotated and side bent right inhaled rib T9 extended rotated and side bent left L2 flexed rotated and side bent right Sacrum right on right    Impression and Recommendations:     This case required medical decision making of moderate complexity. The above documentation has been reviewed and is accurate and  complete George Pulley, DO       Note: This dictation was prepared with Dragon dictation along with smaller phrase technology. Any transcriptional errors that result from this process are unintentional.

## 2019-05-17 NOTE — Assessment & Plan Note (Signed)
Poor posture overall. Discussed with patient about posture and ergonomics, we discussed different working environments. We discussed that stress is likely playing a role as well. Patient is having increasing reflux disease and will follow up with his gastroenterologist. Patient did respond well to osteopathic manipulation. Follow-up again in 4 to 8 weeks.

## 2019-05-19 ENCOUNTER — Ambulatory Visit: Payer: PRIVATE HEALTH INSURANCE | Attending: Internal Medicine

## 2019-05-19 DIAGNOSIS — Z23 Encounter for immunization: Secondary | ICD-10-CM | POA: Insufficient documentation

## 2019-05-19 NOTE — Progress Notes (Signed)
   Covid-19 Vaccination Clinic  Name:  George [name]    MRN: [id number] DOB: August 26, 1965  05/19/2019  [name] was observed post Covid-19 immunization for 15 minutes without incident. He was provided with Vaccine Information Sheet and instruction to access the V-Safe system.   [name] was instructed to call 911 with any severe reactions post vaccine: Marland Kitchen Difficulty breathing  . Swelling of face and throat  . A fast heartbeat  . A bad rash all over body  . Dizziness and weakness   Immunizations Administered    Name Date Dose VIS Date Route   Pfizer COVID-19 Vaccine 05/19/2019  4:22 PM 0.3 mL 02/23/2019 Intramuscular   Manufacturer: [place]   Lot: [id number]   [place]: [id number]

## 2019-05-21 NOTE — Telephone Encounter (Signed)
Patient called in this afternoon saying George [name] has spoken with Dr.Hunter about scheduling a TOC from [name], is this okay to schedule?

## 2019-05-31 ENCOUNTER — Other Ambulatory Visit: Payer: Self-pay

## 2019-05-31 ENCOUNTER — Ambulatory Visit: Payer: No Typology Code available for payment source | Admitting: Nurse Practitioner

## 2019-05-31 ENCOUNTER — Encounter: Payer: Self-pay | Admitting: Nurse Practitioner

## 2019-05-31 VITALS — BP 124/78 | HR 63 | Temp 97.6°F | Ht 69.0 in | Wt 186.0 lb

## 2019-05-31 DIAGNOSIS — K219 Gastro-esophageal reflux disease without esophagitis: Secondary | ICD-10-CM

## 2019-05-31 DIAGNOSIS — R0789 Other chest pain: Secondary | ICD-10-CM

## 2019-05-31 MED ORDER — METHYLPREDNISOLONE 4 MG PO TBPK: ORAL_TABLET | ORAL | 0 refills | Status: DC

## 2019-05-31 NOTE — Patient Instructions (Signed)
If you are age 53 or older, your body mass index should be between 23-30. Your Body mass index is 27.47 kg/m. If this is out of the aforementioned range listed, please consider follow up with your Primary Care Provider.  If you are age 38 or younger, your body mass index should be between 19-25. Your Body mass index is 27.47 kg/m. If this is out of the aformentioned range listed, please consider follow up with your Primary Care Provider.   We have sent the following medications to your pharmacy for you to pick up at your convenience: Medrol Dose Pack - take as directed.  Call Dr. Vena Rua nurse in a couple of weeks with condition update.  Thank you for choosing me and [facility].   Tye Savoy, NP

## 2019-05-31 NOTE — Progress Notes (Signed)
IMPRESSION and PLAN:    #GERD --Symptoms controlled on Dexilant, occasionally requires a dose of Pepcid  # Recurrent chest pain. Exam c/w with musculoskeletal pain. --Localized tenderness to left lower anterior ribcage.  Will repeat a course of Medrol Dosepak 4 mg --Patient will call in 7 to 10 days with a condition update  HPI:    Primary GI: Dr. 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   He also has a history of anxiety and size induced asthma . For GERD he is maintained on Dexilant, occasionally adds a dose of Pepcid Though patient has GERD, some of his chest pain has been felt to be musculoskeletal. In January 2020 he presented with what was thought to be costochondritis /  chest wall pain which responded nicely to a  Medrol dose pack.  Following that he did fairly well until several weeks ago when chest pain started to reoccur.  Over the last 2 weeks the pain has progressed.  The chest discomfort is in lower left chest.  Seems like the discomfort is worse on an empty stomach, exercise helps the pain. He has not tried any anti-inflammatories for the pain.  He has a dry cough.  No shortness of breath .  Pain is not exacerbated by coughing or deep breaths . A couple of weeks ago,  on Saturday,  he woke up with mid upper back pain.  On Sunday he took a muscle relaxer and the pain subsided. Patient subsequently saw Dr. Hulan Saas who felt the pain was secondary to GERD despite its responsiveness to a muscle relaxer.  His PPI was doubled but patient said it made him constipated and he just did not feel well on higher dose.  Does wonder if stress may be contributing to the pain.  He has noticed that the pain gets worse in the afternoon, he never has it at night.   Review of systems:     no SOB, no fevers, no urinary sx   Past Medical History:  Diagnosis Date  . Anxiety   . Colon polyps   . GERD (gastroesophageal reflux disease)   . Seasonal allergies     Patient's surgical history, family medical history, social history, medications and allergies were all reviewed in Epic   Creatinine clearance cannot be calculated (Patient's most recent lab result is older than the maximum 21 days allowed.)  Current Outpatient Medications  Medication Sig Dispense Refill  . albuterol (VENTOLIN HFA) 108 (90 Base) MCG/ACT inhaler Inhale 2 puffs into the lungs every 6 (six) hours as needed for wheezing or shortness of breath. 18 g g  . budesonide-formoterol (SYMBICORT) 160-4.5 MCG/ACT inhaler Inhale 2 puffs into the lungs 2 (two) times daily. (Patient taking differently: Inhale 2 puffs into the lungs 2 (two) times daily. As needed) 3 Inhaler 3  . dexlansoprazole (DEXILANT) 60 MG capsule Take 1 capsule (60 mg total) by mouth daily. 90 capsule 3  . fluticasone (FLONASE) 50 MCG/ACT nasal spray Place 2 sprays into both nostrils daily. 16 g 11  . gabapentin (NEURONTIN) 100 MG capsule TAKE 2 CAPSULES (200 MG TOTAL) BY MOUTH AT BEDTIME. 180 capsule 1  . Ibuprofen-Famotidine (DUEXIS) 800-26.6 MG TABS Take 1 tablet by mouth 3 (three) times daily as  needed. 90 tablet 3  . Melatonin 5 MG TABS Take by mouth.    . Vitamin D, Ergocalciferol, (DRISDOL) 1.25 MG (50000 UNIT) CAPS capsule Take 1 capsule (50,000 Units total) by mouth every 7 (seven) days. 12 capsule 0  . methylPREDNISolone (MEDROL DOSEPAK) 4 MG TBPK tablet Take as directed 21 each 0   No current facility-administered medications for this visit.    Physical Exam:     BP 124/78   Pulse 63   Temp 97.6 F (36.4 C)   Ht 5\' 9"  (1.753 m)   Wt 186 lb (84.4 kg)   BMI 27.47 kg/m   GENERAL:  Pleasant male in NAD PSYCH: : Cooperative, normal affect CARDIAC:  RRR, no murmur heard, no peripheral edema PULM: Normal respiratory effort, lungs  CTA bilaterally, no wheezing ABDOMEN:  Nondistended, soft, nontender. No obvious masses, no hepatomegaly,  normal bowel sounds SKIN:  turgor, no lesions seen Musculoskeletal:  Normal muscle tone, normal strength. Left lower left anterior rib cage with localized tenderness ( approximately 2" left of sternum) NEURO: Alert and oriented x 3, no focal neurologic deficits   Tye Savoy , NP 05/31/2019, 5:43 PM

## 2019-06-02 NOTE — Progress Notes (Signed)
Addendum: Reviewed and agree with assessment and management plan. Dajuan Turnley M, MD  

## 2019-06-04 ENCOUNTER — Other Ambulatory Visit: Payer: Self-pay

## 2019-06-04 ENCOUNTER — Encounter: Payer: Self-pay | Admitting: Family Medicine

## 2019-06-04 MED ORDER — DUEXIS 800-26.6 MG PO TABS: 1.0000 | ORAL_TABLET | Freq: Three times a day (TID) | ORAL | 3 refills | Status: DC | PRN

## 2019-06-09 ENCOUNTER — Ambulatory Visit: Payer: PRIVATE HEALTH INSURANCE | Attending: Internal Medicine

## 2019-06-09 DIAGNOSIS — Z23 Encounter for immunization: Secondary | ICD-10-CM

## 2019-06-09 NOTE — Progress Notes (Signed)
   Covid-19 Vaccination Clinic  Name:  George [name]    MRN: [id number] DOB: Jan 13, 1966  06/09/2019  Mr. George [name] was observed post Covid-19 immunization for 15 minutes without incident. He was provided with Vaccine Information Sheet and instruction to access the V-Safe system.   Mr. George [name] was instructed to call 911 with any severe reactions post vaccine: Marland Kitchen Difficulty breathing  . Swelling of face and throat  . A fast heartbeat  . A bad rash all over body  . Dizziness and weakness   Immunizations Administered    Name Date Dose VIS Date Route   Pfizer COVID-19 Vaccine 06/09/2019 12:58 PM 0.3 mL 02/23/2019 Intramuscular   Manufacturer: [employer]   Lot: [id number]   [place]: [id number]

## 2019-06-11 ENCOUNTER — Ambulatory Visit: Payer: No Typology Code available for payment source

## 2019-06-12 ENCOUNTER — Telehealth: Payer: Self-pay | Admitting: *Deleted

## 2019-06-12 NOTE — Telephone Encounter (Signed)
We have received a fax from [employer] [facility] [employer] patient assistance program indicating that patient's patient assistance form had been received and approved for free medication (Dexilant) through 06/11/2020.  Case Number [id number]

## 2019-06-18 ENCOUNTER — Ambulatory Visit: Payer: No Typology Code available for payment source

## 2019-07-03 ENCOUNTER — Other Ambulatory Visit: Payer: Self-pay

## 2019-07-03 ENCOUNTER — Ambulatory Visit (INDEPENDENT_AMBULATORY_CARE_PROVIDER_SITE_OTHER): Payer: PRIVATE HEALTH INSURANCE | Admitting: Family Medicine

## 2019-07-03 ENCOUNTER — Encounter: Payer: Self-pay | Admitting: Family Medicine

## 2019-07-03 VITALS — BP 130/80 | HR 71 | Temp 98.9°F | Ht 69.0 in | Wt 198.2 lb

## 2019-07-03 DIAGNOSIS — K219 Gastro-esophageal reflux disease without esophagitis: Secondary | ICD-10-CM

## 2019-07-03 DIAGNOSIS — F320 Major depressive disorder, single episode, mild: Secondary | ICD-10-CM

## 2019-07-03 DIAGNOSIS — G4700 Insomnia, unspecified: Secondary | ICD-10-CM | POA: Diagnosis not present

## 2019-07-03 DIAGNOSIS — R0683 Snoring: Secondary | ICD-10-CM

## 2019-07-03 DIAGNOSIS — E538 Deficiency of other specified B group vitamins: Secondary | ICD-10-CM | POA: Diagnosis not present

## 2019-07-03 DIAGNOSIS — E559 Vitamin D deficiency, unspecified: Secondary | ICD-10-CM | POA: Diagnosis not present

## 2019-07-03 DIAGNOSIS — E785 Hyperlipidemia, unspecified: Secondary | ICD-10-CM | POA: Insufficient documentation

## 2019-07-03 DIAGNOSIS — J4541 Moderate persistent asthma with (acute) exacerbation: Secondary | ICD-10-CM

## 2019-07-03 DIAGNOSIS — J4520 Mild intermittent asthma, uncomplicated: Secondary | ICD-10-CM

## 2019-07-03 DIAGNOSIS — J301 Allergic rhinitis due to pollen: Secondary | ICD-10-CM

## 2019-07-03 MED ORDER — ESCITALOPRAM OXALATE 5 MG PO TABS: 5.0000 mg | ORAL_TABLET | Freq: Every day | ORAL | 5 refills | Status: DC

## 2019-07-03 MED ORDER — ALBUTEROL SULFATE HFA 108 (90 BASE) MCG/ACT IN AERS: 2.0000 | INHALATION_SPRAY | Freq: Four times a day (QID) | RESPIRATORY_TRACT | 2 refills | Status: DC | PRN

## 2019-07-03 NOTE — Assessment & Plan Note (Signed)
S: Patient is  exercising regularly and doing his best to eat a healthy diet.   Lab Results  Component Value Date   CHOL 215 (H) 03/23/2018   HDL 47.40 03/23/2018   LDLCALC 144 (H) 03/23/2018   LDLDIRECT 159.3 06/25/2011   TRIG 119.0 03/23/2018   CHOLHDL 5 03/23/2018   A/P: update lipid panel with labs. We will calculate 10 year ascvd risk. As long as under 7.5% would remain off meds.

## 2019-07-03 NOTE — Progress Notes (Signed)
Phone: [phone]   Subjective:  Patient presents today to establish care with me as their new primary care provider. Patient was formerly a patient of Dr. [name].  Chief Complaint  Patient presents with  . Est Care    TOC from [facility]  . discuss SSRI  . sleep issues    wife states he snores alot   See problem oriented charting  The following were reviewed and entered/updated in epic: Past Medical History:  Diagnosis Date  . Anxiety   . Colon polyps   . GERD (gastroesophageal reflux disease)   . Seasonal allergies    Patient Active Problem List   Diagnosis Date Noted  . Hyperlipidemia, unspecified 07/03/2019    Priority: Medium  . Vitamin D deficiency 03/21/2018    Priority: Medium  . B12 deficiency 03/21/2018    Priority: Medium  . Allergic rhinitis due to pollen 04/23/2015    Priority: Medium  . Extrinsic asthma 08/11/2010    Priority: Medium  . Insomnia 08/11/2010    Priority: Medium  . GERD 12/08/2007    Priority: Medium  . Irritable bowel syndrome 12/08/2007    Priority: Medium  . Callus of foot 05/17/2019    Priority: Low  . Poor posture 12/22/2017    Priority: Low  . Capsulitis of metatarsophalangeal (MTP) joint of left foot 12/22/2017    Priority: Low  . Nonallopathic lesion of cervical region 10/28/2017    Priority: Low  . Nonallopathic lesion of rib cage 10/28/2017    Priority: Low  . Tibialis posterior tendinitis, right 08/30/2017    Priority: Low  . Extensor intersection syndrome of right wrist 02/24/2017    Priority: Low  . ECU (extensor carpi ulnaris), subluxation/dislocation, right, initial encounter 07/19/2016    Priority: Low  . Lumbar radiculopathy 08/14/2015    Priority: Low  . Nonallopathic lesion of thoracic region 05/19/2015    Priority: Low  . Nonallopathic lesion of lumbosacral region 05/19/2015    Priority: Low  . Nonallopathic lesion of sacral region 05/19/2015    Priority: Low  . Piriformis syndrome of right side  04/24/2015    Priority: Low   Past Surgical History:  Procedure Laterality Date  . COLONOSCOPY    . UPPER GASTROINTESTINAL ENDOSCOPY      Family History  Problem Relation Age of Onset  . Obesity Father   . Asthma Father   . Hypertension Father   . Bladder Cancer Father   . CAD Father        prior smoker. cabg in late 70s  . Colon polyps Mother   . Healthy Sister   . Bipolar disorder Brother   . Colon cancer Maternal Grandmother   . Diabetes Neg Hx   . Early death Neg Hx   . Heart disease Neg Hx   . Hyperlipidemia Neg Hx   . Kidney disease Neg Hx   . Stroke Neg Hx   . Esophageal cancer Neg Hx   . Stomach cancer Neg Hx     Medications- reviewed and updated Current Outpatient Medications  Medication Sig Dispense Refill  . albuterol (VENTOLIN HFA) 108 (90 Base) MCG/ACT inhaler Inhale 2 puffs into the lungs every 6 (six) hours as needed for wheezing or shortness of breath. 18 g 2  . budesonide-formoterol (SYMBICORT) 160-4.5 MCG/ACT inhaler Inhale 2 puffs into the lungs 2 (two) times daily. (Patient taking differently: Inhale 2 puffs into the lungs 2 (two) times daily. As needed) 3 Inhaler 3  . dexlansoprazole (DEXILANT) 60 MG capsule  Take 1 capsule (60 mg total) by mouth daily. 90 capsule 3  . fluticasone (FLONASE) 50 MCG/ACT nasal spray Place 2 sprays into both nostrils daily. 16 g 11  . gabapentin (NEURONTIN) 100 MG capsule TAKE 2 CAPSULES (200 MG TOTAL) BY MOUTH AT BEDTIME. 180 capsule 1  . Ibuprofen-Famotidine (DUEXIS) 800-26.6 MG TABS Take 1 tablet by mouth 3 (three) times daily as needed. 90 tablet 3  . Melatonin 5 MG TABS Take by mouth.    . Vitamin D, Ergocalciferol, (DRISDOL) 1.25 MG (50000 UNIT) CAPS capsule Take 1 capsule (50,000 Units total) by mouth every 7 (seven) days. 12 capsule 0  . escitalopram (LEXAPRO) 5 MG tablet Take 1 tablet (5 mg total) by mouth daily. 30 tablet 5   No current facility-administered medications for this visit.    Allergies-reviewed and  updated No Known Allergies  Social History   Social History Narrative   Married. 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       [place]: tae kwon do, exercise, movies, hiking   Objective  Objective:  BP 130/80   Pulse 71   Temp 98.9 F (37.2 C)   Ht 5\' 9"  (1.753 m)   Wt 198 lb 3.2 oz (89.9 kg)   SpO2 98%   BMI 29.27 kg/m  Gen: NAD, resting comfortably HEENT: Mucous membranes are moist. Oropharynx normal Neck: no thyromegaly CV: RRR no murmurs rubs or gallops Lungs: CTAB no crackles, wheeze, rhonchi Abdomen: soft/nontender/nondistended/normal bowel sounds. No rebound or guarding.  Ext: no edema Skin: warm, dry Neuro: grossly normal, moves all extremities, PERRLA   Assessment and Plan:    # Depression S:  Patient has been working with a therapist. Some issues with depression/anxiety- sometimes manifests with irritability. 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   Therapist suggested he consider SSRI. He is concerned about side effects. Had a brother who was bipolar-no history of hypomania or mania- brother was adopted.  Depression screen [facility] 2/9 07/03/2019 03/21/2018 01/10/2017  Decreased Interest 0 0 0  Down, Depressed, Hopeless 1 1 0  PHQ - 2 Score 1 1 0  Altered sleeping 3 1 -  Tired, decreased energy 2 0 -  Change in appetite 0 0 -  Feeling bad or failure about yourself  0 0 -  Trouble concentrating 1 0 -  Moving slowly or fidgety/restless 0 0 -  Suicidal thoughts 0 0 -  PHQ-9 Score 7 2 -  Difficult doing work/chores Somewhat difficult Not difficult at all -  A/P: Mild depressive symptoms as well as anxiety.  PHQ-9 mildly elevated at 7 despite counseling.  His therapist has recommended consideration of SSRI.  We discussed possibly using Wellbutrin 150 mg extended release but I was slightly concerned it may worsen anxiety.  We also discussed Lexapro 5 mg and we opted for this choice-he will follow  up in 4 to 6 weeks for recheck  # heavy snoring S: Long-term heavy snoring.  No excessive daytime sleepiness A/P: I wonder with patient's depressive symptoms and irritability if this could be related to underlying sleep apnea-referral was placed to pulmonology for OSA evaluation   Vitamin D deficiency S: Patient has needed boost of vitamin D per Dr. Tamala Julian in last few years A/P: currently on treatment- consider updated vitamin D with future labs    GERD S: Patient follows with Dr. Hilarie Fredrickson.  He has tried other  therapies that have not been as effective as Dexilant-pretty much Dexilant have been the only effective medicine for him. A/P: Stable - continue current medicine.    Insomnia S: reasonable control on melatonin 5 mg. Gabapentin through Dr. Tamala Julian also helps with nighttime cramping. Poor sleep if he does not take these medicines- wakes up 1-2 hours after falling asleep and cannot go back to sleep  A/P: reasonable control- continue current medicines   Extrinsic asthma S: uses albuterol before exercise at times- uses once a mont. Also has symbicort on hand not using regularly A/P: Mild intermittent asthma.  Continue albuterol on an as-needed basis.  We discussed if he did have an illness and had increased wheezing he could use Symbicort for a week with that illness I think   B12 deficiency S:  Reported issues in the past Lab Results  Component Value Date   VITAMINB12 485 03/23/2018  A/P: last levels looked good- repeat with bloodwork   Allergic rhinitis due to pollen S: saline irrigation a few times a week in addition to zyrtec and flonase.  A/P: reasonable control- continue current meds   Hyperlipidemia, unspecified S: Patient is  exercising regularly and doing his best to eat a healthy diet.   Lab Results  Component Value Date   CHOL 215 (H) 03/23/2018   HDL 47.40 03/23/2018   LDLCALC 144 (H) 03/23/2018   LDLDIRECT 159.3 06/25/2011   TRIG 119.0 03/23/2018   CHOLHDL 5  03/23/2018   A/P: update lipid panel with labs. We will calculate 10 year ascvd risk. As long as under 7.5% would remain off meds.       Recommended follow up: 4 to 6 weeks recommended Future Appointments  Date Time Provider [place]  07/11/2019  7:45 AM [name], DO [facility] None    Lab/Order associations:   ICD-10-CM   1. Depression, major, single episode, mild ([place])  F32.0   2. Vitamin D deficiency  E55.9 VITAMIN D 25 Hydroxy (Vit-D Deficiency, Fractures)  3. Gastroesophageal reflux disease without esophagitis  K21.9   4. Insomnia, unspecified type  G47.00   5. Mild intermittent extrinsic asthma without complication  A999333   6. Moderate persistent extrinsic asthma with acute exacerbation  J45.41 albuterol (VENTOLIN HFA) 108 (90 Base) MCG/ACT inhaler   Asthma exacerbation, worse at night and in am, improved with running. DDx allergic rhinitis, GERD, viral. Tx albuterol first.   7. B12 deficiency  E53.8 Vitamin B12  8. Allergic rhinitis due to pollen, unspecified seasonality  J30.1   9. Hyperlipidemia, unspecified hyperlipidemia type  E78.5 CBC with Differential/Platelet    Comprehensive metabolic panel    Lipid panel    TSH  10. Snoring  R06.83 Ambulatory referral to Pulmonology    Meds ordered this encounter  Medications  . albuterol (VENTOLIN HFA) 108 (90 Base) MCG/ACT inhaler    Sig: Inhale 2 puffs into the lungs every 6 (six) hours as needed for wheezing or shortness of breath.    Dispense:  18 g    Refill:  2  . escitalopram (LEXAPRO) 5 MG tablet    Sig: Take 1 tablet (5 mg total) by mouth daily.    Dispense:  30 tablet    Refill:  5   Return precautions advised.  Garret Reddish, MD

## 2019-07-03 NOTE — Patient Instructions (Addendum)
We will call you within two weeks about your referral to pulmonology for snoring evaluation. If you do not hear within 3 weeks, give [place] a call.    Taking the medicine as directed and not missing any doses is one of the best things you can do to treat your anxiety/depression.  Here are some things to keep in mind:  1) Side effects (stomach upset, some increased anxiety) may happen before you notice a benefit.  These side effects typically go away over time. 2) Changes to your dose of medicine or a change in medication all together is sometimes necessary 3) Most people need to be on medication at least 6-12 months 4) Many people will notice an improvement within two weeks but the full effect of the medication can take up to 4-6 weeks 5) Stopping the medication when you start feeling better often results in a return of symptoms 6) If you start having thoughts of hurting yourself or others after starting this medicine, call our office immediately at [phone] or seek care through 911.     Please stop by lab before you go If you do not have mychart- we will call you about results within 5 business days of [place] receiving them.  If you have mychart- we will send your results within 3 business days of [place] receiving them.  If abnormal or we want to clarify a result, we will call or mychart you to make sure you receive the message.  If you have questions or concerns or don't hear within 5 business days, please send [place] a message or call us.

## 2019-07-03 NOTE — Assessment & Plan Note (Addendum)
S: reasonable control on melatonin 5 mg. Gabapentin through Dr. Tamala Julian also helps with nighttime cramping. Poor sleep if he does not take these medicines- wakes up 1-2 hours after falling asleep and cannot go back to sleep  A/P: reasonable control- continue current medicines

## 2019-07-03 NOTE — Assessment & Plan Note (Signed)
S: Patient has needed boost of vitamin D per Dr. Tamala Julian in last few years A/P: currently on treatment- consider updated vitamin D with future labs

## 2019-07-03 NOTE — Assessment & Plan Note (Signed)
S:  Reported issues in the past Lab Results  Component Value Date   VITAMINB12 485 03/23/2018  A/P: last levels looked good- repeat with bloodwork

## 2019-07-03 NOTE — Assessment & Plan Note (Signed)
S: saline irrigation a few times a week in addition to zyrtec and flonase.  A/P: reasonable control- continue current meds

## 2019-07-03 NOTE — Assessment & Plan Note (Signed)
S: uses albuterol before exercise at times- uses once a mont. Also has symbicort on hand not using regularly A/P: Mild intermittent asthma.  Continue albuterol on an as-needed basis.  We discussed if he did have an illness and had increased wheezing he could use Symbicort for a week with that illness I think

## 2019-07-03 NOTE — Assessment & Plan Note (Signed)
S: Patient follows with Dr. Hilarie Fredrickson.  He has tried other therapies that have not been as effective as Dexilant-pretty much Dexilant have been the only effective medicine for him. A/P: Stable - continue current medicine.

## 2019-07-04 LAB — CBC WITH DIFFERENTIAL/PLATELET
Basophils Absolute: 0.1 10*3/uL (ref 0.0–0.1)
Basophils Relative: 0.8 % (ref 0.0–3.0)
Eosinophils Absolute: 0.1 10*3/uL (ref 0.0–0.7)
Eosinophils Relative: 0.9 % (ref 0.0–5.0)
HCT: 43.5 % (ref 39.0–52.0)
Hemoglobin: 14.7 g/dL (ref 13.0–17.0)
Lymphocytes Relative: 23.5 % (ref 12.0–46.0)
Lymphs Abs: 2.2 10*3/uL (ref 0.7–4.0)
MCHC: 33.7 g/dL (ref 30.0–36.0)
MCV: 92.7 fl (ref 78.0–100.0)
Monocytes Absolute: 0.7 10*3/uL (ref 0.1–1.0)
Monocytes Relative: 7.4 % (ref 3.0–12.0)
Neutro Abs: 6.4 10*3/uL (ref 1.4–7.7)
Neutrophils Relative %: 67.4 % (ref 43.0–77.0)
Platelets: 252 10*3/uL (ref 150.0–400.0)
RBC: 4.69 Mil/uL (ref 4.22–5.81)
RDW: 13.1 % (ref 11.5–15.5)
WBC: 9.5 10*3/uL (ref 4.0–10.5)

## 2019-07-04 LAB — LIPID PANEL
Cholesterol: 231 mg/dL — ABNORMAL HIGH (ref 0–200)
HDL: 47.8 mg/dL (ref >39.00)
LDL Cholesterol: 148 mg/dL — ABNORMAL HIGH (ref 0–99)
NonHDL: 183.42
Total CHOL/HDL Ratio: 5
Triglycerides: 177 mg/dL — ABNORMAL HIGH (ref 0.0–149.0)
VLDL: 35.4 mg/dL (ref 0.0–40.0)

## 2019-07-04 LAB — COMPREHENSIVE METABOLIC PANEL
ALT: 17 U/L (ref 0–53)
AST: 21 U/L (ref 0–37)
Albumin: 4.5 g/dL (ref 3.5–5.2)
Alkaline Phosphatase: 89 U/L (ref 39–117)
BUN: 25 mg/dL — ABNORMAL HIGH (ref 6–23)
CO2: 28 mEq/L (ref 19–32)
Calcium: 9.2 mg/dL (ref 8.4–10.5)
Chloride: 103 mEq/L (ref 96–112)
Creatinine, Ser: 1.29 mg/dL (ref 0.40–1.50)
GFR: 58.22 mL/min — ABNORMAL LOW (ref >60.00)
Glucose, Bld: 96 mg/dL (ref 70–99)
Potassium: 5 mEq/L (ref 3.5–5.1)
Sodium: 140 mEq/L (ref 135–145)
Total Bilirubin: 0.6 mg/dL (ref 0.2–1.2)
Total Protein: 7.1 g/dL (ref 6.0–8.3)

## 2019-07-04 LAB — TSH: TSH: 2.13 u[IU]/mL (ref 0.35–4.50)

## 2019-07-04 LAB — VITAMIN B12: Vitamin B-12: 303 pg/mL (ref 211–911)

## 2019-07-04 LAB — VITAMIN D 25 HYDROXY (VIT D DEFICIENCY, FRACTURES): VITD: 44.87 ng/mL (ref 30.00–100.00)

## 2019-07-05 ENCOUNTER — Encounter: Payer: Self-pay | Admitting: Family Medicine

## 2019-07-05 ENCOUNTER — Other Ambulatory Visit: Payer: Self-pay

## 2019-07-05 DIAGNOSIS — G4762 Sleep related leg cramps: Secondary | ICD-10-CM

## 2019-07-11 ENCOUNTER — Other Ambulatory Visit: Payer: Self-pay

## 2019-07-11 ENCOUNTER — Ambulatory Visit (INDEPENDENT_AMBULATORY_CARE_PROVIDER_SITE_OTHER): Payer: PRIVATE HEALTH INSURANCE | Admitting: Family Medicine

## 2019-07-11 ENCOUNTER — Encounter: Payer: Self-pay | Admitting: Family Medicine

## 2019-07-11 VITALS — BP 104/60 | HR 61 | Ht 69.0 in | Wt 179.0 lb

## 2019-07-11 DIAGNOSIS — M999 Biomechanical lesion, unspecified: Secondary | ICD-10-CM

## 2019-07-11 DIAGNOSIS — M5416 Radiculopathy, lumbar region: Secondary | ICD-10-CM | POA: Diagnosis not present

## 2019-07-11 MED ORDER — MELOXICAM 15 MG PO TABS: 15.0000 mg | ORAL_TABLET | Freq: Every day | ORAL | 0 refills | Status: DC

## 2019-07-11 NOTE — Patient Instructions (Signed)
See me in August

## 2019-07-11 NOTE — Assessment & Plan Note (Signed)
Patient is stable at this time.  Discussed icing regimen and home exercise, which activities to do which wants to avoid.  Patient will be traveling for a longer amount of time.  Change patient's oral anti-inflammatories to meloxicam.  Discontinue the Duexis.  Follow-up again in 12 weeks

## 2019-07-11 NOTE — Assessment & Plan Note (Signed)

## 2019-07-11 NOTE — Progress Notes (Signed)
[place] Phone: [phone] Subjective:   George [name], am serving as a scribe for Dr. Hulan Saas. This visit occurred during the SARS-CoV-2 public health emergency.  Safety protocols were in place, including screening questions prior to the visit, additional usage of staff PPE, and extensive cleaning of exam room while observing appropriate contact time as indicated for disinfecting solutions.    I'm seeing this patient by the request  of:  Marin Olp, MD  CC: Low back pain  [id number]  George [name] is a 54 y.o. male coming in with complaint of back pain. Last seen on 05/17/2019 for OMT. Patient states that he is doing well.  Patient has been able to exercise on a regular basis.  Trying to lose weight.  Feels like he is in a good place with his new primary care provider as well.  Attempting to use the anti-inflammatory less frequently.  Wanting to know if there is any other type of other medication that could be beneficial.       Past Medical History:  Diagnosis Date  . Anxiety   . Colon polyps   . GERD (gastroesophageal reflux disease)   . Seasonal allergies    Past Surgical History:  Procedure Laterality Date  . COLONOSCOPY    . UPPER GASTROINTESTINAL ENDOSCOPY     Social History   Socioeconomic History  . Marital status: Married    Spouse name: Not on file  . Number of children: 2  . Years of education: Not on file  . Highest education level: Not on file  Occupational History  . 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  Tobacco Use  . Smoking status: Never Smoker  . Smokeless tobacco: Never Used  Substance and Sexual Activity  . Alcohol use: Yes    Comment: occasional   . Drug use: [name]  . Sexual activity: Yes    Comment: wife   Other Topics Concern  . Not on file  Social History Narrative   Married. 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       [place]: tae kwon do, exercise, movies, hiking   Social Determinants of Health   Financial Resource Strain:   . Difficulty of Paying Living Expenses:   Food Insecurity:   . Worried About Charity fundraiser in the Last Year:   . Arboriculturist in the Last Year:   Transportation Needs:   . Film/video editor (Medical):   Marland Kitchen Lack of Transportation (Non-Medical):   Physical Activity:   . Days of Exercise per Week:   . Minutes of Exercise per Session:   Stress:   . Feeling of Stress :   Social Connections:   . Frequency of Communication with Friends and Family:   . Frequency of Social Gatherings with Friends and Family:   . Attends Religious Services:   . Active Member of Clubs or Organizations:   . Attends Archivist Meetings:   Marland Kitchen Marital Status:    [name] Known Allergies Family History  Problem Relation Age of Onset  . Obesity Father   . Asthma Father   . Hypertension Father   . Bladder Cancer Father   . CAD Father        prior smoker. cabg in late 70s  . Colon polyps  Mother   . Healthy Sister   . Bipolar disorder Brother   . Colon cancer Maternal Grandmother   . Diabetes Neg Hx   . Early death Neg Hx   . Heart disease Neg Hx   . Hyperlipidemia Neg Hx   . Kidney disease Neg Hx   . Stroke Neg Hx   . Esophageal cancer Neg Hx   . Stomach cancer Neg Hx       Current Outpatient Medications (Respiratory):  .  albuterol (VENTOLIN HFA) 108 (90 Base) MCG/ACT inhaler, Inhale 2 puffs into the lungs every 6 (six) hours as needed for wheezing or shortness of breath. .  budesonide-formoterol (SYMBICORT) 160-4.5 MCG/ACT inhaler, Inhale 2 puffs into the lungs 2 (two) times daily. (Patient taking differently: Inhale 2 puffs into the lungs 2 (two) times daily. As needed) .  fluticasone (FLONASE) 50 MCG/ACT nasal spray, Place 2 sprays into both nostrils daily.  Current Outpatient Medications (Analgesics):  .  meloxicam (MOBIC)  15 MG tablet, Take 1 tablet (15 mg total) by mouth daily.   Current Outpatient Medications (Other):  .  dexlansoprazole (DEXILANT) 60 MG capsule, Take 1 capsule (60 mg total) by mouth daily. Marland Kitchen  escitalopram (LEXAPRO) 5 MG tablet, Take 1 tablet (5 mg total) by mouth daily. Marland Kitchen  gabapentin (NEURONTIN) 100 MG capsule, TAKE 2 CAPSULES (200 MG TOTAL) BY MOUTH AT BEDTIME. .  Melatonin 5 MG TABS, Take by mouth. .  Vitamin D, Ergocalciferol, (DRISDOL) 1.25 MG (50000 UNIT) CAPS capsule, Take 1 capsule (50,000 Units total) by mouth every 7 (seven) days.   Reviewed prior external information including notes and imaging from  primary care provider As well as notes that were available from care everywhere and other healthcare systems.  Past medical history, social, surgical and family history all reviewed in electronic medical record.  [name] pertanent information unless stated regarding to the chief complaint.   Review of Systems:  [name] headache, visual changes, nausea, vomiting, diarrhea, constipation, dizziness, abdominal pain, skin rash, fevers, chills, night sweats, weight loss, swollen lymph nodes, body aches, joint swelling, chest pain, shortness of breath, mood changes. POSITIVE muscle aches  Objective  Blood pressure 104/60, pulse 61, height 5\' 9"  (1.753 m), weight 179 lb (81.2 kg), SpO2 97 %.   General: [name] apparent distress alert and oriented x3 mood and affect normal, dressed appropriately.  HEENT: Pupils equal, extraocular movements intact  Respiratory: Patient's speak in full sentences and does not appear short of breath  Cardiovascular: [name] lower extremity edema, non tender, [name] erythema  Neuro: Cranial nerves II through XII are intact, neurovascularly intact in all extremities with 2+ DTRs and 2+ pulses.  Gait normal with good balance and coordination.  MSK:  Non tender with full range of motion and good stability and symmetric strength and tone of shoulders, elbows, wrist, hip, knee and ankles  bilaterally.  Neck exam mild loss of lordosis.  Tender to palpation in the paraspinal musculature lumbar spine right greater than left.  Negative.  Patient does have some mild tightness with Corky Sox test bilaterally.  Mild tightness of the right piriformis still noted.  Osteopathic findings C2 flexed rotated and side bent right T4 extended rotated and side bent left inhaled rib T9 extended rotated and side bent left L2 flexed rotated and side bent right Sacrum right on right    Impression and Recommendations:     This case required medical decision making of moderate complexity. The above documentation has been reviewed and is accurate and  complete [name], DO       Note: This dictation was prepared with Dragon dictation along with smaller phrase technology. Any transcriptional errors that result from this process are unintentional.

## 2019-07-27 ENCOUNTER — Other Ambulatory Visit: Payer: Self-pay | Admitting: Family Medicine

## 2019-08-01 ENCOUNTER — Other Ambulatory Visit: Payer: Self-pay

## 2019-08-01 ENCOUNTER — Encounter: Payer: Self-pay | Admitting: Family Medicine

## 2019-08-01 ENCOUNTER — Other Ambulatory Visit: Payer: Self-pay | Admitting: Family Medicine

## 2019-08-01 MED ORDER — ESCITALOPRAM OXALATE 5 MG PO TABS: 5.0000 mg | ORAL_TABLET | Freq: Every day | ORAL | 0 refills | Status: DC

## 2019-08-06 NOTE — Progress Notes (Signed)
Phone [phone] In person visit   Subjective:   George [name] is a 54 y.o. year old very pleasant male patient who presents for/with See problem oriented charting Chief Complaint  Patient presents with  . Depression  . B12 Injection   This visit occurred during the SARS-CoV-2 public health emergency.  Safety protocols were in place, including screening questions prior to the visit, additional usage of staff PPE, and extensive cleaning of exam room while observing appropriate contact time as indicated for disinfecting solutions.   Past Medical History-  Patient Active Problem List   Diagnosis Date Noted  . Hyperlipidemia, unspecified 07/03/2019    Priority: Medium  . Vitamin D deficiency 03/21/2018    Priority: Medium  . B12 deficiency 03/21/2018    Priority: Medium  . Allergic rhinitis due to pollen 04/23/2015    Priority: Medium  . Extrinsic asthma 08/11/2010    Priority: Medium  . Insomnia 08/11/2010    Priority: Medium  . GERD 12/08/2007    Priority: Medium  . Irritable bowel syndrome 12/08/2007    Priority: Medium  . Callus of foot 05/17/2019    Priority: Low  . Poor posture 12/22/2017    Priority: Low  . Capsulitis of metatarsophalangeal (MTP) joint of left foot 12/22/2017    Priority: Low  . Nonallopathic lesion of cervical region 10/28/2017    Priority: Low  . Nonallopathic lesion of rib cage 10/28/2017    Priority: Low  . Tibialis posterior tendinitis, right 08/30/2017    Priority: Low  . Extensor intersection syndrome of right wrist 02/24/2017    Priority: Low  . ECU (extensor carpi ulnaris), subluxation/dislocation, right, initial encounter 07/19/2016    Priority: Low  . Lumbar radiculopathy 08/14/2015    Priority: Low  . Nonallopathic lesion of thoracic region 05/19/2015    Priority: Low  . Nonallopathic lesion of lumbosacral region 05/19/2015    Priority: Low  . Nonallopathic lesion of sacral region 05/19/2015    Priority: Low  . Piriformis  syndrome of right side 04/24/2015    Priority: Low    Medications- reviewed and updated Current Outpatient Medications  Medication Sig Dispense Refill  . albuterol (VENTOLIN HFA) 108 (90 Base) MCG/ACT inhaler Inhale 2 puffs into the lungs every 6 (six) hours as needed for wheezing or shortness of breath. 18 g 2  . budesonide-formoterol (SYMBICORT) 160-4.5 MCG/ACT inhaler Inhale 2 puffs into the lungs 2 (two) times daily. (Patient taking differently: Inhale 2 puffs into the lungs 2 (two) times daily. As needed) 3 Inhaler 3  . dexlansoprazole (DEXILANT) 60 MG capsule Take 1 capsule (60 mg total) by mouth daily. 90 capsule 3  . escitalopram (LEXAPRO) 10 MG tablet Take 0.5 tablets (5 mg total) by mouth daily. 90 tablet 3  . fluticasone (FLONASE) 50 MCG/ACT nasal spray Place 2 sprays into both nostrils daily. 16 g 11  . gabapentin (NEURONTIN) 100 MG capsule TAKE 2 CAPSULES (200 MG TOTAL) BY MOUTH AT BEDTIME. 180 capsule 1  . Melatonin 5 MG TABS Take by mouth.     No current facility-administered medications for this visit.     Objective:  BP 110/62   Pulse (!) 56   Temp 97.9 F (36.6 C) (Temporal)   Ht 5\' 9"  (1.753 m)   Wt 178 lb (80.7 kg)   SpO2 98%   BMI 26.29 kg/m  Gen: NAD, resting comfortably  Knee: Normal to inspection with no erythema or effusion or obvious bony abnormalities. Palpation normal with no warmth or joint  line tenderness (other than medial joint line tenderness) or patellar tenderness or condyle tenderness. ROM normal in flexion and extension and lower leg rotation. Ligaments with solid consistent endpoints including ACL, PCL, LCL, MCL. Negative Mcmurray's and provocative meniscal tests. Non painful patellar compression. Patellar and quadriceps tendons unremarkable. Hamstring and quadriceps strength is normal.    Assessment and Plan    # Depression with anxiety S: Medication:Lexapro 5 mg.  We also have considered Wellbutrin and 50 mg but were worried this  would worsen anxiety Counseling: Patient continues with therapist  Still getting some frustration but not as angry as he used to be. No side effects so far other than mild GI side effects and some odd dreams and remembering them more but not distressing him. Not violent and not acting out. Feels he could increase dose and som does therapist Depression screen [facility] 2/9 08/09/2019 07/03/2019 03/21/2018  Decreased Interest 0 0 0  Down, Depressed, Hopeless 1 1 1   PHQ - 2 Score 1 1 1   Altered sleeping 1 3 1   Tired, decreased energy 1 2 0  Change in appetite 0 0 0  Feeling bad or failure about yourself  0 0 0  Trouble concentrating 0 1 0  Moving slowly or fidgety/restless 0 0 0  Suicidal thoughts 0 0 0  PHQ-9 Score 3 7 2   Difficult doing work/chores Not difficult at all Somewhat difficult Not difficult at all  A/P: Anxiety and depressed mood have improved on Lexapro 5 mg but still not at ideal control for patient.  In discussions with his therapist would like to see some more improvement and due to minimal side effects thinks he can tolerate 10 mg of Lexapro-we will increase to that dose and recheck in 3 months.  If he has any thoughts of self-harm he will let us know immediately  #Heavy snoring-placed referral to pulmonology last visit-patient reports never got a cal- wants to wait until next visit - returns in august   # right knee pain medial- not really bad but doesn't want issues to worsen.  Intermittent right knee pain since last visit- has been taking less ibuprofen overall and this may have "unmasked pain" -Knee exam reassuring today other than medial joint line tenderness concerning for arthritis- he defers x-ray for now.  We are going to try Voltaren gel 4 times a day for 7 days to see if we can cool off the knee-I did not recommend any firm limitations on his exercises as I do want him to follow-up if he has new or worsening symptoms with Dr. Tamala Julian  #Elevated BUN at last visit-patient has  tried to decrease NSAIDs-has been taking ibuprofen regularly.  We are going to try Voltaren as above for his knee.  I am going to recheck his kidney function to make sure it is at least stable. Trying to stay hydrated -on dexilant which can be associated with kidney changes but does not tolerate other medicines  Recommended follow up: Return in about 3 months (around 11/09/2019) for physical or sooner if needed. Future Appointments  Date Time Provider [place]  10/29/2019  4:00 PM [name], DO [facility] None    Lab/Order associations:   ICD-10-CM   1. Depression, major, single episode, mild ([place])  F32.0   2. Vitamin D deficiency  E55.9   3. Hyperlipidemia, unspecified hyperlipidemia type  E78.5   4. Elevated BUN  [id number] Basic metabolic panel    Meds ordered this encounter  Medications  .  escitalopram (LEXAPRO) 10 MG tablet    Sig: Take 0.5 tablets (5 mg total) by mouth daily.    Dispense:  90 tablet    Refill:  3   Return precautions advised.  Garret Reddish, MD

## 2019-08-06 NOTE — Patient Instructions (Addendum)
Return in about 3 months (around 11/09/2019) for physical or sooner if needed.  increase Lexapro to 10 mg  Try voltaren gel OTC for the knee- 4x a day for a week then as needed  Please stop by lab before you go If you have mychart- we will send your results within 3 business days of [place] receiving them.  If you do not have mychart- we will call you about results within 5 business days of [place] receiving them.

## 2019-08-09 ENCOUNTER — Other Ambulatory Visit: Payer: Self-pay

## 2019-08-09 ENCOUNTER — Ambulatory Visit (INDEPENDENT_AMBULATORY_CARE_PROVIDER_SITE_OTHER): Payer: PRIVATE HEALTH INSURANCE | Admitting: Family Medicine

## 2019-08-09 ENCOUNTER — Encounter: Payer: Self-pay | Admitting: Family Medicine

## 2019-08-09 VITALS — BP 110/62 | HR 56 | Temp 97.9°F | Ht 69.0 in | Wt 178.0 lb

## 2019-08-09 DIAGNOSIS — K219 Gastro-esophageal reflux disease without esophagitis: Secondary | ICD-10-CM

## 2019-08-09 DIAGNOSIS — F320 Major depressive disorder, single episode, mild: Secondary | ICD-10-CM

## 2019-08-09 DIAGNOSIS — M25561 Pain in right knee: Secondary | ICD-10-CM | POA: Diagnosis not present

## 2019-08-09 DIAGNOSIS — R799 Abnormal finding of blood chemistry, unspecified: Secondary | ICD-10-CM | POA: Diagnosis not present

## 2019-08-09 LAB — BASIC METABOLIC PANEL
BUN: 24 mg/dL — ABNORMAL HIGH (ref 6–23)
CO2: 32 mEq/L (ref 19–32)
Calcium: 9.4 mg/dL (ref 8.4–10.5)
Chloride: 104 mEq/L (ref 96–112)
Creatinine, Ser: 1.12 mg/dL (ref 0.40–1.50)
GFR: 68.5 mL/min (ref >60.00)
Glucose, Bld: 92 mg/dL (ref 70–99)
Potassium: 4.7 mEq/L (ref 3.5–5.1)
Sodium: 138 mEq/L (ref 135–145)

## 2019-08-09 MED ORDER — ESCITALOPRAM OXALATE 10 MG PO TABS: 5.0000 mg | ORAL_TABLET | Freq: Every day | ORAL | 3 refills | Status: DC

## 2019-08-13 ENCOUNTER — Encounter: Payer: Self-pay | Admitting: Family Medicine

## 2019-08-14 ENCOUNTER — Encounter: Payer: Self-pay | Admitting: Family Medicine

## 2019-08-14 ENCOUNTER — Ambulatory Visit: Payer: PRIVATE HEALTH INSURANCE | Admitting: Family Medicine

## 2019-08-14 ENCOUNTER — Ambulatory Visit: Payer: Self-pay

## 2019-08-14 ENCOUNTER — Other Ambulatory Visit: Payer: Self-pay

## 2019-08-14 VITALS — BP 122/80 | HR 61 | Ht 69.0 in | Wt 180.6 lb

## 2019-08-14 DIAGNOSIS — M25561 Pain in right knee: Secondary | ICD-10-CM | POA: Diagnosis not present

## 2019-08-14 NOTE — Patient Instructions (Signed)
Thank you for coming in today. Plan to continue voltaren gel.  Up to 4x daily as needed.  Consider body helix full knee sleeve. Wear during anf for 30-60 mins following exercise.  Recheck or contact me as needed.

## 2019-08-14 NOTE — Progress Notes (Signed)
   I, George [name], LAT, ATC, am serving as scribe for Dr. Lynne Leader.  George [name] is a 54 y.o. male who presents to [place] at [facility] today for R medial knee pain.  He was last seen by Dr. Tamala Julian on 07/11/19 for OMT for his low back.  Since then, pt reports that his R medial knee has been bothering him for a few weeks but worse over the past few days.  He recalls no specific MOI.  He states that he is getting ready to leave to [place] for the summer.  R knee swelling: No R knee mechanical symptoms: No Aggravating factors: running downhill; hard planting; side lunge to the R  Treatments tried: Voltaren gel; Advil; ice   Pertinent review of systems: No fevers or chills  Relevant historical information: IBS   Exam:  BP 122/80 (BP Location: Right Arm, Patient Position: Sitting, Cuff Size: Normal)   Pulse 61   Ht 5\' 9"  (1.753 m)   Wt 180 lb 9.6 oz (81.9 kg)   SpO2 97%   BMI 26.67 kg/m  General: Well Developed, well nourished, and in no acute distress.   MSK: Right knee normal-appearing without effusion. Range of motion 0-120 degrees without significant crepitation. Mildly tender to palpation medial joint line. Stable ligamentous exam. Negative McMurray's test. Intact strength.    Lab and Radiology Results  Diagnostic Limited MSK Ultrasound of: Right knee Quad tendon intact normal-appearing No significant joint effusion. Patellar tendon normal-appearing Medial joint line mildly narrowed.  Medial meniscus with possible partial tear. Lateral joint line normal. Posterior knee no Baker's cyst. Impression: Medial compartment DJD with possible medial meniscus tear    Assessment and Plan: 54 y.o. male with right knee pain mild.  Degenerative changes apparent on ultrasound.  However pain is pretty mild and already improving.  I do not see the utility of steroid injection at this point.  Fortunately he will be traveling starting next week which makes  backup plans a bit more challenging.  Plan for Voltaren gel and compressive knee sleeve and quad strengthening exercises.  If not better could prescribe a medication such as meloxicam or even prednisone to [place] where he will be the summer.  Recheck back with me or Dr. Tamala Julian as needed   PDMP not reviewed this encounter. Orders Placed This Encounter  Procedures  . [place] LIMITED JOINT SPACE STRUCTURES LOW RIGHT(NO LINKED CHARGES)    Order Specific Question:   Reason for Exam (SYMPTOM  OR DIAGNOSIS REQUIRED)    Answer:   R medial knee pain    Order Specific Question:   Preferred imaging location?    Answer:   [place]   No orders of the defined types were placed in this encounter.    Discussed warning signs or symptoms. Please see discharge instructions. Patient expresses understanding.   The above documentation has been reviewed and is accurate and complete Lynne Leader, M.D.

## 2019-09-07 ENCOUNTER — Other Ambulatory Visit: Payer: Self-pay

## 2019-09-07 MED ORDER — ESCITALOPRAM OXALATE 10 MG PO TABS: 10.0000 mg | ORAL_TABLET | Freq: Every day | ORAL | 3 refills | Status: DC

## 2019-10-29 ENCOUNTER — Other Ambulatory Visit: Payer: Self-pay | Admitting: Family Medicine

## 2019-10-29 ENCOUNTER — Ambulatory Visit: Payer: PRIVATE HEALTH INSURANCE | Admitting: Family Medicine

## 2019-11-02 ENCOUNTER — Other Ambulatory Visit: Payer: Self-pay

## 2019-11-02 ENCOUNTER — Ambulatory Visit: Payer: PRIVATE HEALTH INSURANCE | Admitting: Family Medicine

## 2019-11-02 ENCOUNTER — Encounter: Payer: Self-pay | Admitting: Family Medicine

## 2019-11-02 ENCOUNTER — Ambulatory Visit (INDEPENDENT_AMBULATORY_CARE_PROVIDER_SITE_OTHER): Payer: PRIVATE HEALTH INSURANCE

## 2019-11-02 VITALS — BP 112/78 | HR 70 | Ht 69.0 in | Wt 180.0 lb

## 2019-11-02 DIAGNOSIS — M999 Biomechanical lesion, unspecified: Secondary | ICD-10-CM | POA: Diagnosis not present

## 2019-11-02 DIAGNOSIS — M25561 Pain in right knee: Secondary | ICD-10-CM

## 2019-11-02 DIAGNOSIS — G8929 Other chronic pain: Secondary | ICD-10-CM

## 2019-11-02 DIAGNOSIS — M6289 Other specified disorders of muscle: Secondary | ICD-10-CM | POA: Diagnosis not present

## 2019-11-02 NOTE — Patient Instructions (Addendum)
Hamstring exercises Thigh compression sleeve with running or working out Heel lift in right shoe Try Voltaren gel 7-8 weeks

## 2019-11-02 NOTE — Progress Notes (Signed)
[place] Phone: [phone] Subjective:   [name], am serving as a scribe for Dr. Hulan Saas. This visit occurred during the SARS-CoV-2 public health emergency.  Safety protocols were in place, including screening questions prior to the visit, additional usage of staff PPE, and extensive cleaning of exam room while observing appropriate contact time as indicated for disinfecting solutions.   I'm seeing this patient by the request  of:  Marin Olp, MD  CC: Back and neck pain follow-up, knee pain  [id number]  Kaiyden Simkin is a 54 y.o. male coming in with complaint of back and neck pain. OMT 07/11/2019. Patient states that his back pain has been manageable.   Pain in medial aspect of right knee. Worse with running on concrete. Ok with running on treadmill. Pain is constant.  States that if he goes up or down a hill does seem to be going worse especially with hiking downhill.          Reviewed prior external information including notes and imaging from previsou exam, outside providers and external EMR if available.   As well as notes that were available from care everywhere and other healthcare systems.  Past medical history, social, surgical and family history all reviewed in electronic medical record.  No pertanent information unless stated regarding to the chief complaint.   Past Medical History:  Diagnosis Date  . Anxiety   . Colon polyps   . GERD (gastroesophageal reflux disease)   . Seasonal allergies     No Known Allergies   Review of Systems:  No headache, visual changes, nausea, vomiting, diarrhea, constipation, dizziness, abdominal pain, skin rash, fevers, chills, night sweats, weight loss, swollen lymph nodes, body aches, joint swelling, chest pain, shortness of breath, mood changes. POSITIVE muscle aches  Objective  There were no vitals taken for this visit.   General: No  apparent distress alert and oriented x3 mood and affect normal, dressed appropriately.  HEENT: Pupils equal, extraocular movements intact  Respiratory: Patient's speak in full sentences and does not appear short of breath  Cardiovascular: No lower extremity edema, non tender, no erythema  Neuro: Cranial nerves II through XII are intact, neurovascularly intact in all extremities with 2+ DTRs and 2+ pulses.  Gait normal with good balance and coordination.  MSK: Right knee exam shows the patient does have a negative McMurray's.  Full range of motion.  Very minimal pain over the Pez anserine and the medial joint line tightness of the hamstring compared to the contralateral side  Back exam does show some loss of lordosis, tender to palpation in the paraspinal musculature lumbar spine right greater than left  Osteopathic findings   C6 flexed rotated and side bent left T3 extended rotated and side bent right inhaled rib T5 extended rotated and side bent left L3 flexed rotated and side bent right Sacrum left on left      Assessment and Plan:  Hamstring tightness of right lower extremity Right side likely causing more of a Pez anserine and more of a tendinopathy.  Discussed with patient about icing regimen, home exercise, which activities to do which wants to avoid.  Patient is to increase activity slowly over the course the next several weeks.  Patient will do more of a thigh compression sleeve and a heel lift.  Worsening symptoms patient will come back and will consider formal physical therapy and further evaluation with ultrasound.  Follow-up again  in 6 to 8 weeks    Nonallopathic problems  Decision today to treat with OMT was based on Physical Exam  After verbal consent patient was treated with HVLA, ME, FPR techniques in cervical, rib, thoracic, lumbar, and sacral  areas  Patient tolerated the procedure well with improvement in symptoms  Patient given exercises, stretches and  lifestyle modifications  See medications in patient instructions if given  Patient will follow up in 4-8 weeks      The above documentation has been reviewed and is accurate and complete Jacqualin Combes       Note: This dictation was prepared with Dragon dictation along with smaller phrase technology. Any transcriptional errors that result from this process are unintentional.

## 2019-11-02 NOTE — Assessment & Plan Note (Signed)
Right side likely causing more of a Pez anserine and more of a tendinopathy.  Discussed with patient about icing regimen, home exercise, which activities to do which wants to avoid.  Patient is to increase activity slowly over the course the next several weeks.  Patient will do more of a thigh compression sleeve and a heel lift.  Worsening symptoms patient will come back and will consider formal physical therapy and further evaluation with ultrasound.  Follow-up again in 6 to 8 weeks

## 2019-11-12 ENCOUNTER — Encounter: Payer: Self-pay | Admitting: Family Medicine

## 2019-11-26 NOTE — Progress Notes (Signed)
Phone: [phone]   Subjective:  Patient presents today for their annual physical. Chief complaint-noted.   See problem oriented charting- Review of Systems  Constitutional: Positive for malaise/fatigue. Negative for chills and fever.  HENT: Positive for congestion.        Rhinorrhea, sinus pressure- yearly issues with allergies- about 2 weeks of symptoms   Eyes: Negative for blurred vision and double vision.       Eye itching with allergies   Respiratory: Negative for cough and wheezing.   Cardiovascular: Negative for chest pain and palpitations.  Gastrointestinal: Negative for abdominal pain, nausea and vomiting.  Genitourinary: Negative for dysuria and urgency.  Musculoskeletal: Negative for myalgias and neck pain.  Skin: Negative for itching and rash.  Neurological: Negative for dizziness and headaches.  Endo/Heme/Allergies: Positive for environmental allergies. Negative for polydipsia. Does not bruise/bleed easily.  Psychiatric/Behavioral: Negative for depression and suicidal ideas. The patient is nervous/anxious.     The following were reviewed and entered/updated in epic: Past Medical History:  Diagnosis Date  . Anxiety   . Colon polyps   . GERD (gastroesophageal reflux disease)   . Seasonal allergies    Patient Active Problem List   Diagnosis Date Noted  . Hyperlipidemia, unspecified 07/03/2019    Priority: Medium  . Vitamin D deficiency 03/21/2018    Priority: Medium  . B12 deficiency 03/21/2018    Priority: Medium  . Allergic rhinitis due to pollen 04/23/2015    Priority: Medium  . Extrinsic asthma 08/11/2010    Priority: Medium  . Insomnia 08/11/2010    Priority: Medium  . GERD 12/08/2007    Priority: Medium  . Irritable bowel syndrome 12/08/2007    Priority: Medium  . Callus of foot 05/17/2019    Priority: Low  . Poor posture 12/22/2017    Priority: Low  . Capsulitis of metatarsophalangeal (MTP) joint of left foot 12/22/2017    Priority: Low  .  Nonallopathic lesion of cervical region 10/28/2017    Priority: Low  . Nonallopathic lesion of rib cage 10/28/2017    Priority: Low  . Tibialis posterior tendinitis, right 08/30/2017    Priority: Low  . Extensor intersection syndrome of right wrist 02/24/2017    Priority: Low  . ECU (extensor carpi ulnaris), subluxation/dislocation, right, initial encounter 07/19/2016    Priority: Low  . Lumbar radiculopathy 08/14/2015    Priority: Low  . Nonallopathic lesion of thoracic region 05/19/2015    Priority: Low  . Nonallopathic lesion of lumbosacral region 05/19/2015    Priority: Low  . Nonallopathic lesion of sacral region 05/19/2015    Priority: Low  . Piriformis syndrome of right side 04/24/2015    Priority: Low  . Hamstring tightness of right lower extremity 11/02/2019   Past Surgical History:  Procedure Laterality Date  . COLONOSCOPY    . UPPER GASTROINTESTINAL ENDOSCOPY      Family History  Problem Relation Age of Onset  . Obesity Father   . Asthma Father   . Hypertension Father   . Bladder Cancer Father   . CAD Father        prior smoker. cabg in late 70s  . Colon polyps Mother   . Healthy Sister   . Bipolar disorder Brother   . Colon cancer Maternal Grandmother   . Diabetes Neg Hx   . Early death Neg Hx   . Heart disease Neg Hx   . Hyperlipidemia Neg Hx   . Kidney disease Neg Hx   . Stroke Neg Hx   .  Esophageal cancer Neg Hx   . Stomach cancer Neg Hx     Medications- reviewed and updated Current Outpatient Medications  Medication Sig Dispense Refill  . albuterol (VENTOLIN HFA) 108 (90 Base) MCG/ACT inhaler Inhale 2 puffs into the lungs every 6 (six) hours as needed for wheezing or shortness of breath. 18 g 2  . budesonide-formoterol (SYMBICORT) 160-4.5 MCG/ACT inhaler Inhale 2 puffs into the lungs 2 (two) times daily. (Patient taking differently: Inhale 2 puffs into the lungs 2 (two) times daily. As needed) 3 Inhaler 3  . dexlansoprazole (DEXILANT) 60 MG  capsule Take 1 capsule (60 mg total) by mouth daily. 90 capsule 3  . escitalopram (LEXAPRO) 10 MG tablet Take 1 tablet (10 mg total) by mouth daily. 90 tablet 3  . fluticasone (FLONASE) 50 MCG/ACT nasal spray Place 2 sprays into both nostrils daily. 16 g 11  . gabapentin (NEURONTIN) 100 MG capsule TAKE 2 CAPSULES (200 MG TOTAL) BY MOUTH AT BEDTIME. 180 capsule 1  . Melatonin 5 MG TABS Take by mouth.     No current facility-administered medications for this visit.    Allergies-reviewed and updated No Known Allergies  Social History   Social History Narrative   Married. 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       Hobbes: tae kwon do, exercise, movies, hiking   Objective  Objective:  BP 108/70   Pulse 67   Temp 98.2 F (36.8 C) (Temporal)   Resp 18   Ht 5\' 9"  (1.753 m)   Wt 179 lb 9.6 oz (81.5 kg)   SpO2 94%   BMI 26.52 kg/m  Gen: NAD, resting comfortably HEENT: Mucous membranes are moist. Oropharynx normal. Tm normal after removing cerumen from right ear, normal left ear canal and TM Neck: no thyromegaly CV: RRR no murmurs rubs or gallops Lungs: CTAB no crackles, wheeze, rhonchi Abdomen: soft/nontender/nondistended/normal bowel sounds. No rebound or guarding.  Ext: no edema Skin: warm, dry Neuro: grossly normal, moves all extremities, PERRLA    Assessment and Plan  54 y.o. male presenting for annual physical.  Health Maintenance counseling: 1. Anticipatory guidance: Patient counseled regarding regular dental exams q6 months, eye examsyearly,  avoiding smoking and second hand smoke, limiting alcohol to 2 beverages per day.   2. Risk factor reduction:  Advised patient of need for regular exercise and diet rich and fruits and vegetables to reduce risk of heart attack and stroke. Exercise- Patient mentions that he does some running and taekwondo and yoga- pretty much daily . Diet- reasonably healthy diet- could cut down on sweets.  Wt  Readings from Last 3 Encounters:  11/27/19 179 lb 9.6 oz (81.5 kg)  11/02/19 180 lb (81.6 kg)  08/14/19 180 lb 9.6 oz (81.9 kg)   3. Immunizations/screenings/ancillary studies- flu shot today. Hep c screening with labs.  Immunization History  Administered Date(s) Administered  . Influenza Split 01/27/2012, 12/13/2012, 12/13/2013  . Influenza Whole 12/08/2007  . Influenza,inj,Quad PF,6+ Mos 12/05/2014, 12/22/2017, 12/08/2018  . PFIZER SARS-COV-2 Vaccination 05/19/2019, 06/09/2019  . Pneumococcal Polysaccharide-23 06/25/2011  . Tdap 06/25/2011  4. Prostate cancer screening- no family history, start at age 72  Lab Results  Component Value Date   PSA 0.60 12/05/2007   5. Colon cancer screening - Due on 12/02/2021 due to family history. Normal 12/02/2021  6. Skin cancer screening- dermatology within the last 6 months and good report. advised regular sunscreen use. Denies worrisome, changing,  or new skin lesions.  7. Non smoker 8. STD screening - monogamous  Status of chronic or acute concerns   # Depression S: Medication:lexapro 10mg  has been more effective. Still working with therapist.   Depression screen [facility] 2/9 11/27/2019 08/09/2019 07/03/2019  Decreased Interest 0 0 0  Down, Depressed, Hopeless 0 1 1  PHQ - 2 Score 0 1 1  Altered sleeping 0 1 3  Tired, decreased energy 3 1 2   Change in appetite 0 0 0  Feeling bad or failure about yourself  0 0 0  Trouble concentrating 0 0 1  Moving slowly or fidgety/restless 0 0 0  Suicidal thoughts 0 0 0  PHQ-9 Score 3 3 7   Difficult doing work/chores Not difficult at all Not difficult at all Somewhat difficult  A/P: improved symptoms per patient- fatigue main lingering symptom but could be more from current state of world/work. Stressful environment and around a lot of unvaccinated students   # Asthma S: Maintenance Medication: symbicort available but does not use As needed medication: albuterol. Patient is using this just before exercise    A/P: reasonable control- continue current meds - can refill symbicor tif needed if symptoms worsen  # GERD S:Medication: dexilant 60mg  through dr. Hilarie Fredrickson working reasonably well  B12 levels related to PPI use: does not take b12 Lab Results  Component Value Date   VITAMINB12 303 07/03/2019  A/P: controlled but b12 has been low normal- we will update again today and if trending down likely start at least once a week   #Vitamin D deficiency S: Medication:  Has not been taking recently Last vitamin D Lab Results  Component Value Date   VD25OH 44.87 07/03/2019  A/P: low in the past and now off D we will update vitamin D with labs   #hyperlipidemia S: Medication:none. 10 year ascvd risk at 4.3 % using labs from April.   Lab Results  Component Value Date   CHOL 231 (H) 07/03/2019   HDL 47.80 07/03/2019   LDLCALC 148 (H) 07/03/2019   LDLDIRECT 159.3 06/25/2011   TRIG 177.0 (H) 07/03/2019   CHOLHDL 5 07/03/2019   A/P: under 7.5% risk would not recommend statin and will hold off on repeat until next year. Work on lifestyle changes/healthy habits  # allergies seasonal for about 2 weeks- Post nasal drip, runny nose, sinus pressure, eye itching, some wheeze, some fatigue (fatigue also from current state of the world)- very typical for this time of year. Would be past his quarantine even if covid positive. Using flonase and zyrtec for allergies and if bad may do allertec in AM.   # Knee pain- working with Dr. Tamala Julian- reasonable recently but cannot run as a result on the road. Can do ellipitical and treadmill  #snoring- patient never heard from pulmonary about referral- we will refer again today. Snoring slightly but ongoing fatigue/daytime sleepiness  #mild forgetfulness- forgetting word or placement of a word or trying to pull up a name. Not a ton but feels like more common. No family history of dementia. Perfect score on mini cog test.   Recommended follow up: No follow-ups on  file. Future Appointments  Date Time Provider [place]  12/17/2019 12:45 PM [name], DO [facility] None   Lab/Order associations:Non fasting   ICD-10-CM   1. Preventative health care  Z00.00 Hepatitis C antibody    CBC With Differential/Platelet    COMPLETE METABOLIC PANEL WITH GFR    Vitamin B12    VITAMIN  D 25 Hydroxy (Vit-D Deficiency, Fractures)  2. Gastroesophageal reflux disease without esophagitis  K21.9   3. B12 deficiency  E53.8   4. Hyperlipidemia, unspecified hyperlipidemia type  E78.5 CBC With Differential/Platelet    COMPLETE METABOLIC PANEL WITH GFR  5. Vitamin D deficiency  E55.9 VITAMIN D 25 Hydroxy (Vit-D Deficiency, Fractures)  6. Encounter for hepatitis C screening test for low risk patient  Z11.59 Hepatitis C antibody  7. High risk medication use  Z79.899 Vitamin B12  8. Snoring  R06.83 Ambulatory referral to Pulmonology   Return precautions advised.  Garret Reddish, MD

## 2019-11-26 NOTE — Patient Instructions (Addendum)
Please stop by lab before you go If you have mychart- we will send your results within 3 business days of [place] receiving them.  If you do not have mychart- we will call you about results within 5 business days of [place] receiving them.  *please note we are currently using [facility] which has a longer processing time than [facility] typically so labs may not come back as quickly as in the past *please also note that you will see labs on mychart as soon as they post. I will later go in and write notes on them- will say "notes from Dr. Yong Channel"  Health Maintenance Due  Topic Date Due  . INFLUENZA VACCINE In office flu shot today.  10/14/2019   Mineral oil for ear full of wax Purchase mineral oil from laxative aisle Lay down on your side with ear that is bothering you facing up Use 3-4 drops with a dropper and place in ear for 30 seconds Place cotton swab outside of ear Turn to other side and allow this to drain Repeat 3-4 x a day Return to see [place] if not improving within a few days  We will call you within two weeks about your referral to pulmonology about sleep apnea testing. If you do not hear within 3 weeks, give [place] a call.   I hope things get better on the work front for you

## 2019-11-27 ENCOUNTER — Other Ambulatory Visit: Payer: Self-pay

## 2019-11-27 ENCOUNTER — Encounter: Payer: Self-pay | Admitting: Family Medicine

## 2019-11-27 ENCOUNTER — Ambulatory Visit (INDEPENDENT_AMBULATORY_CARE_PROVIDER_SITE_OTHER): Payer: PRIVATE HEALTH INSURANCE | Admitting: Family Medicine

## 2019-11-27 VITALS — BP 108/70 | HR 67 | Temp 98.2°F | Resp 18 | Ht 69.0 in | Wt 179.6 lb

## 2019-11-27 DIAGNOSIS — Z23 Encounter for immunization: Secondary | ICD-10-CM | POA: Diagnosis not present

## 2019-11-27 DIAGNOSIS — E785 Hyperlipidemia, unspecified: Secondary | ICD-10-CM

## 2019-11-27 DIAGNOSIS — Z79899 Other long term (current) drug therapy: Secondary | ICD-10-CM

## 2019-11-27 DIAGNOSIS — E538 Deficiency of other specified B group vitamins: Secondary | ICD-10-CM

## 2019-11-27 DIAGNOSIS — K219 Gastro-esophageal reflux disease without esophagitis: Secondary | ICD-10-CM | POA: Diagnosis not present

## 2019-11-27 DIAGNOSIS — Z1159 Encounter for screening for other viral diseases: Secondary | ICD-10-CM

## 2019-11-27 DIAGNOSIS — Z0000 Encounter for general adult medical examination without abnormal findings: Secondary | ICD-10-CM

## 2019-11-27 DIAGNOSIS — E559 Vitamin D deficiency, unspecified: Secondary | ICD-10-CM

## 2019-11-27 DIAGNOSIS — R0683 Snoring: Secondary | ICD-10-CM

## 2019-11-27 MED ORDER — ESCITALOPRAM OXALATE 10 MG PO TABS
10.0000 mg | ORAL_TABLET | Freq: Every day | ORAL | 3 refills | Status: DC
Start: 2019-11-27 — End: 2020-03-17

## 2019-11-27 NOTE — Addendum Note (Signed)
Addended by: Thomes Cake on: 11/27/2019 02:31 PM   Modules accepted: Orders

## 2019-11-28 LAB — CBC WITH DIFFERENTIAL/PLATELET
Absolute Monocytes: 650 cells/uL (ref 200–950)
Basophils Absolute: 39 cells/uL (ref 0–200)
Basophils Relative: 0.4 %
Eosinophils Absolute: 87 cells/uL (ref 15–500)
Eosinophils Relative: 0.9 %
HCT: 43 % (ref 38.5–50.0)
Hemoglobin: 14.8 g/dL (ref 13.2–17.1)
Lymphs Abs: 1591 cells/uL (ref 850–3900)
MCH: 31.9 pg (ref 27.0–33.0)
MCHC: 34.4 g/dL (ref 32.0–36.0)
MCV: 92.7 fL (ref 80.0–100.0)
MPV: 11.8 fL (ref 7.5–12.5)
Monocytes Relative: 6.7 %
Neutro Abs: 7333 cells/uL (ref 1500–7800)
Neutrophils Relative %: 75.6 %
Platelets: 225 10*3/uL (ref 140–400)
RBC: 4.64 10*6/uL (ref 4.20–5.80)
RDW: 12.9 % (ref 11.0–15.0)
Total Lymphocyte: 16.4 %
WBC: 9.7 10*3/uL (ref 3.8–10.8)

## 2019-11-28 LAB — COMPLETE METABOLIC PANEL WITH GFR
AG Ratio: 1.7 (calc) (ref 1.0–2.5)
ALT: 16 U/L (ref 9–46)
AST: 20 U/L (ref 10–35)
Albumin: 4.6 g/dL (ref 3.6–5.1)
Alkaline phosphatase (APISO): 81 U/L (ref 35–144)
BUN: 22 mg/dL (ref 7–25)
CO2: 27 mmol/L (ref 20–32)
Calcium: 9.5 mg/dL (ref 8.6–10.3)
Chloride: 102 mmol/L (ref 98–110)
Creat: 1.21 mg/dL (ref 0.70–1.33)
GFR, Est African American: 79 mL/min/{1.73_m2} (ref >=60)
GFR, Est Non African American: 68 mL/min/{1.73_m2} (ref >=60)
Globulin: 2.7 g/dL (calc) (ref 1.9–3.7)
Glucose, Bld: 82 mg/dL (ref 65–99)
Potassium: 5 mmol/L (ref 3.5–5.3)
Sodium: 138 mmol/L (ref 135–146)
Total Bilirubin: 0.7 mg/dL (ref 0.2–1.2)
Total Protein: 7.3 g/dL (ref 6.1–8.1)

## 2019-11-28 LAB — VITAMIN B12: Vitamin B-12: 374 pg/mL (ref 200–1100)

## 2019-11-28 LAB — HEPATITIS C ANTIBODY
Hepatitis C Ab: NONREACTIVE
SIGNAL TO CUT-OFF: 0.02 (ref <1.00)

## 2019-11-28 LAB — VITAMIN D 25 HYDROXY (VIT D DEFICIENCY, FRACTURES): Vit D, 25-Hydroxy: 43 ng/mL (ref 30–100)

## 2019-11-29 ENCOUNTER — Encounter: Payer: Self-pay | Admitting: Family Medicine

## 2019-12-06 ENCOUNTER — Institutional Professional Consult (permissible substitution): Payer: PRIVATE HEALTH INSURANCE | Admitting: Internal Medicine

## 2019-12-17 ENCOUNTER — Ambulatory Visit (INDEPENDENT_AMBULATORY_CARE_PROVIDER_SITE_OTHER): Payer: PRIVATE HEALTH INSURANCE | Admitting: Family Medicine

## 2019-12-17 ENCOUNTER — Other Ambulatory Visit: Payer: Self-pay

## 2019-12-17 ENCOUNTER — Encounter: Payer: Self-pay | Admitting: Family Medicine

## 2019-12-17 VITALS — BP 100/80 | HR 65 | Ht 69.0 in | Wt 180.0 lb

## 2019-12-17 DIAGNOSIS — M6289 Other specified disorders of muscle: Secondary | ICD-10-CM

## 2019-12-17 DIAGNOSIS — M999 Biomechanical lesion, unspecified: Secondary | ICD-10-CM | POA: Diagnosis not present

## 2019-12-17 DIAGNOSIS — M5416 Radiculopathy, lumbar region: Secondary | ICD-10-CM

## 2019-12-17 NOTE — Assessment & Plan Note (Signed)
Continue tightness causing more of the pes anserine.  We discussed that we could do advanced imaging patient will continue with conservative therapy at this time.  Discussed stride length with running.  Follow-up again in 4 to 8 weeks

## 2019-12-17 NOTE — Patient Instructions (Addendum)
Good to see you Stretching of the TMJ Consider mouth guard DO sleep study Lessen stride length with running Stay active See me again in 6-8 weeks

## 2019-12-17 NOTE — Assessment & Plan Note (Signed)
Patient does not have any radicular symptoms at this time.  Still has some tightness.  Responds well to manipulation.  Discussed in the FABER test and stretching.  Follow-up again in 4 to 8 weeks

## 2019-12-17 NOTE — Progress Notes (Signed)
[place] [address] [place] Phone: [phone] Subjective:   I George [name] am serving as a Education administrator for Dr. Hulan Saas.  This visit occurred during the SARS-CoV-2 public health emergency.  Safety protocols were in place, including screening questions prior to the visit, additional usage of staff PPE, and extensive cleaning of exam room while observing appropriate contact time as indicated for disinfecting solutions.   I'm seeing this patient by the request  of:  Marin Olp, MD  CC: Back and neck pain follow-up  TIW:[id number]  George [name] is a 54 y.o. male coming in with complaint of back right knee and neck pain. Patient states he is sore today. Hasn't been running on the road. Running on the treadmill. About a week and a half ago had sharp pain in the knee. Stopped running and hasn't started back. Medial knee pain. Waking hills is also pain.    Medications patient has been prescribed: Gabapentin  Taking: Mostly         Reviewed prior external information including notes and imaging from previsou exam, outside providers and external EMR if available.   As well as notes that were available from care everywhere and other healthcare systems.  Past medical history, social, surgical and family history all reviewed in electronic medical record.  No pertanent information unless stated regarding to the chief complaint.   Past Medical History:  Diagnosis Date  . Anxiety   . Colon polyps   . GERD (gastroesophageal reflux disease)   . Seasonal allergies     No Known Allergies   Review of Systems:  No headache, visual changes, nausea, vomiting, diarrhea, constipation, dizziness, abdominal pain, skin rash, fevers, chills, night sweats, weight loss, swollen lymph nodes, body aches, joint swelling, chest pain, shortness of breath, mood changes. POSITIVE muscle aches  Objective  Blood pressure 100/80, pulse 65, height 5'  9" (1.753 m), weight 180 lb (81.6 kg), SpO2 97 %.   General: No apparent distress alert and oriented x3 mood and affect normal, dressed appropriately.  HEENT: Pupils equal, extraocular movements intact  Respiratory: Patient's speak in full sentences and does not appear short of breath  Cardiovascular: No lower extremity edema, non tender, no erythema  Neuro: Cranial nerves II through XII are intact, neurovascularly intact in all extremities with 2+ DTRs and 2+ pulses.  Gait normal with good balance and coordination.  MSK:   Right knee exam once again shows good stability noted.  Pain more over the pes anserine area.  Patient does not have much medial joint line pain.  Mild tightness of the hamstrings bilaterally right greater than left. Back -low back exam shows the patient does have some mild loss of lordosis, some tenderness to palpation in the paraspinal musculature of the lumbar spine regarding the left,   Osteopathic findings   C7 flexed rotated and side bent left T3 extended rotated and side bent right inhaled rib T8 extended rotated and side bent left L2 flexed rotated and side bent right Sacrum right on right       Assessment and Plan:  Lumbar radiculopathy Patient does not have any radicular symptoms at this time.  Still has some tightness.  Responds well to manipulation.  Discussed in the FABER test and stretching.  Follow-up again in 4 to 8 weeks  Hamstring tightness of right lower extremity Continue tightness causing more of the pes anserine.  We discussed that we could do advanced imaging patient will  continue with conservative therapy at this time.  Discussed stride length with running.  Follow-up again in 4 to 8 weeks    Nonallopathic problems  Decision today to treat with OMT was based on Physical Exam  After verbal consent patient was treated with HVLA, ME, FPR techniques in cervical, rib, thoracic, lumbar, and sacral  areas  Patient tolerated the procedure  well with improvement in symptoms  Patient given exercises, stretches and lifestyle modifications  See medications in patient instructions if given  Patient will follow up in 4-8 weeks      The above documentation has been reviewed and is accurate and complete [name], DO       Note: This dictation was prepared with Dragon dictation along with smaller phrase technology. Any transcriptional errors that result from this process are unintentional.

## 2019-12-25 ENCOUNTER — Encounter: Payer: Self-pay | Admitting: Pulmonary Disease

## 2019-12-25 ENCOUNTER — Ambulatory Visit: Payer: PRIVATE HEALTH INSURANCE | Admitting: Pulmonary Disease

## 2019-12-25 ENCOUNTER — Other Ambulatory Visit: Payer: Self-pay

## 2019-12-25 VITALS — BP 120/78 | HR 83 | Temp 97.4°F | Ht 68.0 in | Wt 181.2 lb

## 2019-12-25 DIAGNOSIS — G4733 Obstructive sleep apnea (adult) (pediatric): Secondary | ICD-10-CM | POA: Diagnosis not present

## 2019-12-25 NOTE — Progress Notes (Signed)
George [name]    [id number]    03-Feb-1966  Primary Care Physician:Hunter, Brayton Mars, MD  Referring Physician: Marin Olp, [place],  [place] [address]  Chief complaint:   Patient being seen for snoring  HPI:  Has been having some issues with tightness in his jaw and headache in the morning Has not been told by his dentist that is grinding at night, does not feel like he is grinding his teeth  Snoring for many years No history of witnessed apneas No significant dryness of his throat in the mornings He does have morning headaches That has obstructive sleep apnea  He does have a history of allergies, has been having some sinus issues, some phlegm production-clear phlegm  Usually goes to bed between 11 and 12 PM Takes him only a few minutes to fall asleep Usually 1-2 awakenings final wake up time between 7 and 8 AM   Weight has been stable recently  Never smoker  History of exercise-induced asthma Has been using his inhaler a little bit more since his allergies have kicked up a little bit  Outpatient Encounter Medications as of 12/25/2019  Medication Sig  . albuterol (VENTOLIN HFA) 108 (90 Base) MCG/ACT inhaler Inhale 2 puffs into the lungs every 6 (six) hours as needed for wheezing or shortness of breath.  . budesonide-formoterol (SYMBICORT) 160-4.5 MCG/ACT inhaler Inhale 2 puffs into the lungs 2 (two) times daily. (Patient taking differently: Inhale 2 puffs into the lungs 2 (two) times daily. As needed)  . cetirizine (ZYRTEC) 10 MG tablet Take 10 mg by mouth at bedtime.  Marland Kitchen dexlansoprazole (DEXILANT) 60 MG capsule Take 1 capsule (60 mg total) by mouth daily.  Marland Kitchen escitalopram (LEXAPRO) 10 MG tablet Take 1 tablet (10 mg total) by mouth daily.  . fluticasone (FLONASE) 50 MCG/ACT nasal spray Place 2 sprays into both nostrils daily.  Marland Kitchen gabapentin (NEURONTIN) 100 MG capsule TAKE 2 CAPSULES (200 MG TOTAL) BY MOUTH AT BEDTIME.  . Melatonin 5 MG  TABS Take by mouth.   No facility-administered encounter medications on file as of 12/25/2019.    Allergies as of 12/25/2019  . (No Known Allergies)    Past Medical History:  Diagnosis Date  . Anxiety   . Colon polyps   . GERD (gastroesophageal reflux disease)   . Seasonal allergies     Past Surgical History:  Procedure Laterality Date  . COLONOSCOPY    . UPPER GASTROINTESTINAL ENDOSCOPY      Family History  Problem Relation Age of Onset  . Obesity Father   . Asthma Father   . Hypertension Father   . Bladder Cancer Father   . CAD Father        prior smoker. cabg in late 70s  . Colon polyps Mother   . Healthy Sister   . Bipolar disorder Brother   . Colon cancer Maternal Grandmother   . Diabetes Neg Hx   . Early death Neg Hx   . Heart disease Neg Hx   . Hyperlipidemia Neg Hx   . Kidney disease Neg Hx   . Stroke Neg Hx   . Esophageal cancer Neg Hx   . Stomach cancer Neg Hx     Social History   Socioeconomic History  . Marital status: Married    Spouse name: Not on file  . Number of children: 2  . Years of education: Not on file  . Highest education level: Not  on file  Occupational History  . 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  Tobacco Use  . Smoking status: Never Smoker  . Smokeless tobacco: Never Used  Vaping Use  . Vaping Use: Never used  Substance and Sexual Activity  . Alcohol use: Yes    Comment: occasional   . Drug use: No  . Sexual activity: Yes    Comment: wife   Other Topics Concern  . Not on file  Social History Narrative   Married. 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       H[place]bes: tae kwon do, exercise, movies, hiking   Social Determinants of Health   Financial Resource Strain:   . Difficulty of Paying Living Expenses: Not on file  Food Insecurity:   . Worried About Charity fundraiser in the Last Year: Not on file  . Ran Out of Food in the Last  Year: Not on file  Transportation Needs:   . Lack of Transportation (Medical): Not on file  . Lack of Transportation (Non-Medical): Not on file  Physical Activity:   . Days of Exercise per Week: Not on file  . Minutes of Exercise per Session: Not on file  Stress:   . Feeling of Stress : Not on file  Social Connections:   . Frequency of Communication with Friends and Family: Not on file  . Frequency of Social Gatherings with Friends and Family: Not on file  . Attends Religious Services: Not on file  . Active Member of Clubs or Organizations: Not on file  . Attends Archivist Meetings: Not on file  . Marital Status: Not on file  Intimate Partner Violence:   . Fear of Current or Ex-Partner: Not on file  . Emotionally Abused: Not on file  . Physically Abused: Not on file  . Sexually Abused: Not on file    Review of Systems  Constitutional: Negative.  Negative for fatigue and fever.  Respiratory: Negative for apnea.   Psychiatric/Behavioral: Positive for sleep disturbance.    Vitals:   12/25/19 1419  BP: 120/78  Pulse: 83  Temp: (!) 97.4 F (36.3 C)  SpO2: 97%     Physical Exam Constitutional:      Appearance: Normal appearance.  HENT:     Head: Normocephalic.     Nose: Nose normal.     Mouth/Throat:     Mouth: Mucous membranes are moist.     Comments: Mallampati 3, crowded oropharynx, neck size over 17 Eyes:     General:        Right eye: No discharge.        Left eye: No discharge.     Pupils: Pupils are equal, round, and reactive to light.  Cardiovascular:     Rate and Rhythm: Normal rate and regular rhythm.     Heart sounds: No murmur heard.  No friction rub.  Pulmonary:     Effort: No respiratory distress.     Breath sounds: No stridor. No wheezing or rhonchi.  Musculoskeletal:     Cervical back: No rigidity or tenderness.  Neurological:     Mental Status: He is alert.  Psychiatric:        Mood and Affect: Mood normal.     Results of the  Epworth flowsheet 12/25/2019  Sitting and reading 1  Watching TV 0  Sitting, inactive in a  public place (e.g. a theatre or a meeting) 1  As a passenger in a car for an hour without a break 1  Lying down to rest in the afternoon when circumstances permit 1  Sitting and talking to someone 0  Sitting quietly after a lunch without alcohol 0  In a car, while stopped for a few minutes in traffic 0  Total score 4   Assessment:  Snoring  Moderate probability of significant obstructive sleep apnea  Pathophysiology of sleep disordered breathing discussed Treatment options discussed  Plan/Recommendations: Schedule patient for home sleep study  Treatment options as discussed  Follow-up in 3 months   Sherrilyn Rist MD [facility] 12/25/2019, 2:37 PM  CC: Marin Olp, MD

## 2019-12-25 NOTE — Patient Instructions (Signed)
History of snoring  Mild to moderate probability of significant obstructive sleep apnea  We will schedule you for home sleep study Update you with results as soon as reviewed  Treatment options for sleep disordered breathing is an oral device or CPAP  Follow-up in 3 months Sleep Apnea Sleep apnea is a condition in which breathing pauses or becomes shallow during sleep. Episodes of sleep apnea usually last 10 seconds or longer, and they may occur as many as 20 times an hour. Sleep apnea disrupts your sleep and keeps your body from getting the rest that it needs. This condition can increase your risk of certain health problems, including:  Heart attack.  Stroke.  Obesity.  Diabetes.  Heart failure.  Irregular heartbeat. What are the causes? There are three kinds of sleep apnea:  Obstructive sleep apnea. This kind is caused by a blocked or collapsed airway.  Central sleep apnea. This kind happens when the part of the brain that controls breathing does not send the correct signals to the muscles that control breathing.  Mixed sleep apnea. This is a combination of obstructive and central sleep apnea. The most common cause of this condition is a collapsed or blocked airway. An airway can collapse or become blocked if:  Your throat muscles are abnormally relaxed.  Your tongue and tonsils are larger than normal.  You are overweight.  Your airway is smaller than normal. What increases the risk? You are more likely to develop this condition if you:  Are overweight.  Smoke.  Have a smaller than normal airway.  Are elderly.  Are male.  Drink alcohol.  Take sedatives or tranquilizers.  Have a family history of sleep apnea. What are the signs or symptoms? Symptoms of this condition include:  Trouble staying asleep.  Daytime sleepiness and tiredness.  Irritability.  Loud snoring.  Morning headaches.  Trouble concentrating.  Forgetfulness.  Decreased  interest in sex.  Unexplained sleepiness.  Mood swings.  Personality changes.  Feelings of depression.  Waking up often during the night to urinate.  Dry mouth.  Sore throat. How is this diagnosed? This condition may be diagnosed with:  A medical history.  A physical exam.  A series of tests that are done while you are sleeping (sleep study). These tests are usually done in a sleep lab, but they may also be done at home. How is this treated? Treatment for this condition aims to restore normal breathing and to ease symptoms during sleep. It may involve managing health issues that can affect breathing, such as high blood pressure or obesity. Treatment may include:  Sleeping on your side.  Using a decongestant if you have nasal congestion.  Avoiding the use of depressants, including alcohol, sedatives, and narcotics.  Losing weight if you are overweight.  Making changes to your diet.  Quitting smoking.  Using a device to open your airway while you sleep, such as: ? An oral appliance. This is a custom-made mouthpiece that shifts your lower jaw forward. ? A continuous positive airway pressure (CPAP) device. This device blows air through a mask when you breathe out (exhale). ? A nasal expiratory positive airway pressure (EPAP) device. This device has valves that you put into each nostril. ? A bi-level positive airway pressure (BPAP) device. This device blows air through a mask when you breathe in (inhale) and breathe out (exhale).  Having surgery if other treatments do not work. During surgery, excess tissue is removed to create a wider airway. It is important  to get treatment for sleep apnea. Without treatment, this condition can lead to:  High blood pressure.  Coronary artery disease.  In men, an inability to achieve or maintain an erection (impotence).  Reduced thinking abilities. Follow these instructions at home: Lifestyle  Make any lifestyle changes that your  health care provider recommends.  Eat a healthy, well-balanced diet.  Take steps to lose weight if you are overweight.  Avoid using depressants, including alcohol, sedatives, and narcotics.  Do not use any products that contain nicotine or tobacco, such as cigarettes, e-cigarettes, and chewing tobacco. If you need help quitting, ask your health care provider. General instructions  Take over-the-counter and prescription medicines only as told by your health care provider.  If you were given a device to open your airway while you sleep, use it only as told by your health care provider.  If you are having surgery, make sure to tell your health care provider you have sleep apnea. You may need to bring your device with you.  Keep all follow-up visits as told by your health care provider. This is important. Contact a health care provider if:  The device that you received to open your airway during sleep is uncomfortable or does not seem to be working.  Your symptoms do not improve.  Your symptoms get worse. Get help right away if:  You develop: ? Chest pain. ? Shortness of breath. ? Discomfort in your back, arms, or stomach.  You have: ? Trouble speaking. ? Weakness on one side of your body. ? Drooping in your face. These symptoms may represent a serious problem that is an emergency. Do not wait to see if the symptoms will go away. Get medical help right away. Call your local emergency services (911 in the [place] Do not drive yourself to the hospital. Summary  Sleep apnea is a condition in which breathing pauses or becomes shallow during sleep.  The most common cause is a collapsed or blocked airway.  The goal of treatment is to restore normal breathing and to ease symptoms during sleep. This information is not intended to replace advice given to you by your health care provider. Make sure you discuss any questions you have with your health care provider. Document Revised:  08/16/2018 Document Reviewed: 10/25/2017 Elsevier Patient Education  [place].

## 2020-01-29 NOTE — Progress Notes (Signed)
[facility] [address] [place] Phone: [phone] Subjective:   I George [name] am serving as a Education administrator for Dr. Hulan Saas.  This visit occurred during the SARS-CoV-2 public health emergency.  Safety protocols were in place, including screening questions prior to the visit, additional usage of staff PPE, and extensive cleaning of exam room while observing appropriate contact time as indicated for disinfecting solutions.   I'm seeing this patient by the request  of:  Marin Olp, MD  CC: Low back pain follow-up  [id number]  George [name] is a 54 y.o. male coming in with complaint of back and neck pain. OMT 12/17/2019. Patient states he is doing well. Having a mid back issue still.   Medications patient has been prescribed: Gabapentin  Taking:yes          Reviewed prior external information including notes and imaging from previsou exam, outside providers and external EMR if available.   As well as notes that were available from care everywhere and other healthcare systems.  Past medical history, social, surgical and family history all reviewed in electronic medical record.  No pertanent information unless stated regarding to the chief complaint.   Past Medical History:  Diagnosis Date  . Anxiety   . Colon polyps   . GERD (gastroesophageal reflux disease)   . Seasonal allergies     No Known Allergies no known drug allergies   Review of Systems:  No headache, visual changes, nausea, vomiting, diarrhea, constipation, dizziness, abdominal pain, skin rash, fevers, chills, night sweats, weight loss, swollen lymph nodes, body aches, joint swelling, chest pain, shortness of breath, mood changes. POSITIVE muscle aches  Objective  Blood pressure 110/78, pulse 64, height 5\' 8"  (1.727 m), weight 179 lb (81.2 kg), SpO2 97 %.   General: No apparent distress alert and oriented x3 mood and affect normal, dressed appropriately.   HEENT: Pupils equal, extraocular movements intact  Respiratory: Patient's speak in full sentences and does not appear short of breath  Cardiovascular: No lower extremity edema, non tender, no erythema  Neuro: Cranial nerves II through XII are intact, neurovascularly intact in all extremities with 2+ DTRs and 2+ pulses.  Gait normal with good balance and coordination.  MSK:  Non tender with full range of motion and good stability and symmetric strength and tone of shoulders, elbows, wrist, hip, knee and ankles bilaterally.  Back -low exam shows the patient does have some mild loss of lordosis.  Tightness noted with Corky Sox.  Negative straight leg test.  Tightness noted in the paraspinal musculature of the cervical spine as well.  Very mild crepitus.  Negative Spurling's.  Osteopathic findings  C2 flexed rotated and side bent right T4 extended rotated and side bent left inhaled rib T8 extended rotated and side bent left L2 flexed rotated and side bent right Sacrum right on right       Assessment and Plan:  Lumbar radiculopathy Chronic problem.  Mild exacerbation. Tightness of the hip flexor. Has done fairly well with conservative therapy over the course of time.  On the low-dose gabapentin.  Continue the home exercises, core stability.  Follow-up again in 4 to 8 weeks    Nonallopathic problems  Decision today to treat with OMT was based on Physical Exam  After verbal consent patient was treated with HVLA, ME, FPR techniques in cervical, rib, thoracic, lumbar, and sacral  areas  Patient tolerated the procedure well with improvement in symptoms  Patient given  exercises, stretches and lifestyle modifications  See medications in patient instructions if given  Patient will follow up in 4-8 weeks      The above documentation has been reviewed and is accurate and complete George Pulley, DO       Note: This dictation was prepared with Dragon dictation along with smaller phrase  technology. Any transcriptional errors that result from this process are unintentional.

## 2020-01-30 ENCOUNTER — Other Ambulatory Visit: Payer: Self-pay

## 2020-01-30 ENCOUNTER — Encounter: Payer: Self-pay | Admitting: Family Medicine

## 2020-01-30 ENCOUNTER — Ambulatory Visit: Payer: PRIVATE HEALTH INSURANCE | Admitting: Family Medicine

## 2020-01-30 VITALS — BP 110/78 | HR 64 | Ht 68.0 in | Wt 179.0 lb

## 2020-01-30 DIAGNOSIS — M5416 Radiculopathy, lumbar region: Secondary | ICD-10-CM

## 2020-01-30 DIAGNOSIS — M999 Biomechanical lesion, unspecified: Secondary | ICD-10-CM | POA: Diagnosis not present

## 2020-01-30 NOTE — Patient Instructions (Addendum)
Good to see you Happy holidays! See me again in 6 weeks

## 2020-01-30 NOTE — Assessment & Plan Note (Addendum)
Chronic problem.  Mild exacerbation. Tightness of the hip flexor. Has done fairly well with conservative therapy over the course of time.  On the low-dose gabapentin.  Continue the home exercises, core stability.  Follow-up again in 4 to 8 weeks

## 2020-02-13 ENCOUNTER — Encounter: Payer: Self-pay | Admitting: Family Medicine

## 2020-03-17 ENCOUNTER — Telehealth (INDEPENDENT_AMBULATORY_CARE_PROVIDER_SITE_OTHER): Payer: PRIVATE HEALTH INSURANCE | Admitting: Family Medicine

## 2020-03-17 ENCOUNTER — Encounter: Payer: Self-pay | Admitting: Family Medicine

## 2020-03-17 VITALS — Ht 68.0 in | Wt 179.0 lb

## 2020-03-17 DIAGNOSIS — F411 Generalized anxiety disorder: Secondary | ICD-10-CM | POA: Diagnosis not present

## 2020-03-17 DIAGNOSIS — J4541 Moderate persistent asthma with (acute) exacerbation: Secondary | ICD-10-CM | POA: Diagnosis not present

## 2020-03-17 DIAGNOSIS — F325 Major depressive disorder, single episode, in full remission: Secondary | ICD-10-CM

## 2020-03-17 MED ORDER — BUDESONIDE-FORMOTEROL FUMARATE 160-4.5 MCG/ACT IN AERO: 2.0000 | INHALATION_SPRAY | Freq: Two times a day (BID) | RESPIRATORY_TRACT | 3 refills | Status: DC

## 2020-03-17 MED ORDER — ESCITALOPRAM OXALATE 10 MG PO TABS
15.0000 mg | ORAL_TABLET | Freq: Every day | ORAL | 3 refills | Status: DC
Start: 2020-03-17 — End: 2020-05-27

## 2020-03-17 MED ORDER — ALBUTEROL SULFATE HFA 108 (90 BASE) MCG/ACT IN AERS: 2.0000 | INHALATION_SPRAY | Freq: Four times a day (QID) | RESPIRATORY_TRACT | 2 refills | Status: DC | PRN

## 2020-03-17 NOTE — Progress Notes (Signed)
Phone [phone] Virtual visit via Video note   Subjective:  Chief complaint: Chief Complaint  Patient presents with  . Cough  . Medication Management   This visit type was conducted due to national recommendations for restrictions regarding the COVID-19 Pandemic (e.g. social distancing).  This format is felt to be most appropriate for this patient at this time balancing risks to patient and risks to population by having him in for in person visit.  No physical exam was performed (except for noted visual exam or audio findings with Telehealth visits).    Our team/I connected with Modena Nunnery at  1:20 PM EST by a video enabled telemedicine application (doxy.me or caregility through epic) and verified that I am speaking with the correct person using two identifiers.  Location patient: [facility] Location provider: [facility], office Persons participating in the virtual visit:  patient  Our team/I discussed the limitations of evaluation and management by telemedicine and the availability of in person appointments. In light of current covid-19 pandemic, patient also understands that we are trying to protect them by minimizing in office contact if at all possible.  The patient expressed consent for telemedicine visit and agreed to proceed. Patient understands insurance will be billed.   Past Medical History-  Patient Active Problem List   Diagnosis Date Noted  . GAD (generalized anxiety disorder) 03/17/2020    Priority: Medium  . Major depressive disorder with single episode, in full remission (HCC) 03/17/2020    Priority: Medium  . Hyperlipidemia, unspecified 07/03/2019    Priority: Medium  . Vitamin D deficiency 03/21/2018    Priority: Medium  . B12 deficiency 03/21/2018    Priority: Medium  . Allergic rhinitis due to pollen 04/23/2015    Priority: Medium  . Extrinsic asthma 08/11/2010    Priority: Medium  . Insomnia 08/11/2010    Priority: Medium  . GERD 12/08/2007    Priority:  Medium  . Irritable bowel syndrome 12/08/2007    Priority: Medium  . Callus of foot 05/17/2019    Priority: Low  . Poor posture 12/22/2017    Priority: Low  . Capsulitis of metatarsophalangeal (MTP) joint of left foot 12/22/2017    Priority: Low  . Nonallopathic lesion of cervical region 10/28/2017    Priority: Low  . Nonallopathic lesion of rib cage 10/28/2017    Priority: Low  . Tibialis posterior tendinitis, right 08/30/2017    Priority: Low  . Extensor intersection syndrome of right wrist 02/24/2017    Priority: Low  . ECU (extensor carpi ulnaris), subluxation/dislocation, right, initial encounter 07/19/2016    Priority: Low  . Lumbar radiculopathy 08/14/2015    Priority: Low  . Nonallopathic lesion of thoracic region 05/19/2015    Priority: Low  . Nonallopathic lesion of lumbosacral region 05/19/2015    Priority: Low  . Nonallopathic lesion of sacral region 05/19/2015    Priority: Low  . Piriformis syndrome of right side 04/24/2015    Priority: Low  . Hamstring tightness of right lower extremity 11/02/2019    Medications- reviewed and updated Current Outpatient Medications  Medication Sig Dispense Refill  . cetirizine (ZYRTEC) 10 MG tablet Take 10 mg by mouth at bedtime.    Marland Kitchen dexlansoprazole (DEXILANT) 60 MG capsule Take 1 capsule (60 mg total) by mouth daily. 90 capsule 3  . fluticasone (FLONASE) 50 MCG/ACT nasal spray Place 2 sprays into both nostrils daily. 16 g 11  . gabapentin (NEURONTIN) 100 MG capsule TAKE 2 CAPSULES (200 MG TOTAL) BY MOUTH AT  BEDTIME. 180 capsule 1  . Melatonin 5 MG TABS Take by mouth.    Marland Kitchen albuterol (VENTOLIN HFA) 108 (90 Base) MCG/ACT inhaler Inhale 2 puffs into the lungs every 6 (six) hours as needed for wheezing or shortness of breath. 18 g 2  . budesonide-formoterol (SYMBICORT) 160-4.5 MCG/ACT inhaler Inhale 2 puffs into the lungs 2 (two) times daily. 3 each 3  . escitalopram (LEXAPRO) 10 MG tablet Take 1.5 tablets (15 mg total) by mouth  daily. 135 tablet 3   No current facility-administered medications for this visit.     Objective:  Ht 5\' 8"  (1.727 m)   Wt 179 lb 0.2 oz (81.2 kg)   BMI 27.22 kg/m  self reported vitals Gen: NAD, resting comfortably, no cough during visit Lungs: nonlabored, normal respiratory rate  Skin: appears dry, no obvious rash     Assessment and Plan   # Cough/wheeze- poorly controlled asthma? S: dry intermittent cough on and off with some wheezing a few days a week.  Had been in [place] and wondered if that triggered him. Not waking him up from sleep. Similar symptoms with GERD in the past. But compliant with dexilant - cough worse off of this. Still on zyrtec and flonase for allergies.  -off symbicort and out of albuterol A/P:  Intermittent cough and wheeze for months. Bad allergy season could have triggered asthma. We are going to try albuterol prn and log how frequently using- if more than twice a week over 2-3 week period will restart symbicort. Chronic intermittent issue- I do not think covid testing required at this point.    # Depression/ possible GAD S: Medication: lexapro 10mg    Depression controlled. Has been working with therapist and they have discussed potentially increasing lexapro to 20mg . Bad stretch at work and then better with thanksgiving  Wakes up half an hour or hour after falling asleep then trouble falling asleepin Depression screen [facility] 2/9 03/17/2020 03/17/2020 11/27/2019  Decreased Interest 0 0 0  Down, Depressed, Hopeless 1 0 0  PHQ - 2 Score 1 0 0  Altered sleeping 1 - 0  Tired, decreased energy 0 - 3  Change in appetite 1 - 0  Feeling bad or failure about yourself  0 - 0  Trouble concentrating 0 - 0  Moving slowly or fidgety/restless 0 - 0  Suicidal thoughts 0 - 0  PHQ-9 Score 3 - 3  Difficult doing work/chores Not difficult at all - Not difficult at all   GAD 7 : Generalized Anxiety Score 03/17/2020  Nervous, Anxious, on Edge 2  Control/stop worrying 2  Worry too  much - different things 2  Trouble relaxing 3  Restless 0  Easily annoyed or irritable 2  Afraid - awful might happen 1  Total GAD 7 Score 12  Anxiety Difficulty Somewhat difficult   A/P: PHQ9 well controlled. GAD7 above goal- prefer under 5 but want at least under 10- anxiety seems to be bigger element at present- we will trial lexapro 15mg  for several weeks- if does not feel like doing well by 4 weeks can increase to 20 mg (II am fine to increase this without a visit).   Recommended follow up: keep September vsiit Future Appointments  Date Time Provider [place]  03/20/2020 10:00 AM [name], DO [facility] None  11/28/2020  8:20 AM Yong Channel, Brayton Mars, MD [facility]    Lab/Order associations:   ICD-10-CM   1. GAD (generalized anxiety disorder)  F41.1   2. Major  depressive disorder with single episode, in full remission ([place])  F32.5   3. Moderate persistent extrinsic asthma with acute exacerbation  J45.41 albuterol (VENTOLIN HFA) 108 (90 Base) MCG/ACT inhaler    budesonide-formoterol (SYMBICORT) 160-4.5 MCG/ACT inhaler   Asthma exacerbation, worse at night and in am, improved with running. DDx allergic rhinitis, GERD, viral. Tx albuterol first.     Meds ordered this encounter  Medications  . escitalopram (LEXAPRO) 10 MG tablet    Sig: Take 1.5 tablets (15 mg total) by mouth daily.    Dispense:  135 tablet    Refill:  3    Do not fill unless patient requests.  Marland Kitchen albuterol (VENTOLIN HFA) 108 (90 Base) MCG/ACT inhaler    Sig: Inhale 2 puffs into the lungs every 6 (six) hours as needed for wheezing or shortness of breath.    Dispense:  18 g    Refill:  2  . budesonide-formoterol (SYMBICORT) 160-4.5 MCG/ACT inhaler    Sig: Inhale 2 puffs into the lungs 2 (two) times daily.    Dispense:  3 each    Refill:  3    Do not fill unless patient requests    Return precautions advised.  Garret Reddish, MD

## 2020-03-19 ENCOUNTER — Ambulatory Visit: Payer: PRIVATE HEALTH INSURANCE | Admitting: Family Medicine

## 2020-03-20 ENCOUNTER — Encounter: Payer: Self-pay | Admitting: Family Medicine

## 2020-03-20 ENCOUNTER — Ambulatory Visit: Payer: PRIVATE HEALTH INSURANCE | Admitting: Family Medicine

## 2020-03-20 ENCOUNTER — Other Ambulatory Visit: Payer: Self-pay

## 2020-03-20 VITALS — BP 134/90 | HR 64 | Ht 68.0 in | Wt 182.0 lb

## 2020-03-20 DIAGNOSIS — M5416 Radiculopathy, lumbar region: Secondary | ICD-10-CM | POA: Diagnosis not present

## 2020-03-20 DIAGNOSIS — M999 Biomechanical lesion, unspecified: Secondary | ICD-10-CM | POA: Diagnosis not present

## 2020-03-20 NOTE — Assessment & Plan Note (Signed)
Patient is not having radiculopathy but is having more tightness at the thoracolumbar juncture.  Chronic problem with mild exacerbation.  Does have the gabapentin.  Has not been doing the exercises regularly.  We discussed posture and ergonomics, which activities to do which wants to avoid.  Patient should increase activity slowly.  Follow-up again 5 to 6 weeks

## 2020-03-20 NOTE — Patient Instructions (Addendum)
Good to see you Get back into routine Thanks for book recommendation See me again in 5-6 weeks

## 2020-03-20 NOTE — Progress Notes (Signed)
[place] [address] [place] Phone: [phone] Subjective:   I George [name] am serving as a Education administrator for Dr. Hulan Saas.  This visit occurred during the SARS-CoV-2 public health emergency.  Safety protocols were in place, including screening questions prior to the visit, additional usage of staff PPE, and extensive cleaning of exam room while observing appropriate contact time as indicated for disinfecting solutions.   I'm seeing this patient by the request  of:  Marin Olp, MD  CC: Back and neck pain follow-up  [id number]  Kena Crist is a 55 y.o. male coming in with complaint of back and neck pain. OMT 01/30/2020. Patient states that everything is sore today.  Patient did travel for the holidays.  Patient was out of his routine.  More tightness in the back.  Not working out as much.  Has gained weight and feels like that is contributing.  Denies any radiation down the legs any numbness.  Medications patient has been prescribed:   Taking:         Reviewed prior external information including notes and imaging from previsou exam, outside providers and external EMR if available.   As well as notes that were available from care everywhere and other healthcare systems.  Past medical history, social, surgical and family history all reviewed in electronic medical record.  No pertanent information unless stated regarding to the chief complaint.   Past Medical History:  Diagnosis Date  . Anxiety   . Colon polyps   . GERD (gastroesophageal reflux disease)   . Seasonal allergies     No Known Allergies   Review of Systems:  No headache, visual changes, nausea, vomiting, diarrhea, constipation, dizziness, abdominal pain, skin rash, fevers, chills, night sweats, weight loss, swollen lymph nodes, body aches, joint swelling, chest pain, shortness of breath, mood changes. POSITIVE muscle aches  Objective  Blood pressure  134/90, pulse 64, height 5\' 8"  (1.727 m), weight 182 lb (82.6 kg), SpO2 99 %.   General: No apparent distress alert and oriented x3 mood and affect normal, dressed appropriately.  HEENT: Pupils equal, extraocular movements intact  Respiratory: Patient's speak in full sentences and does not appear short of breath  Cardiovascular: No lower extremity edema, non tender, no erythema  Neuro: Cranial nerves II through XII are intact, neurovascularly intact in all extremities with 2+ DTRs and 2+ pulses.  Gait normal with good balance and coordination.  MSK:  Non tender with full range of motion and good stability and symmetric strength and tone of shoulders, elbows, wrist, hip, knee and ankles bilaterally.  Back -patient back exam does show increasing tightness noted in the thoracolumbar juncture right greater than left.  Seems to have more tenderness at L1-L2 on the right side. Neck exam does have some mild loss of lordosis.  Some tenderness to palpation in the paraspinal musculature.  More pain on the right side of the parascapular region on the left.  Osteopathic findings  C6 flexed rotated and side bent left T3 extended rotated and side bent right inhaled rib T11 extended rotated and side bent right L1 flexed rotated and side bent right Sacrum right on right       Assessment and Plan:  Lumbar radiculopathy Patient is not having radiculopathy but is having more tightness at the thoracolumbar juncture.  Chronic problem with mild exacerbation.  Does have the gabapentin.  Has not been doing the exercises regularly.  We discussed posture and ergonomics, which  activities to do which wants to avoid.  Patient should increase activity slowly.  Follow-up again 5 to 6 weeks    Nonallopathic problems  Decision today to treat with OMT was based on Physical Exam  After verbal consent patient was treated with HVLA, ME, FPR techniques in cervical, rib, thoracic, lumbar, and sacral  areas  Patient  tolerated the procedure well with improvement in symptoms  Patient given exercises, stretches and lifestyle modifications  See medications in patient instructions if given  Patient will follow up in 4-8 weeks      The above documentation has been reviewed and is accurate and complete Judi Saa, DO       Note: This dictation was prepared with Dragon dictation along with smaller phrase technology. Any transcriptional errors that result from this process are unintentional.

## 2020-04-04 ENCOUNTER — Encounter: Payer: Self-pay | Admitting: Pulmonary Disease

## 2020-04-07 ENCOUNTER — Telehealth: Payer: Self-pay | Admitting: Pulmonary Disease

## 2020-04-08 NOTE — Telephone Encounter (Signed)
Pt is scheduled and aware of appt 

## 2020-04-08 NOTE — Telephone Encounter (Signed)
lmam for pt to call back to schedule 

## 2020-04-10 ENCOUNTER — Ambulatory Visit: Payer: PRIVATE HEALTH INSURANCE

## 2020-04-10 ENCOUNTER — Other Ambulatory Visit: Payer: Self-pay

## 2020-04-10 DIAGNOSIS — R0683 Snoring: Secondary | ICD-10-CM

## 2020-04-10 DIAGNOSIS — G4733 Obstructive sleep apnea (adult) (pediatric): Secondary | ICD-10-CM

## 2020-04-14 DIAGNOSIS — R0683 Snoring: Secondary | ICD-10-CM | POA: Diagnosis not present

## 2020-04-15 ENCOUNTER — Telehealth: Payer: Self-pay | Admitting: Pulmonary Disease

## 2020-04-15 DIAGNOSIS — G4733 Obstructive sleep apnea (adult) (pediatric): Secondary | ICD-10-CM

## 2020-04-15 NOTE — Telephone Encounter (Signed)
Called and spoke with patient about sleep study results. Advised him of the options for sleep apnea and he would like to see if he is a candidate for the oral device. Referral has been placed. Nothing further needed at this time.

## 2020-04-15 NOTE — Telephone Encounter (Signed)
Call patient  Sleep study result  Date of study: 04/10/2020  Impression: Mild obstructive sleep apnea Snoring No significant oxygen desaturations  Recommendation: Options for treatment for mild obstructive sleep apnea will include 1.  CPAP therapy 2.  Oral device 3.  Watchful waiting with aggressive weight loss measures  An oral device may be considered, will need referral to a dentist  If there is significant symptoms of nonrestorative sleep, daytime fatigue/sleepiness-CPAP therapy with settings of 5-15 will be appropriate  Follow-up following initiation of treatment

## 2020-04-22 NOTE — Progress Notes (Signed)
[facility] [address] [place] Phone: [phone] Subjective:   I George [name] am serving as a Education administrator for Dr. Hulan Saas.  This visit occurred during the SARS-CoV-2 public health emergency.  Safety protocols were in place, including screening questions prior to the visit, additional usage of staff PPE, and extensive cleaning of exam room while observing appropriate contact time as indicated for disinfecting solutions.   I'm seeing this patient by the request  of:  Marin Olp, MD  CC: Low back pain follow-up  [id number]   George [name] is a 55 y.o. male coming in with complaint of back and neck pain. OMT 03/20/2020. Patient states he feels about the same today. Mild tightness of the back.  Still mild foot pain with his tae kwan do otherwise no pain.     Medications patient has been prescribed: None         Reviewed prior external information including notes and imaging from previsou exam, outside providers and external EMR if available.   As well as notes that were available from care everywhere and other healthcare systems.  Past medical history, social, surgical and family history all reviewed in electronic medical record.  No pertanent information unless stated regarding to the chief complaint.   Past Medical History:  Diagnosis Date  . Anxiety   . Colon polyps   . GERD (gastroesophageal reflux disease)   . Seasonal allergies     No Known Allergies   Review of Systems:  No headache, visual changes, nausea, vomiting, diarrhea, constipation, dizziness, abdominal pain, skin rash, fevers, chills, night sweats, weight loss, swollen lymph nodes, joint swelling, chest pain, shortness of breath, mood changes. POSITIVE muscle aches, mild body aches   Objective  Blood pressure 130/90, pulse 62, height 5\' 8"  (1.727 m), weight 181 lb (82.1 kg), SpO2 99 %.   General: No apparent distress alert and oriented x3 mood and  affect normal, dressed appropriately.  HEENT: Pupils equal, extraocular movements intact  Respiratory: Patient's speak in full sentences and does not appear short of breath  Cardiovascular: No lower extremity edema, non tender, no erythema  Neuro: Cranial nerves II through XII are intact, neurovascularly intact in all extremities with 2+ DTRs and 2+ pulses.  Gait normal with good balance and coordination.  MSK:  Non tender with full range of motion and good stability and symmetric strength and tone of shoulders, elbows, wrist, hip, knee and ankles bilaterally.  Back -low back exam does still have tightness noted in the thoracolumbar juncture right greater than left.  Patient does have tightness of the hip flexor on the right greater than left.  Tenderness in the paraspinal musculature L1-L2 and L3 right greater than left. Neck exam mild loss of lordosis.  Still some tenderness in the paraspinal musculature of the neck but negative Spurling's.  5 out of 5 strength of the upper extremities noted.  Osteopathic findings  C6 flexed rotated and side bent left T3 extended rotated and side bent right inhaled rib L1 flexed rotated and side bent right Sacrum right on right       Assessment and Plan: Lumbar radiculopathy Continue tightness of her arm.  Patient is doing relatively well.  Discussed yoga we will go to help him with some of the extension of the back.  We discussed changing some of the workouts and stretches to more unremarkable now with patient still doing tae kwan do  Patient will increase activity slowly and  follow-up again in 6 weeks     Nonallopathic problems  Decision today to treat with OMT was based on Physical Exam  After verbal consent patient was treated with HVLA, ME, FPR techniques in cervical, rib, thoracic, lumbar, and sacral  areas  Patient tolerated the procedure well with improvement in symptoms  Patient given exercises, stretches and lifestyle  modifications  See medications in patient instructions if given  Patient will follow up in 4-8 weeks      The above documentation has been reviewed and is accurate and complete George Pulley, DO       Note: This dictation was prepared with Dragon dictation along with smaller phrase technology. Any transcriptional errors that result from this process are unintentional.

## 2020-04-23 ENCOUNTER — Encounter: Payer: Self-pay | Admitting: Family Medicine

## 2020-04-23 ENCOUNTER — Ambulatory Visit: Payer: PRIVATE HEALTH INSURANCE | Admitting: Family Medicine

## 2020-04-23 ENCOUNTER — Other Ambulatory Visit: Payer: Self-pay

## 2020-04-23 VITALS — BP 130/90 | HR 62 | Ht 68.0 in | Wt 181.0 lb

## 2020-04-23 DIAGNOSIS — M999 Biomechanical lesion, unspecified: Secondary | ICD-10-CM | POA: Diagnosis not present

## 2020-04-23 DIAGNOSIS — M5416 Radiculopathy, lumbar region: Secondary | ICD-10-CM | POA: Diagnosis not present

## 2020-04-23 NOTE — Patient Instructions (Signed)
Good to see you Voltaren gel to the foot Yoga wheel for the back See me again in 6 weeks before black belt

## 2020-04-23 NOTE — Assessment & Plan Note (Signed)
Continue tightness of her arm.  Patient is doing relatively well.  Discussed yoga we will go to help him with some of the extension of the back.  We discussed changing some of the workouts and stretches to more unremarkable now with patient still doing tae kwan do  Patient will increase activity slowly and follow-up again in 6 weeks

## 2020-04-27 ENCOUNTER — Other Ambulatory Visit: Payer: Self-pay | Admitting: Family Medicine

## 2020-04-27 ENCOUNTER — Encounter: Payer: Self-pay | Admitting: Family Medicine

## 2020-05-19 ENCOUNTER — Encounter: Payer: Self-pay | Admitting: Family Medicine

## 2020-05-22 NOTE — Telephone Encounter (Signed)
Can sameday be used for below?      George [name], If this is approved, please schedule pt Tuesday at 3:40. If it is not, please call pt and schedule him. Thanks!

## 2020-05-26 NOTE — Telephone Encounter (Signed)
Pt is scheduled for Tuesday at 340.

## 2020-05-26 NOTE — Patient Instructions (Addendum)
Increase lexapro to 20 mg  Continue Symbicort twice a day-make sure to rinse mouth out after use  For albuterol-I want you to try this if you are feeling wheezing or increased cough-lets see if it makes a difference or not  Let me know in perhaps 3 weeks how often you are using your albuterol per week  If not improving may be worth getting allergist or pulmonology opinion on asthma management versus following up with Dr. Hilarie Fredrickson from reflux perspective  Recommended follow up: Return in about 2 months (around 07/27/2020) for follow up- or sooner if needed.

## 2020-05-26 NOTE — Progress Notes (Signed)
Phone [phone] In person visit   Subjective:   George [name] is a 55 y.o. year old very pleasant male patient who presents for/with See problem oriented charting Chief Complaint  Patient presents with  . Medication Management    Lexapro    This visit occurred during the SARS-CoV-2 public health emergency.  Safety protocols were in place, including screening questions prior to the visit, additional usage of staff PPE, and extensive cleaning of exam room while observing appropriate contact time as indicated for disinfecting solutions.   Past Medical History-  Patient Active Problem List   Diagnosis Date Noted  . Mild sleep apnea 05/27/2020    Priority: Medium  . GAD (generalized anxiety disorder) 03/17/2020    Priority: Medium  . Major depressive disorder with single episode, in full remission ([place]) 03/17/2020    Priority: Medium  . Hyperlipidemia, unspecified 07/03/2019    Priority: Medium  . Vitamin D deficiency 03/21/2018    Priority: Medium  . B12 deficiency 03/21/2018    Priority: Medium  . Allergic rhinitis due to pollen 04/23/2015    Priority: Medium  . Extrinsic asthma 08/11/2010    Priority: Medium  . Insomnia 08/11/2010    Priority: Medium  . GERD 12/08/2007    Priority: Medium  . Irritable bowel syndrome 12/08/2007    Priority: Medium  . Callus of foot 05/17/2019    Priority: Low  . Poor posture 12/22/2017    Priority: Low  . Capsulitis of metatarsophalangeal (MTP) joint of left foot 12/22/2017    Priority: Low  . Nonallopathic lesion of cervical region 10/28/2017    Priority: Low  . Nonallopathic lesion of rib cage 10/28/2017    Priority: Low  . Tibialis posterior tendinitis, right 08/30/2017    Priority: Low  . Extensor intersection syndrome of right wrist 02/24/2017    Priority: Low  . ECU (extensor carpi ulnaris), subluxation/dislocation, right, initial encounter 07/19/2016    Priority: Low  . Lumbar radiculopathy 08/14/2015    Priority: Low  .  Nonallopathic lesion of thoracic region 05/19/2015    Priority: Low  . Nonallopathic lesion of lumbosacral region 05/19/2015    Priority: Low  . Nonallopathic lesion of sacral region 05/19/2015    Priority: Low  . Piriformis syndrome of right side 04/24/2015    Priority: Low  . Hamstring tightness of right lower extremity 11/02/2019    Medications- reviewed and updated Current Outpatient Medications  Medication Sig Dispense Refill  . albuterol (VENTOLIN HFA) 108 (90 Base) MCG/ACT inhaler Inhale 2 puffs into the lungs every 6 (six) hours as needed for wheezing or shortness of breath. 18 g 2  . budesonide-formoterol (SYMBICORT) 160-4.5 MCG/ACT inhaler Inhale 2 puffs into the lungs 2 (two) times daily. 3 each 3  . cetirizine (ZYRTEC) 10 MG tablet Take 10 mg by mouth at bedtime.    Marland Kitchen dexlansoprazole (DEXILANT) 60 MG capsule Take 1 capsule (60 mg total) by mouth daily. 90 capsule 3  . fluticasone (FLONASE) 50 MCG/ACT nasal spray Place 2 sprays into both nostrils daily. 16 g 11  . gabapentin (NEURONTIN) 100 MG capsule TAKE 2 CAPSULES (200 MG TOTAL) BY MOUTH AT BEDTIME. 180 capsule 1  . ketoconazole (NIZORAL) 2 % cream Apply topically daily.    . Melatonin 5 MG TABS Take by mouth.    . escitalopram (LEXAPRO) 20 MG tablet Take 1 tablet (20 mg total) by mouth daily. 90 tablet 3   No current facility-administered medications for this visit.     Objective:  BP 102/78   Pulse 67   Temp 97.9 F (36.6 C) (Temporal)   Ht 5\' 8"  (1.727 m)   Wt 181 lb 3.2 oz (82.2 kg)   SpO2 97%   BMI 27.55 kg/m  Gen: NAD, resting comfortably CV: RRR no murmurs rubs or gallops Lungs: CTAB no crackles, wheeze, rhonchi Ext: no edema Skin: warm, dry     Assessment and Plan   # Depression/GAD S: Medication:lexapro 15 mg Therapy: still working with therapist   Recent stressors with work. 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 Was trying to potentially  move out and to a different position.  Depression screen [facility] 2/9 05/27/2020 03/17/2020 03/17/2020  Decreased Interest 0 0 0  Down, Depressed, Hopeless 3 1 0  PHQ - 2 Score 3 1 0  Altered sleeping 1 1 -  Tired, decreased energy 0 0 -  Change in appetite 0 1 -  Feeling bad or failure about yourself  0 0 -  Trouble concentrating 2 0 -  Moving slowly or fidgety/restless 0 0 -  Suicidal thoughts 0 0 -  PHQ-9 Score 6 3 -  Difficult doing work/chores Somewhat difficult Not difficult at all -   GAD 7 : Generalized Anxiety Score 05/27/2020 03/17/2020  Nervous, Anxious, on Edge 2 2  Control/stop worrying 1 2  Worry too much - different things 1 2  Trouble relaxing 3 3  Restless 0 0  Easily annoyed or irritable 3 2  Afraid - awful might happen 0 1  Total GAD 7 Score 10 12  Anxiety Difficulty Somewhat difficult Somewhat difficult   A/P: Depression in partial remission.  Generalized anxiety disorder slightly improved but still poorly controlled.  We opted to increase Lexapro to 20 mg and continue counseling.  I do think work stress is a very strong contributing factor-hopeful situation will improve her patient will find alternate options   # Asthma S: Maintenance Medication: Symbicort 2 puffs twice daily As needed medication: Albuterol-patient has not use this as he thought it should only be used if he has severe symptoms  Patient has been experiencing slight wheezing, dry cough, more phlegm.  He does have occasional mild epigastric tightness sensation-improved with Pepcid-no exertional chest pain.  He remains on Flonase and Zyrtec.  He remains on Dexilant 60 mg. A/P: Patient is not sure he has seen significant improvement on Symbicort-we discussed trying albuterol when he is having the symptoms and recording how frequently he is having symptoms and update me within a few weeks.  In regards to cough-reflux could contribute-we discussed possibly following up with Dr. Hilarie Fredrickson if symptoms fail to  improve.  Also is on Zyrtec and Flonase for allergies-I think overall he is on reasonable regimen for both issues.  We also consider referral to allergist will follow back up with pulmonology for their opinion on asthma.  Could have possible contribution from multiple issues including asthma, allergies, GERD    Mild sleep apnea S: Patient diagnosed with mild sleep apnea on home sleep study.CPAP versus oral appliance-he prefers oral appliance-also thinks may have TMJD A/P: We discussed referral to [place] and TMJ clinic to consider oral appliance-patient will give them a call      Recommended follow up: Return in about 2 months (around 07/27/2020) for follow up- or sooner if needed. Future Appointments  Date Time Provider [place]  06/04/2020 12:45 PM [name], DO [facility] None  08/05/2020  1:00 PM Marin Olp, MD [facility]  11/28/2020  8:20 AM Yong Channel Brayton Mars, MD [facility]    Lab/Order associations:   ICD-10-CM   1. Moderate persistent extrinsic asthma without complication  W62.03   2. Mild sleep apnea  G47.30   3. Allergic rhinitis due to pollen, unspecified seasonality  J30.1     Meds ordered this encounter  Medications  . escitalopram (LEXAPRO) 20 MG tablet    Sig: Take 1 tablet (20 mg total) by mouth daily.    Dispense:  90 tablet    Refill:  3    Do not fill unless patient requests.    Return precautions advised.  Garret Reddish, MD

## 2020-05-27 ENCOUNTER — Encounter: Payer: Self-pay | Admitting: Family Medicine

## 2020-05-27 ENCOUNTER — Other Ambulatory Visit: Payer: Self-pay

## 2020-05-27 ENCOUNTER — Ambulatory Visit: Payer: PRIVATE HEALTH INSURANCE | Admitting: Family Medicine

## 2020-05-27 VITALS — BP 102/78 | HR 67 | Temp 97.9°F | Ht 68.0 in | Wt 181.2 lb

## 2020-05-27 DIAGNOSIS — J301 Allergic rhinitis due to pollen: Secondary | ICD-10-CM

## 2020-05-27 DIAGNOSIS — G4730 Sleep apnea, unspecified: Secondary | ICD-10-CM | POA: Insufficient documentation

## 2020-05-27 DIAGNOSIS — J4540 Moderate persistent asthma, uncomplicated: Secondary | ICD-10-CM | POA: Diagnosis not present

## 2020-05-27 MED ORDER — ESCITALOPRAM OXALATE 20 MG PO TABS: 20.0000 mg | ORAL_TABLET | Freq: Every day | ORAL | 3 refills | Status: DC

## 2020-05-27 NOTE — Assessment & Plan Note (Signed)
S: Patient diagnosed with mild sleep apnea on home sleep study.CPAP versus oral appliance-he prefers oral appliance-also thinks may have TMJD A/P: We discussed referral to [place] and TMJ clinic to consider oral appliance-patient will give them a call

## 2020-06-03 NOTE — Progress Notes (Signed)
George [name] Phone: [phone] Subjective:   George [name], am serving as a scribe for Dr. Hulan Saas. This visit occurred during the SARS-CoV-2 public health emergency.  Safety protocols were in place, including screening questions prior to the visit, additional usage of staff PPE, and extensive cleaning of exam room while observing appropriate contact time as indicated for disinfecting solutions.   I'm seeing this patient by the request  of:  Marin Olp, MD  CC: Back and neck pain  [id number]  George [name] is a 55 y.o. male coming in with complaint of back and neck pain. R knee pain. Patient notes knee locking up on him while out at a store about 4 weeks ago. Has not had similar incidences since then. Patient has not been able to run due to knee pain. OMT 04/23/2020.   Medications patient has been prescribed: Gabapentin          Reviewed prior external information including notes and imaging from previsou exam, outside providers and external EMR if available.   As well as notes that were available from care everywhere and other healthcare systems.  Past medical history, social, surgical and family history all reviewed in electronic medical record.  [name] pertanent information unless stated regarding to the chief complaint.   Past Medical History:  Diagnosis Date   Anxiety    Colon polyps    GERD (gastroesophageal reflux disease)    Seasonal allergies     [name] Known Allergies   Review of Systems:  [name] headache, visual changes, nausea, vomiting, diarrhea, constipation, dizziness, abdominal pain, skin rash, fevers, chills, night sweats, weight loss, swollen lymph nodes, body aches, joint swelling, chest pain, shortness of breath, mood changes. POSITIVE muscle aches  Objective  Blood pressure 120/84, pulse (!) 53, height 5\' 8"  (1.727 m), weight 182 lb (82.6 kg), SpO2 98 %.   General: [name]  apparent distress alert and oriented x3 mood and affect normal, dressed appropriately.  HEENT: Pupils equal, extraocular movements intact  Respiratory: Patient's speak in full sentences and does not appear short of breath  Cardiovascular: [name] lower extremity edema, non tender, [name] erythema  Gait normal with good balance and coordination.  MSK: Right knee does have some mild tenderness over the medial joint line.  [name] effusion on exam today has negative McMurray's. Back - TTP limited range of motion   Limited musculoskeletal ultrasound was performed and interpreted by [name]  Limited ultrasound of patient's right knee shows the patient does have what appears to be a medial meniscal tear with very minimal displacement noted.  Seems to be more of the mid substance and posterior aspect.  Questionable small Baker cyst noted.  Questionable also a superior medial plica noted.  Osteopathic findings  C6 flexed rotated and side bent left T3 extended rotated and side bent right inhaled rib T9 extended rotated and side bent left L2 flexed rotated and side bent right Sacrum right on right       Assessment and Plan:  Right knee pain Patient continues to have some intermittent right knee pain.  On ultrasound today there is a tear with very mild displacement noted.  Questionable knee arthritis could be also contributing with based on patient's symptomatology.  Discussed with patient icing regimen and home exercises.  We discussed though that if this continues with the longevity of the symptoms I would consider the possibility of an MRI.  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  and then we will discuss again at follow-up in 4 weeks     Nonallopathic problems  Decision today to treat with OMT was based on Physical Exam  After verbal consent patient was treated with HVLA, ME, FPR techniques in cervical, rib, thoracic, lumbar, and sacral  areas  Patient tolerated the  procedure well with improvement in symptoms  Patient given exercises, stretches and lifestyle modifications  See medications in patient instructions if given  Patient will follow up in 4-8 weeks      The above documentation has been reviewed and is accurate and complete [name], DO       Note: This dictation was prepared with Dragon dictation along with smaller phrase technology. Any transcriptional errors that result from this process are unintentional.

## 2020-06-04 ENCOUNTER — Other Ambulatory Visit: Payer: Self-pay

## 2020-06-04 ENCOUNTER — Ambulatory Visit: Payer: PRIVATE HEALTH INSURANCE | Admitting: Family Medicine

## 2020-06-04 ENCOUNTER — Ambulatory Visit: Payer: Self-pay

## 2020-06-04 ENCOUNTER — Encounter: Payer: Self-pay | Admitting: Family Medicine

## 2020-06-04 VITALS — BP 120/84 | HR 53 | Ht 68.0 in | Wt 182.0 lb

## 2020-06-04 DIAGNOSIS — M999 Biomechanical lesion, unspecified: Secondary | ICD-10-CM

## 2020-06-04 DIAGNOSIS — M25561 Pain in right knee: Secondary | ICD-10-CM

## 2020-06-04 DIAGNOSIS — G8929 Other chronic pain: Secondary | ICD-10-CM

## 2020-06-04 NOTE — Patient Instructions (Signed)
Good to see you We will watch the knee and talk about MRI For the back stay hydrated and look up liquid IV See me again in 4-5 weeks

## 2020-06-04 NOTE — Assessment & Plan Note (Signed)
Patient continues to have some intermittent right knee pain.  On ultrasound today there is a tear with very mild displacement noted.  Questionable knee arthritis could be also contributing with based on patient's symptomatology.  Discussed with patient icing regimen and home exercises.  We discussed though that if this continues with the longevity of the symptoms I would consider the possibility of an MRI.  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

## 2020-06-13 ENCOUNTER — Encounter: Payer: Self-pay | Admitting: Family Medicine

## 2020-06-17 ENCOUNTER — Encounter: Payer: Self-pay | Admitting: Family Medicine

## 2020-06-17 ENCOUNTER — Other Ambulatory Visit: Payer: Self-pay

## 2020-06-17 ENCOUNTER — Ambulatory Visit (INDEPENDENT_AMBULATORY_CARE_PROVIDER_SITE_OTHER): Payer: PRIVATE HEALTH INSURANCE | Admitting: Family Medicine

## 2020-06-17 VITALS — BP 102/76 | HR 63 | Temp 97.3°F | Ht 68.0 in | Wt 177.6 lb

## 2020-06-17 DIAGNOSIS — F325 Major depressive disorder, single episode, in full remission: Secondary | ICD-10-CM | POA: Diagnosis not present

## 2020-06-17 DIAGNOSIS — G4730 Sleep apnea, unspecified: Secondary | ICD-10-CM

## 2020-06-17 DIAGNOSIS — J329 Chronic sinusitis, unspecified: Secondary | ICD-10-CM

## 2020-06-17 DIAGNOSIS — J4540 Moderate persistent asthma, uncomplicated: Secondary | ICD-10-CM

## 2020-06-17 DIAGNOSIS — B9689 Other specified bacterial agents as the cause of diseases classified elsewhere: Secondary | ICD-10-CM

## 2020-06-17 MED ORDER — AMOXICILLIN-POT CLAVULANATE 875-125 MG PO TABS: 1.0000 | ORAL_TABLET | Freq: Two times a day (BID) | ORAL | 0 refills | Status: AC

## 2020-06-17 NOTE — Patient Instructions (Addendum)
[email or web address]  Trial Augmentin for bacterial frontal sinusitis-I suspect if this is bacterial you should improve within the next 48 hours on this.  Please complete course and let me know if symptoms fail to improve or certainly if they worsen sooner than  Recommended follow up: keep may visit

## 2020-06-17 NOTE — Progress Notes (Signed)
Phone [phone] In person visit   Subjective:   George [name] is a 55 y.o. year old very pleasant male patient who presents for/with See problem oriented charting Chief Complaint  Patient presents with  . Headache    Patient has been having them for about two weeks now    This visit occurred during the SARS-CoV-2 public health emergency.  Safety protocols were in place, including screening questions prior to the visit, additional usage of staff PPE, and extensive cleaning of exam room while observing appropriate contact time as indicated for disinfecting solutions.   Past Medical History-  Patient Active Problem List   Diagnosis Date Noted  . Mild sleep apnea 05/27/2020    Priority: Medium  . GAD (generalized anxiety disorder) 03/17/2020    Priority: Medium  . Major depressive disorder with single episode, in full remission ([place]) 03/17/2020    Priority: Medium  . Hyperlipidemia, unspecified 07/03/2019    Priority: Medium  . Vitamin D deficiency 03/21/2018    Priority: Medium  . B12 deficiency 03/21/2018    Priority: Medium  . Allergic rhinitis due to pollen 04/23/2015    Priority: Medium  . Extrinsic asthma 08/11/2010    Priority: Medium  . Insomnia 08/11/2010    Priority: Medium  . GERD 12/08/2007    Priority: Medium  . Irritable bowel syndrome 12/08/2007    Priority: Medium  . Callus of foot 05/17/2019    Priority: Low  . Poor posture 12/22/2017    Priority: Low  . Capsulitis of metatarsophalangeal (MTP) joint of left foot 12/22/2017    Priority: Low  . Nonallopathic lesion of cervical region 10/28/2017    Priority: Low  . Nonallopathic lesion of rib cage 10/28/2017    Priority: Low  . Tibialis posterior tendinitis, right 08/30/2017    Priority: Low  . Extensor intersection syndrome of right wrist 02/24/2017    Priority: Low  . ECU (extensor carpi ulnaris), subluxation/dislocation, right, initial encounter 07/19/2016    Priority: Low  . Lumbar radiculopathy  08/14/2015    Priority: Low  . Nonallopathic lesion of thoracic region 05/19/2015    Priority: Low  . Nonallopathic lesion of lumbosacral region 05/19/2015    Priority: Low  . Nonallopathic lesion of sacral region 05/19/2015    Priority: Low  . Piriformis syndrome of right side 04/24/2015    Priority: Low  . Right knee pain 06/04/2020  . Hamstring tightness of right lower extremity 11/02/2019    Medications- reviewed and updated Current Outpatient Medications  Medication Sig Dispense Refill  . albuterol (VENTOLIN HFA) 108 (90 Base) MCG/ACT inhaler Inhale 2 puffs into the lungs every 6 (six) hours as needed for wheezing or shortness of breath. 18 g 2  . amoxicillin-clavulanate (AUGMENTIN) 875-125 MG tablet Take 1 tablet by mouth 2 (two) times daily for 7 days. 14 tablet 0  . budesonide-formoterol (SYMBICORT) 160-4.5 MCG/ACT inhaler Inhale 2 puffs into the lungs 2 (two) times daily. 3 each 3  . cetirizine (ZYRTEC) 10 MG tablet Take 10 mg by mouth at bedtime.    Marland Kitchen dexlansoprazole (DEXILANT) 60 MG capsule Take 1 capsule (60 mg total) by mouth daily. 90 capsule 3  . escitalopram (LEXAPRO) 20 MG tablet Take 1 tablet (20 mg total) by mouth daily. 90 tablet 3  . fluticasone (FLONASE) 50 MCG/ACT nasal spray Place 2 sprays into both nostrils daily. 16 g 11  . gabapentin (NEURONTIN) 100 MG capsule TAKE 2 CAPSULES (200 MG TOTAL) BY MOUTH AT BEDTIME. 180 capsule 1  .  Melatonin 5 MG TABS Take by mouth.    Marland Kitchen ketoconazole (NIZORAL) 2 % cream Apply topically daily. (Patient not taking: Reported on 06/17/2020)     No current facility-administered medications for this visit.     Objective:  BP 102/76   Pulse 63   Temp (!) 97.3 F (36.3 C) (Temporal)   Ht 5\' 8"  (1.727 m)   Wt 177 lb 9.6 oz (80.6 kg)   SpO2 98%   BMI 27.00 kg/m  Gen: NAD, resting comfortably Frontal sinuses tender to palpation as well as some erythema/swelling in nasal turbinates with some yellow and clear discharge.  Tympanic  membranes normal. CV: RRR no murmurs rubs or gallops Lungs: CTAB no crackles, wheeze, rhonchi Ext: no edema Skin: warm, dry  Neuro: CN II-XII intact, sensation and reflexes normal throughout, 5/5 muscle strength in bilateral upper and lower extremities. Normal finger to nose. Normal rapid alternating movements. No pronator drift. Normal romberg. Normal gait.      Assessment and Plan   #Headaches S:Patient read to me on 06/13/2020 complaining of steady headache for about 10 days.  He was wondering if it could be a sinus headache with ongoing pollen issues. Started shortly after last visit. Not debilitating but rather constant. Around bridge of nose and up into frontal sinuses. At its worst up to 6/10. 12 hour sudafed in AM and allertec at night through [employer]. Some advil- helps some but messes with stomach- sudafed helps the most. Slightly better last few days with sudafed overall. Using neti pot. Doesn't feel terribly congested - not blowing much out.   No facial or extremity weakness. No slurred words or trouble swallowing. no blurry vision or double vision. No paresthesias. No confusion or word finding difficulties.   A/P: I suspect this is bacterial sinusitis based on history and exam- provoked by allergies most likely as starting point. Treat with augmentin for 7 days. Reassuring neurological exam. Follow up if fails to improves or worsens    #Depression S: 20 days ago we increase patient from Lexapro 15 mg to 20 mg.  A lot of work-related stress-patient was hoping to get a bill deal to allow him to leave current work situation that did not go through. A/P: likely too soon to tell if going ot be effective and very tough stretch at work- reevaluate perhaps 4 weeks . Continue current meds -consider pristiq potentially if not improving -continue counseling  # Mild Sleep Apnea S:patient was going to give [facility] sleep medicine and TMJ clinic a call after our visit about 20 days ago.   A/P: has  not had a chance to call yet- encouraged him to call to schedule visit with [facility]   # Asthma S: Maintenance Medication: symbicort 2 puffs twice daily As needed medication: lat visit was not using albuterol despite some wheezing, dry cough, increased phlegm.  He was having exertional chest pain at bedtime was having some mild epigastric tightness sensation which improved with Pepcid in addition to his baseline Dexilant but sparingly.  He was already on Flonase and Zyrtec  Patient was to update me with how frequently he was using albuterol once we discussed using it when symptomatic.  From last visit-we also had discussed possible referral to Dr. Hilarie Fredrickson if ongoing cough symptoms/possible reflux contributing.  Allergy seemed reasonably well controlled but could refer to allergist or pulmonology  Today reports has only used twice in last 20 days. Cough has been better.  A/P: much improved control- continue symbicort  at present dose- he will let me know if symptoms worsen  Recommended follow up: keep may visit  Future Appointments  Date Time Provider [place]  07/07/2020 12:45 PM [name], DO [facility] None  08/05/2020  1:00 PM Marin Olp, MD [facility]  11/28/2020  8:20 AM Marin Olp, MD [facility]    Lab/Order associations:   ICD-10-CM   1. Bacterial sinusitis  J32.9    B96.89   2. Mild sleep apnea  G47.30   3. Moderate persistent extrinsic asthma without complication  K24.46   4. Major depressive disorder with single episode, in full remission ([place])  F32.5     Meds ordered this encounter  Medications  . amoxicillin-clavulanate (AUGMENTIN) 875-125 MG tablet    Sig: Take 1 tablet by mouth 2 (two) times daily for 7 days.    Dispense:  14 tablet    Refill:  0   Return precautions advised.  Garret Reddish, MD

## 2020-06-24 ENCOUNTER — Ambulatory Visit: Payer: PRIVATE HEALTH INSURANCE | Admitting: Family Medicine

## 2020-07-07 ENCOUNTER — Ambulatory Visit: Payer: PRIVATE HEALTH INSURANCE | Admitting: Family Medicine

## 2020-07-29 ENCOUNTER — Encounter: Payer: Self-pay | Admitting: Family Medicine

## 2020-07-29 NOTE — Progress Notes (Signed)
Phone [phone] Virtual visit via Video note   Subjective:  Chief complaint: Chief Complaint  Patient presents with  . Covid Positive    Pt had positive home COVID test yesterday. Pt is c/o body aches, dry non-productive cough, no fever. Pt is taking OTC Tylenol, Advil.    This visit type was conducted due to national recommendations for restrictions regarding the COVID-19 Pandemic (e.g. social distancing).  This format is felt to be most appropriate for this patient at this time balancing risks to patient and risks to population by having him in for in person visit.  No physical exam was performed (except for noted visual exam or audio findings with Telehealth visits).    Our team/I connected with George [name] at 10:40 AM EDT by a video enabled telemedicine application (doxy.me or caregility through epic) and verified that I am speaking with the correct person using two identifiers.  Location patient: [place] Location provider: [facility], office Persons participating in the virtual visit:  patient  Our team/I discussed the limitations of evaluation and management by telemedicine and the availability of in person appointments. In light of current covid-19 pandemic, patient also understands that we are trying to protect them by minimizing in office contact if at all possible.  The patient expressed consent for telemedicine visit and agreed to proceed. Patient understands insurance will be billed.   Past Medical History-  Patient Active Problem List   Diagnosis Date Noted  . Mild sleep apnea 05/27/2020    Priority: Medium  . GAD (generalized anxiety disorder) 03/17/2020    Priority: Medium  . Major depressive disorder with single episode, in full remission ([place]) 03/17/2020    Priority: Medium  . Hyperlipidemia, unspecified 07/03/2019    Priority: Medium  . Vitamin D deficiency 03/21/2018    Priority: Medium  . B12 deficiency 03/21/2018    Priority: Medium  . Allergic rhinitis  due to pollen 04/23/2015    Priority: Medium  . Extrinsic asthma 08/11/2010    Priority: Medium  . Insomnia 08/11/2010    Priority: Medium  . GERD 12/08/2007    Priority: Medium  . Irritable bowel syndrome 12/08/2007    Priority: Medium  . Callus of foot 05/17/2019    Priority: Low  . Poor posture 12/22/2017    Priority: Low  . Capsulitis of metatarsophalangeal (MTP) joint of left foot 12/22/2017    Priority: Low  . Nonallopathic lesion of cervical region 10/28/2017    Priority: Low  . Nonallopathic lesion of rib cage 10/28/2017    Priority: Low  . Tibialis posterior tendinitis, right 08/30/2017    Priority: Low  . Extensor intersection syndrome of right wrist 02/24/2017    Priority: Low  . ECU (extensor carpi ulnaris), subluxation/dislocation, right, initial encounter 07/19/2016    Priority: Low  . Lumbar radiculopathy 08/14/2015    Priority: Low  . Nonallopathic lesion of thoracic region 05/19/2015    Priority: Low  . Nonallopathic lesion of lumbosacral region 05/19/2015    Priority: Low  . Nonallopathic lesion of sacral region 05/19/2015    Priority: Low  . Piriformis syndrome of right side 04/24/2015    Priority: Low  . Right knee pain 06/04/2020  . Hamstring tightness of right lower extremity 11/02/2019    Medications- reviewed and updated Current Outpatient Medications  Medication Sig Dispense Refill  . albuterol (VENTOLIN HFA) 108 (90 Base) MCG/ACT inhaler Inhale 2 puffs into the lungs every 6 (six) hours as needed for wheezing or shortness of breath. [place]  g 2  . budesonide-formoterol (SYMBICORT) 160-4.5 MCG/ACT inhaler Inhale 2 puffs into the lungs 2 (two) times daily. 3 each 3  . cetirizine (ZYRTEC) 10 MG tablet Take 10 mg by mouth at bedtime.    Marland Kitchen dexlansoprazole (DEXILANT) 60 MG capsule Take 1 capsule (60 mg total) by mouth daily. 90 capsule 3  . fluticasone (FLONASE) 50 MCG/ACT nasal spray Place 2 sprays into both nostrils daily. 16 g 11  . gabapentin  (NEURONTIN) 100 MG capsule TAKE 2 CAPSULES (200 MG TOTAL) BY MOUTH AT BEDTIME. 180 capsule 1  . ketoconazole (NIZORAL) 2 % cream Apply topically daily.    . Melatonin 5 MG TABS Take by mouth.    . escitalopram (LEXAPRO) 20 MG tablet Take 1 tablet (20 mg total) by mouth daily. 90 tablet 3   No current facility-administered medications for this visit.     Objective:  Ht 5\' 8"  (1.727 m)   Wt 175 lb (79.4 kg)   BMI 26.61 kg/m  self reported vitals Gen: NAD, resting comfortably Lungs: nonlabored, normal respiratory rate  Skin: appears dry, no obvious rash     Assessment and Plan   Positive COVID S:patients first day of symptoms were Monday. Tested positive on Tuesday. He is having body aches, dry nonproductive cough. He has not had fever. Tylenol and advil have been helpful for the body aches.  Doing a great job with hydration. Using his symbicort inhaler to make sure lungs do not get aggravated. Feels slightly better this morning.  A/P: Patient with testing confirming covid 19 with first day of covid 19 symptoms Monday the 16th Vaccination status: vaccination x3   Therefore: - recommended patient watch closely for shortness of breath or confusion or worsening symptoms and if those occur patient should contact us immediately or seek care in the emergency department -recommended patient consider purchasing pulse oximeter and if levels 94% or below persistently- seek care at the hospital - Patient needs to self isolate  for at least 5 days since first symptom day-May 16 would be day 0-AND at least 24 hours fever free without fever reducing medications AND have improvement in respiratory symptoms- he can then go out for an additional 5 days but needs to be masked at all times-does have an N95 which would be ideal - earliest possible day out of self isolation sunday, recommendations for patient - work note not needed- semester over  -Patient should inform close contacts about exposure (anyone  patient been around unmasked for more than 15 minutes) - has already done so   -Patient opted for paxlovid-he will hold Flonase-not having athlete's foot so hold ketoconazole though technically only oral is not recommended -We discussed this is under EUA and full side effects are not known fully but we reviewed available information given by [facility]  # Depression S: Medication:lexapro 20 mg. No side effects have been noted. Has had some up and downs as world up and down and work is still tense. He wants to stay on same dose for summer  Depression screen [facility] 2/9 07/30/2020 05/27/2020 03/17/2020  Decreased Interest 0 0 0  Down, Depressed, Hopeless 1 3 1   PHQ - 2 Score 1 3 1   Altered sleeping 0 1 1  Tired, decreased energy 1 0 0  Change in appetite 0 0 1  Feeling bad or failure about yourself  0 0 0  Trouble concentrating 0 2 0  Moving slowly or fidgety/restless 0 0 0  Suicidal thoughts 0 0 0  PHQ-9 Score 2 6 3   Difficult doing work/chores Not difficult at all Somewhat difficult Not difficult at all  A/P: Depression significantly improved-he prefers to remain on current dose of medication  # Asthma S: Maintenance Medication: Symbicort 2 puffs twice daily As needed medication: Albuterol.  Patient has not had to use albuterol with COVID thankfully A/P: Patient doing very well despite COVID-did encourage him to continue to Symbicort-can use albuterol if needed and if worsening symptoms beyond that should let me know-would prefer to avoid prednisone unless we have to for the asthma  # cancelling visit for next week as issues addressed today. I cancelled this during visit.   Recommended follow up: Keep September visit Future Appointments  Date Time Provider [place]  08/08/2020 10:30 AM [name], DO [facility] None  11/28/2020  8:20 AM Yong Channel Brayton Mars, MD [facility]    Lab/Order associations:   ICD-10-CM   1. COVID-19  U07.1   2. Major depressive disorder with single  episode, in full remission ([place])  F32.5   3. Moderate persistent extrinsic asthma without complication  N47.09     Meds ordered this encounter  Medications  . nirmatrelvir/ritonavir EUA (PAXLOVID) TABS    Sig: Take 3 tablets by mouth 2 (two) times daily for 5 days. Patient GFR is 68. Take nirmatrelvir (150 mg) two tablets twice daily for 5 days and ritonavir (100 mg) one tablet twice daily for 5 days.    Dispense:  30 tablet    Refill:  0  . escitalopram (LEXAPRO) 20 MG tablet    Sig: Take 1 tablet (20 mg total) by mouth daily.    Dispense:  90 tablet    Refill:  3    Early fill please due to summer travel.    Return precautions advised.  Garret Reddish, MD

## 2020-07-29 NOTE — Patient Instructions (Signed)
Depression screen [facility] 2/9 05/27/2020 03/17/2020 03/17/2020  Decreased Interest 0 0 0  Down, Depressed, Hopeless 3 1 0  PHQ - 2 Score 3 1 0  Altered sleeping 1 1 -  Tired, decreased energy 0 0 -  Change in appetite 0 1 -  Feeling bad or failure about yourself  0 0 -  Trouble concentrating 2 0 -  Moving slowly or fidgety/restless 0 0 -  Suicidal thoughts 0 0 -  PHQ-9 Score 6 3 -  Difficult doing work/chores Somewhat difficult Not difficult at all -

## 2020-07-30 ENCOUNTER — Other Ambulatory Visit: Payer: Self-pay

## 2020-07-30 ENCOUNTER — Encounter: Payer: Self-pay | Admitting: Family Medicine

## 2020-07-30 ENCOUNTER — Telehealth (INDEPENDENT_AMBULATORY_CARE_PROVIDER_SITE_OTHER): Payer: PRIVATE HEALTH INSURANCE | Admitting: Family Medicine

## 2020-07-30 VITALS — Ht 68.0 in | Wt 175.0 lb

## 2020-07-30 DIAGNOSIS — U071 COVID-19: Secondary | ICD-10-CM

## 2020-07-30 DIAGNOSIS — J4540 Moderate persistent asthma, uncomplicated: Secondary | ICD-10-CM | POA: Diagnosis not present

## 2020-07-30 DIAGNOSIS — F325 Major depressive disorder, single episode, in full remission: Secondary | ICD-10-CM | POA: Diagnosis not present

## 2020-07-30 MED ORDER — NIRMATRELVIR/RITONAVIR (PAXLOVID)TABLET: 3.0000 | ORAL_TABLET | Freq: Two times a day (BID) | ORAL | 0 refills | Status: AC

## 2020-07-30 MED ORDER — ESCITALOPRAM OXALATE 20 MG PO TABS: 20.0000 mg | ORAL_TABLET | Freq: Every day | ORAL | 3 refills | Status: DC

## 2020-08-04 ENCOUNTER — Encounter: Payer: Self-pay | Admitting: Family Medicine

## 2020-08-05 ENCOUNTER — Ambulatory Visit: Payer: PRIVATE HEALTH INSURANCE | Admitting: Family Medicine

## 2020-08-07 ENCOUNTER — Encounter: Payer: Self-pay | Admitting: Family Medicine

## 2020-08-07 NOTE — Progress Notes (Signed)
George [name] Phone: [phone] Subjective:   George [name], am serving as a scribe for Dr. Hulan Saas. This visit occurred during the SARS-CoV-2 public health emergency.  Safety protocols were in place, including screening questions prior to the visit, additional usage of staff PPE, and extensive cleaning of exam room while observing appropriate contact time as indicated for disinfecting solutions.  I'm seeing this patient by the request  of:  Marin Olp, MD  CC: Neck pain and back pain follow-up  TIW:[id number]   06/04/2020 Patient continues to have some intermittent right knee pain.  On ultrasound today there is a tear with very mild displacement noted.  Questionable knee arthritis could be also contributing with based on patient's symptomatology.  Discussed with patient icing regimen and home exercises.  We discussed though that if this continues with the longevity of the symptoms I would consider the possibility of an MRI.  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 male coming in with complaint of back and neck pain. OMT 06/04/2020 and R knee pain f/u. Patient states that he has not done much for 2 weeks due to having COVID. Pain over R posterior hip. Right knee pain has dissipated.   Medications patient has been prescribed: Gabapentin          Reviewed prior external information including notes and imaging from previsou exam, outside providers and external EMR if available.   As well as notes that were available from care everywhere and other healthcare systems.  Past medical history, social, surgical and family history all reviewed in electronic medical record.  [name] pertanent information unless stated regarding to the chief complaint.   Past Medical History:  Diagnosis Date  .  Anxiety   . Colon polyps   . GERD (gastroesophageal reflux disease)   . Seasonal allergies     [name] Known Allergies   Review of Systems:  [name] headache, visual changes, nausea, vomiting, diarrhea, constipation, dizziness, abdominal pain, skin rash, fevers, chills, night sweats, weight loss, swollen lymph nodes, body aches, joint swelling, chest pain, shortness of breath, mood changes. POSITIVE muscle aches  Objective  Blood pressure 102/74, pulse 72, height 5\' 8"  (1.727 m), weight 173 lb (78.5 kg), SpO2 98 %.   General: [name] apparent distress alert and oriented x3 mood and affect normal, dressed appropriately.  HEENT: Pupils equal, extraocular movements intact  Respiratory: Patient's speak in full sentences and does not appear short of breath  Cardiovascular: [name] lower extremity edema, non tender, [name] erythema  Gait normal with good balance and coordination.  MSK:  Non tender with full range of motion and good stability and symmetric strength and tone of shoulders, elbows, wrist, hip, knee and ankles bilaterally.  Back -low back exam does have some very mild tightness mostly in the thoracolumbar juncture.  Patient has a negative straight leg test noted today.  Patient does have [name] tightness with extending the back some.  Osteopathic findings  C4 flexed rotated and side bent right T3 extended rotated and side bent right inhaled rib T7 extended rotated and side bent left L2 flexed rotated and side bent right Sacrum right on right       Assessment and Plan:  Lumbar radiculopathy Likely patient is not having any radicular symptoms at this time.  Discussed  posture and ergonomics.  Patient will be traveling.  Discussed with patient about continuing to stay active and working on core stabilization.  Discussed icing regimen and home exercises, which activities to do which wants to avoid.  Follow-up again in 6 to 8 weeks    Nonallopathic problems  Decision today to treat with OMT was based  on Physical Exam  After verbal consent patient was treated with HVLA, ME, FPR techniques in cervical, rib, thoracic, lumbar, and sacral  areas  Patient tolerated the procedure well with improvement in symptoms  Patient given exercises, stretches and lifestyle modifications  See medications in patient instructions if given  Patient will follow up in 4-8 weeks      The above documentation has been reviewed and is accurate and complete [name], DO       Note: This dictation was prepared with Dragon dictation along with smaller phrase technology. Any transcriptional errors that result from this process are unintentional.

## 2020-08-08 ENCOUNTER — Other Ambulatory Visit: Payer: Self-pay

## 2020-08-08 ENCOUNTER — Encounter: Payer: Self-pay | Admitting: Family Medicine

## 2020-08-08 ENCOUNTER — Ambulatory Visit: Payer: PRIVATE HEALTH INSURANCE | Admitting: Family Medicine

## 2020-08-08 VITALS — BP 102/74 | HR 72 | Ht 68.0 in | Wt 173.0 lb

## 2020-08-08 DIAGNOSIS — M9904 Segmental and somatic dysfunction of sacral region: Secondary | ICD-10-CM

## 2020-08-08 DIAGNOSIS — M5416 Radiculopathy, lumbar region: Secondary | ICD-10-CM

## 2020-08-08 DIAGNOSIS — M9908 Segmental and somatic dysfunction of rib cage: Secondary | ICD-10-CM

## 2020-08-08 DIAGNOSIS — M9902 Segmental and somatic dysfunction of thoracic region: Secondary | ICD-10-CM | POA: Diagnosis not present

## 2020-08-08 DIAGNOSIS — M9901 Segmental and somatic dysfunction of cervical region: Secondary | ICD-10-CM | POA: Diagnosis not present

## 2020-08-08 DIAGNOSIS — M9903 Segmental and somatic dysfunction of lumbar region: Secondary | ICD-10-CM | POA: Diagnosis not present

## 2020-08-08 NOTE — Patient Instructions (Signed)
See me in August

## 2020-08-08 NOTE — Assessment & Plan Note (Signed)
Likely patient is not having any radicular symptoms at this time.  Discussed posture and ergonomics.  Patient will be traveling.  Discussed with patient about continuing to stay active and working on core stabilization.  Discussed icing regimen and home exercises, which activities to do which wants to avoid.  Follow-up again in 6 to 8 weeks

## 2020-09-22 ENCOUNTER — Encounter: Payer: Self-pay | Admitting: Family Medicine

## 2020-09-22 NOTE — Telephone Encounter (Signed)
Please print, fill out and fax for pt and let him know when complete.

## 2020-09-26 ENCOUNTER — Encounter: Payer: Self-pay | Admitting: Family Medicine

## 2020-10-24 ENCOUNTER — Other Ambulatory Visit: Payer: Self-pay

## 2020-10-24 NOTE — Progress Notes (Signed)
  [name] [place] Phone: [phone] Subjective:   George [name], am serving as a scribe for Dr. Hulan Saas.  I'm seeing this patient by the request  of:  Marin Olp, MD  CC: Back pain and neck pain follow-up  [id number]  George [name] is a 55 y.o. male coming in with complaint of back and neck pain. OMT 08/08/2020. Patient states that he just had a drive back from [place] and is a little tight.  Patient describes the pain as a dull, throbbing aching pain.  Nothing that significant improvement is very tight.  Patient would like to start his taekwondo again.  Medications patient has been prescribed: Gabapentin  Taking: Gabapentin            Past Medical History:  Diagnosis Date   Anxiety    Colon polyps    GERD (gastroesophageal reflux disease)    Seasonal allergies     No Known Allergies   Review of Systems:  No headache, visual changes, nausea, vomiting, diarrhea, constipation, dizziness, abdominal pain, skin rash, fevers, chills, night sweats, weight loss, swollen lymph nodes, body aches, joint swelling, chest pain, shortness of breath, mood changes. POSITIVE muscle aches  Objective  Blood pressure 104/82, pulse 71, height '5\' 8"'$  (1.727 m), weight 183 lb (83 kg), SpO2 97 %.   General: No apparent distress alert and oriented x3 mood and affect normal, dressed appropriately.  HEENT: Pupils equal, extraocular movements intact  Respiratory: Patient's speak in full sentences and does not appear short of breath  Cardiovascular: No lower extremity edema, non tender, no erythema  More tightness in the mid back than usual.  Tightness in the paraspinal musculature of the lumbar spine noted as well.  Significant tightness of the hamstrings right greater than left.  Negative straight leg test though noted.  Osteopathic findings  C2 flexed rotated and side bent right C6 flexed rotated and side bent  left T3 extended rotated and side bent right inhaled rib T7 extended rotated and side bent left L2 flexed rotated and side bent right Sacrum right on right       Assessment and Plan:  Lumbar radiculopathy Chronic problem but overall stable.  Patient does respond well to different activities.  Discussed icing regimen and home exercises.  Patient will see me again in 6 weeks.  Responding well to manipulation.   Nonallopathic problems  Decision today to treat with OMT was based on Physical Exam  After verbal consent patient was treated with HVLA, ME, FPR techniques in cervical, rib, thoracic, lumbar, and sacral  areas  Patient tolerated the procedure well with improvement in symptoms  Patient given exercises, stretches and lifestyle modifications  See medications in patient instructions if given  Patient will follow up in 4-8 weeks      The above documentation has been reviewed and is accurate and complete [name], DO        Note: This dictation was prepared with Dragon dictation along with smaller phrase technology. Any transcriptional errors that result from this process are unintentional.

## 2020-10-24 NOTE — Progress Notes (Unsigned)
  [facility] [place] Phone: [phone] Subjective:    I'm seeing this patient by the request  of:  Marin Olp, MD  CC:   [id number]  Krishay Allert is a 55 y.o. male coming in with complaint of back and neck pain. OMT 08/08/2020. Patient states   Medications patient has been prescribed: Gabapentin  Taking:         Reviewed prior external information including notes and imaging from previsou exam, outside providers and external EMR if available.   As well as notes that were available from care everywhere and other healthcare systems.  Past medical history, social, surgical and family history all reviewed in electronic medical record.  No pertanent information unless stated regarding to the chief complaint.   Past Medical History:  Diagnosis Date   Anxiety    Colon polyps    GERD (gastroesophageal reflux disease)    Seasonal allergies     No Known Allergies   Review of Systems:  No headache, visual changes, nausea, vomiting, diarrhea, constipation, dizziness, abdominal pain, skin rash, fevers, chills, night sweats, weight loss, swollen lymph nodes, body aches, joint swelling, chest pain, shortness of breath, mood changes. POSITIVE muscle aches  Objective  There were no vitals taken for this visit.   General: No apparent distress alert and oriented x3 mood and affect normal, dressed appropriately.  HEENT: Pupils equal, extraocular movements intact  Respiratory: Patient's speak in full sentences and does not appear short of breath  Cardiovascular: No lower extremity edema, non tender, no erythema  Neuro: Cranial nerves II through XII are intact, neurovascularly intact in all extremities with 2+ DTRs and 2+ pulses.  Gait normal with good balance and coordination.  MSK:  Non tender with full range of motion and good stability and symmetric strength and tone of shoulders, elbows, wrist, hip, knee and  ankles bilaterally.  Back - Normal skin, Spine with normal alignment and no deformity.  No tenderness to vertebral process palpation.  Paraspinous muscles are not tender and without spasm.   Range of motion is full at neck and lumbar sacral regions  Osteopathic findings  C2 flexed rotated and side bent right C6 flexed rotated and side bent left T3 extended rotated and side bent right inhaled rib T9 extended rotated and side bent left L2 flexed rotated and side bent right Sacrum right on right       Assessment and Plan:    Nonallopathic problems  Decision today to treat with OMT was based on Physical Exam  After verbal consent patient was treated with HVLA, ME, FPR techniques in cervical, rib, thoracic, lumbar, and sacral  areas  Patient tolerated the procedure well with improvement in symptoms  Patient given exercises, stretches and lifestyle modifications  See medications in patient instructions if given  Patient will follow up in 4-8 weeks      The above documentation has been reviewed and is accurate and complete Jacqualin Combes       Note: This dictation was prepared with Dragon dictation along with smaller phrase technology. Any transcriptional errors that result from this process are unintentional.

## 2020-10-27 ENCOUNTER — Other Ambulatory Visit: Payer: Self-pay | Admitting: Family Medicine

## 2020-10-28 ENCOUNTER — Ambulatory Visit (INDEPENDENT_AMBULATORY_CARE_PROVIDER_SITE_OTHER): Payer: No Typology Code available for payment source | Admitting: Family Medicine

## 2020-10-28 ENCOUNTER — Encounter: Payer: Self-pay | Admitting: Family Medicine

## 2020-10-28 ENCOUNTER — Other Ambulatory Visit: Payer: Self-pay

## 2020-10-28 VITALS — BP 104/82 | HR 71 | Ht 68.0 in | Wt 183.0 lb

## 2020-10-28 DIAGNOSIS — M9901 Segmental and somatic dysfunction of cervical region: Secondary | ICD-10-CM

## 2020-10-28 DIAGNOSIS — M9908 Segmental and somatic dysfunction of rib cage: Secondary | ICD-10-CM

## 2020-10-28 DIAGNOSIS — M9904 Segmental and somatic dysfunction of sacral region: Secondary | ICD-10-CM | POA: Diagnosis not present

## 2020-10-28 DIAGNOSIS — M5416 Radiculopathy, lumbar region: Secondary | ICD-10-CM

## 2020-10-28 DIAGNOSIS — M9903 Segmental and somatic dysfunction of lumbar region: Secondary | ICD-10-CM

## 2020-10-28 DIAGNOSIS — M9902 Segmental and somatic dysfunction of thoracic region: Secondary | ICD-10-CM

## 2020-10-28 MED ORDER — GABAPENTIN 100 MG PO CAPS: 200.0000 mg | ORAL_CAPSULE | Freq: Every day | ORAL | 1 refills | Status: DC

## 2020-10-28 NOTE — Assessment & Plan Note (Signed)
Chronic problem but overall stable.  Patient does respond well to different activities.  Discussed icing regimen and home exercises.  Patient will see me again in 6 weeks.  Responding well to manipulation.

## 2020-10-28 NOTE — Patient Instructions (Addendum)
Good to see you  Good luck with school Continue with the swords  Continue doing what you are doing  See me again in 6 weeks

## 2020-11-28 ENCOUNTER — Encounter: Payer: PRIVATE HEALTH INSURANCE | Admitting: Family Medicine

## 2020-12-01 ENCOUNTER — Encounter: Payer: Self-pay | Admitting: Family Medicine

## 2020-12-02 ENCOUNTER — Telehealth: Payer: Self-pay

## 2020-12-02 NOTE — Telephone Encounter (Signed)
Please schedule f/u for pt.  

## 2020-12-02 NOTE — Telephone Encounter (Signed)
LVM for pt to call back.

## 2020-12-02 NOTE — Telephone Encounter (Signed)
Patient Name: George [name] Gender: Male DOB: 10/21/1965 Age: 55 Y 15 M 12 D Return Phone Number: [phone] (Primary) Address: City/ State/ Zip: [place]  [address] [facility] [place] [facility] Garret Reddish- MD Contact Type Call Who Is Calling Patient / Member / Family / Caregiver Call Type Triage / Clinical Relationship To Patient Self Return Phone Number [phone] (Primary) Chief Complaint FAINTING or [place] Reason for Call Symptomatic / Request for [place] states he passed out and fell and hit his head. Additional Comment Pt took a Trazadone for back pain last night. The client ransferred pt to [place] and has no appointment availability until Wed. Translation No Nurse Assessment Nurse: Harvie Bridge, RN, Beth Date/Time (Eastern Time): 12/01/2020 2:54:26 PM Confirm and document reason for call. If symptomatic, describe symptoms. ---Pt took a Trazadone for back pain last night. Along with gabapentin, zyrtec, melatonin. LOC this am, 2 minutes, witnessed. The client transferred pt to [place] and has no appointment availability until Wed. Does the patient have any new or worsening symptoms? ---Yes Will a triage be completed? ---Yes Related visit to physician within the last 2 weeks? ---No Does the PT have any chronic conditions? (i.e. diabetes, asthma, this includes High risk factors for pregnancy, etc.) ---Yes List chronic conditions. ---asthma Is this a behavioral health or substance abuse call? ---No Guidelines Guideline Title Affirmed Question Affirmed Notes Nurse Date/Time (Eastern Time) Fainting [1] Age > 50 years AND [2] now alert and feels fine Newhart, RN, Eustaquio Maize 12/01/2020 2:58:10 PM PLEASE NOTE: All timestamps contained within this report are represented as [place] Standard Time. CONFIDENTIALTY NOTICE: This fax transmission is  intended only for the addressee. It contains information that is legally privileged, confidential or otherwise protected from use or disclosure. If you are not the intended recipient, you are strictly prohibited from reviewing, disclosing, copying using or disseminating any of this information or taking any action in reliance on or regarding this information. If you have received this fax in error, please notify us immediately by telephone so that we can arrange for its return to [place]. Phone: [phone], Toll-Free: [phone], Fax: [phone] Page: 2 of 2 Call Id: [id number] [place]. Time Eilene Ghazi Time) Disposition Final User 12/01/2020 2:52:50 PM Send to Urgent Harrie Jeans 12/01/2020 3:00:07 PM Go to ED Now (or PCP triage) Yes Newhart, RN, Beth Caller Disagree/Comply Comply Caller Understands Yes PreDisposition Did not know what to do Care Advice Given Per Guideline GO TO ED NOW (OR PCP TRIAGE): * IF PCP SECOND-LEVEL TRIAGE REQUIRED: You may need to be seen. Your doctor (or NP/PA) will want to talk with you to decide what's best. I'll page the provider on-call now. If you haven't heard from the provider (or me) within 30 minutes, go directly to the [facility] at _____________ [place]: * It is better and safer if another adult drives instead of you. CALL BACK IF: * You become worse CARE ADVICE given per Fainting (Adult) guideline. Referrals GO TO FACILITY UNDECIDED

## 2020-12-03 ENCOUNTER — Ambulatory Visit (INDEPENDENT_AMBULATORY_CARE_PROVIDER_SITE_OTHER): Payer: No Typology Code available for payment source | Admitting: Physician Assistant

## 2020-12-03 ENCOUNTER — Encounter: Payer: Self-pay | Admitting: Physician Assistant

## 2020-12-03 ENCOUNTER — Other Ambulatory Visit: Payer: Self-pay

## 2020-12-03 VITALS — BP 118/82 | HR 61 | Temp 98.2°F | Ht 68.0 in | Wt 182.2 lb

## 2020-12-03 DIAGNOSIS — I951 Orthostatic hypotension: Secondary | ICD-10-CM | POA: Diagnosis not present

## 2020-12-03 DIAGNOSIS — R001 Bradycardia, unspecified: Secondary | ICD-10-CM

## 2020-12-03 NOTE — Telephone Encounter (Signed)
Pt is scheduled with George [name] today (12/03/2020) at 4pm.

## 2020-12-03 NOTE — Progress Notes (Signed)
Subjective:    Patient ID: George [name], male    DOB: [birth year], 55 y.o.   MRN: [id number]  Chief Complaint  Patient presents with   Loss of Consciousness    Sunday took a Trazodone,     Shoulder Pain    HPI Patient is in today for LOC f/up.  Sunday night 11/30/20 he was struggling to fall asleep and he took Trazodone with his regular medications in the evening time which include gabapentin, Zyrtec, and melatonin.  Monday morning 12/01/20 felt groggy, got up and went to the bathroom, he was brushing his teeth, and then suddenly his wife was waking him up from the floor.  States that he landed between the bathtub and the toilet.  He had no warning signs prior to the loss of consciousness.  He did not have any postictal symptoms to indicate that he had a seizure.  No seizure history.  He states that he was tired after the incident though and he did sleep for the next few hours after that.  He denies any nauseousness or vomiting.  He denies any chest pain or shortness of breath.  No palpitations.  No brain fog or confusion.  States that mentally he has been normal since the incident.  He does complain of some upper back and shoulder pain and has a faint bruise on his left shoulder.  He also has some pain with palpation over the left temple.  He relates his pain to the fall and possibly what ever he hit on the way down.  Patient also states he has been under more stress recently.  He has been having to do more at home while his wife has been down with herniated disc.  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   Past Medical History:  Diagnosis Date   Anxiety    Colon polyps    GERD (gastroesophageal reflux disease)    Seasonal allergies     Past Surgical History:  Procedure Laterality Date   COLONOSCOPY     UPPER GASTROINTESTINAL ENDOSCOPY      Family History  Problem Relation Age of Onset   Obesity Father    Asthma Father    Hypertension Father     Bladder Cancer Father    CAD Father        prior smoker. cabg in late 70s   Colon polyps Mother    Healthy Sister    Bipolar disorder Brother    Colon cancer Maternal Grandmother    Diabetes Neg Hx    Early death Neg Hx    Heart disease Neg Hx    Hyperlipidemia Neg Hx    Kidney disease Neg Hx    Stroke Neg Hx    Esophageal cancer Neg Hx    Stomach cancer Neg Hx     Social History   Tobacco Use   Smoking status: Never   Smokeless tobacco: Never  Vaping Use   Vaping Use: Never used  Substance Use Topics   Alcohol use: Yes    Comment: occasional    Drug use: No     No Known Allergies  Review of Systems REFER TO HPI FOR PERTINENT POSITIVES AND NEGATIVES      Objective:     BP 118/82   Pulse 61   Temp 98.2 F (36.8 C)   Ht 5\' 8"  (1.727 m)   Wt 182 lb 4 oz (82.7 kg)   SpO2  98%   BMI 27.71 kg/m   Wt Readings from Last 3 Encounters:  12/03/20 182 lb 4 oz (82.7 kg)  10/28/20 183 lb (83 kg)  08/08/20 173 lb (78.5 kg)    BP Readings from Last 3 Encounters:  12/03/20 118/82  10/28/20 104/82  08/08/20 102/74     Physical Exam Vitals and nursing note reviewed.  Constitutional:      General: He is not in acute distress.    Appearance: Normal appearance. He is not toxic-appearing.  HENT:     Head: Normocephalic and atraumatic.     Right Ear: Tympanic membrane, ear canal and external ear normal.     Left Ear: Tympanic membrane, ear canal and external ear normal.     Nose: Nose normal.     Mouth/Throat:     Mouth: Mucous membranes are moist.     Pharynx: Oropharynx is clear.  Eyes:     Extraocular Movements: Extraocular movements intact.     Conjunctiva/sclera: Conjunctivae normal.     Pupils: Pupils are equal, round, and reactive to light.  Cardiovascular:     Rate and Rhythm: Regular rhythm. Bradycardia present.     Pulses: Normal pulses.     Heart sounds: Normal heart sounds.  Pulmonary:     Effort: Pulmonary effort is normal.     Breath  sounds: Normal breath sounds.  Abdominal:     General: Abdomen is flat. Bowel sounds are normal.     Palpations: Abdomen is soft.     Tenderness: There is no abdominal tenderness.  Musculoskeletal:        General: Normal range of motion.     Cervical back: Normal range of motion and neck supple.     Comments: No C-spine tenderness.  Full range of motion in the neck.  Skin:    General: Skin is warm and dry.     Comments: He has a small yellowed bruise over the anterior part of his left shoulder, but he has full range of motion.  He also has a faint bruise over his left temple that is tender to palpation.    Neurological:     General: No focal deficit present.     Mental Status: He is alert and oriented to person, place, and time.     Cranial Nerves: No cranial nerve deficit.     Sensory: No sensory deficit.     Motor: No weakness.     Coordination: Coordination normal.     Gait: Gait normal.     Deep Tendon Reflexes: Reflexes normal.  Psychiatric:        Mood and Affect: Mood normal.        Behavior: Behavior normal.        Thought Content: Thought content normal.        Judgment: Judgment normal.       Assessment & Plan:   1. Orthostatic syncope 2. Sinus bradycardia on ECG -New syncopal event on 12/01/2020 -Patient has history of vasovagal events, but states this was different as he did not have any warning symptoms. -No red flags on neuro exam and he is completely back to his baseline.  Discussed head CT with patient and decided together it was probably not necessary at this time.  Warning symptoms discussed with patient and he knows to get to the emergency department in the case of any severe headache or confusion or other acute symptoms. -Cervical spine imaging not indicated either as he does not have any  pain or tenderness. -Performed EKG in office and my interpretation shows new sinus bradycardia at 53 bpm without any ST or T wave changes.  This is different from his normal  sinus rhythm EKG that was done on 02/14/2018. -I called his PCP Dr. Yong Channel and discussed the situation with him.  He agrees that most likely this was an isolated event secondary to the combination of medications that he took the night before and it was probably an orthostatic syncopal event.  However, with the new bradycardia on his EKG, discussed that it is imperative to have consult with cardiology on this as well and he should not drive until that appointment just to be safe.  This note was prepared with assistance of Systems analyst. Occasional wrong-word or sound-a-like substitutions may have occurred due to the inherent limitations of voice recognition software.  Time Spent: 38 minutes of total time was spent on the date of the encounter performing the following actions: chart review prior to seeing the patient, obtaining history, performing a medically necessary exam, counseling on the treatment plan, placing orders, and documenting in our EHR.    Kariann Wecker M Maleeya Peterkin, PA-C

## 2020-12-04 ENCOUNTER — Encounter: Payer: Self-pay | Admitting: Family Medicine

## 2020-12-04 ENCOUNTER — Encounter: Payer: Self-pay | Admitting: Physician Assistant

## 2020-12-08 ENCOUNTER — Ambulatory Visit: Payer: No Typology Code available for payment source | Admitting: Interventional Cardiology

## 2020-12-08 ENCOUNTER — Ambulatory Visit: Payer: No Typology Code available for payment source | Admitting: Family Medicine

## 2020-12-08 ENCOUNTER — Encounter: Payer: Self-pay | Admitting: Interventional Cardiology

## 2020-12-08 ENCOUNTER — Other Ambulatory Visit: Payer: Self-pay

## 2020-12-08 VITALS — BP 120/73 | HR 72 | Ht 68.0 in | Wt 184.0 lb

## 2020-12-08 DIAGNOSIS — E782 Mixed hyperlipidemia: Secondary | ICD-10-CM

## 2020-12-08 DIAGNOSIS — R001 Bradycardia, unspecified: Secondary | ICD-10-CM

## 2020-12-08 DIAGNOSIS — R55 Syncope and collapse: Secondary | ICD-10-CM

## 2020-12-08 NOTE — Patient Instructions (Signed)
Medication Instructions:  Your physician recommends that you continue on your current medications as directed. Please refer to the Current Medication list given to you today.  *If you need a refill on your cardiac medications before your next appointment, please call your pharmacy*   Lab Work: none If you have labs (blood work) drawn today and your tests are completely normal, you will receive your results only by: [place] (if you have MyChart) OR A paper copy in the mail If you have any lab test that is abnormal or we need to change your treatment, we will call you to review the results.   Testing/Procedures: Dr Irish Lack recommends you have a Calcium Score CT scan   Follow-Up: At [facility], you and your health needs are our priority.  As part of our continuing mission to provide you with exceptional heart care, we have created designated Provider Care Teams.  These Care Teams include your primary Cardiologist (physician) and Advanced Practice Providers (APPs -  Physician Assistants and Nurse Practitioners) who all work together to provide you with the care you need, when you need it.  We recommend signing up for the patient portal called "MyChart".  Sign up information is provided on this After Visit Summary.  MyChart is used to connect with patients for Virtual Visits (Telemedicine).  Patients are able to view lab/test results, encounter notes, upcoming appointments, etc.  Non-urgent messages can be sent to your provider as well.   To learn more about what you can do with MyChart, go to [email or web address].    Your next appointment:   Based on results  The format for your next appointment:   In Person  Provider:   You may see Larae Grooms, MD or one of the following Advanced Practice Providers on your designated Care Team:   Melina Copa, PA-C Ermalinda Barrios, PA-C   Other Instructions

## 2020-12-08 NOTE — Progress Notes (Signed)
Cardiology Office Note   Date:  12/08/2020   ID:  George [name], DOB [birth year], MRN [id number]  PCP:  Marin Olp, MD    No chief complaint on file.  syncope  Wt Readings from Last 3 Encounters:  12/08/20 184 lb (83.5 kg)  12/03/20 182 lb 4 oz (82.7 kg)  10/28/20 183 lb (83 kg)       History of Present Illness: George [name] is a 55 y.o. male who is being seen today for the evaluation of syncope at the request of Allwardt, Alyssa M, PA-C.   His father is a retired [employer].  Prior history reveals: "New syncopal event on 12/01/2020 -Patient has history of vasovagal events, but states this was different as he did not have any warning symptoms. -No red flags on neuro exam and he is completely back to his baseline.  Discussed head CT with patient and decided together it was probably not necessary at this time.  Warning symptoms discussed with patient and he knows to get to the emergency department in the case of any severe headache or confusion or other acute symptoms. -Cervical spine imaging not indicated either as he does not have any pain or tenderness. -Performed EKG in office and my interpretation shows new sinus bradycardia at 53 bpm without any ST or T wave changes.  This is different from his normal sinus rhythm EKG that was done on 02/14/2018. -I called his PCP Dr. Yong Channel and discussed the situation with him.  He agrees that most likely this was an isolated event secondary to the combination of medications that he took the night before and it was probably an orthostatic syncopal event.  However, with the new bradycardia on his EKG, discussed that it is imperative to have consult with cardiology on this as well and he should not drive until that appointment just to be safe."  Normal stress echo in 2009. 15:00 on the treadmill.   Further background reveals that he took a trazodone the night before which is unusual.  He was putting his contacts in and then he remembers  being on the floor.    He takes gabapentin daily and did that day as well.  He has had vasovagal episodes in the past since childhood.  No particular trigger for except rough airplane rides, and with fasting on Yom Kippur.This episode was different than the vagal episode.   Since this episode, he has felt fatigued.  Allergies have been worse.  Stress increased with work and his wife's back problem.  No further syncope or dizziness.  Denies : Chest pain. Dizziness. Leg edema. Nitroglycerin use. Orthopnea. Palpitations. Paroxysmal nocturnal dyspnea. Shortness of breath.    No recent heart testing.   Father with triple bypass in his late 56s.   Teaches at [employer], [employer].    He exercises regularly, tae kwan do, swimming, elliptical  Past Medical History:  Diagnosis Date   Anxiety    Colon polyps    GERD (gastroesophageal reflux disease)    Seasonal allergies     Past Surgical History:  Procedure Laterality Date   COLONOSCOPY     UPPER GASTROINTESTINAL ENDOSCOPY       Current Outpatient Medications  Medication Sig Dispense Refill   albuterol (VENTOLIN HFA) 108 (90 Base) MCG/ACT inhaler Inhale 2 puffs into the lungs every 6 (six) hours as needed for wheezing or shortness of breath. 18 g 2   budesonide-formoterol (SYMBICORT) 160-4.5 MCG/ACT inhaler Inhale 2 puffs into the lungs  2 (two) times daily. 3 each 3   cetirizine (ZYRTEC) 10 MG tablet Take 10 mg by mouth at bedtime.     dexlansoprazole (DEXILANT) 60 MG capsule Take 1 capsule (60 mg total) by mouth daily. 90 capsule 3   escitalopram (LEXAPRO) 20 MG tablet Take 1 tablet (20 mg total) by mouth daily. 90 tablet 3   fluticasone (FLONASE) 50 MCG/ACT nasal spray Place 2 sprays into both nostrils daily. 16 g 11   gabapentin (NEURONTIN) 100 MG capsule Take 2 capsules (200 mg total) by mouth at bedtime. 180 capsule 1   ketoconazole (NIZORAL) 2 % cream Apply topically daily.     Melatonin 5 MG TABS Take by mouth.     No  current facility-administered medications for this visit.    Allergies:   Patient has no known allergies.    Social History:  The patient  reports that he has never smoked. He has never used smokeless tobacco. He reports current alcohol use. He reports that he does not use drugs.   Family History:  The patient's family history includes Asthma in his father; Bipolar disorder in his brother; Bladder Cancer in his father; CAD in his father; Colon cancer in his maternal grandmother; Colon polyps in his mother; Healthy in his sister; Hypertension in his father; Obesity in his father.    ROS:  Please see the history of present illness.   Otherwise, review of systems are positive for recent syncope in the setting of trazodone.   All other systems are reviewed and negative.    PHYSICAL EXAM: VS:  BP 120/73   Pulse 72   Ht 5\' 8"  (1.727 m)   Wt 184 lb (83.5 kg)   SpO2 96%   BMI 27.98 kg/m  , BMI Body mass index is 27.98 kg/m. GEN: Well nourished, well developed, in no acute distress HEENT: normal Neck: no JVD, carotid bruits, or masses Cardiac: RRR; no murmurs, rubs, or gallops,no edema  Respiratory:  clear to auscultation bilaterally, normal work of breathing GI: soft, nontender, nondistended, + BS MS: no deformity or atrophy Skin: warm and dry, no rash Neuro:  Strength and sensation are intact Psych: euthymic mood, full affect   EKG:   The ekg ordered today demonstrates sinus bradycardia, no ST changes   Recent Labs: No results found for requested labs within last 8760 hours.   Lipid Panel    Component Value Date/Time   CHOL 231 (H) 07/03/2019 1603   TRIG 177.0 (H) 07/03/2019 1603   HDL 47.80 07/03/2019 1603   CHOLHDL 5 07/03/2019 1603   VLDL 35.4 07/03/2019 1603   LDLCALC 148 (H) 07/03/2019 1603   LDLDIRECT 159.3 06/25/2011 0926     Other studies Reviewed: Additional studies/ records that were reviewed today with results demonstrating: labs reviewed.   ASSESSMENT  AND PLAN:  Syncope: Occurred in the setting of Gabapentin, lexapro and trazodone added.  No cardiac abnormality on exam.  Narrow QRS.  No conduction disease noted on ECG, only sinus brady, but he is quite fit and exercises regularly.  No evidence of orthostatic hypotension based on multiple blood pressure readings in the office, done in different positions.  No significant change in heart rate.  I encouraged him to stay well-hydrated.  He should avoid trazodone as well.  He could try low-dose melatonin if he had trouble sleeping.  He feels that he was under a lot of stress at that time as well due to his wife's health issues.  This has  improved.  If he had any further lightheadedness, could check echocardiogram but given his normal exam, I do not think it is necessary at this time.  Having monitor would be low yield as well.  I did not give him the go ahead to drive.  He will watch for any symptoms.  Okay to go back to exercise gradually.  He states he feels better when he exercises.  Hyperlipidemia: Consider calcium scoring CT for further risk assessment.  He is agreeable to this.  I think he is on the borderline of needing a statin potentially.  If his calcium score is greater than 100 or in the greater than 75th percentile, statin would be indicated. GERD: Sx controlled.    Current medicines are reviewed at length with the patient today.  The patient concerns regarding his medicines were addressed.  The following changes have been made:  No change  Labs/ tests ordered today include:  No orders of the defined types were placed in this encounter.   Recommend 150 minutes/week of aerobic exercise Low fat, low carb, high fiber diet recommended  Disposition:   FU based on calcium scoring CT.   Signed, Larae Grooms, MD  12/08/2020 10:46 AM    [facility] [place], [place], [place]  [address] Phone: [phone]; Fax: [phone]

## 2020-12-08 NOTE — Progress Notes (Signed)
George [name] [place] [address] [place] Phone: [phone] Subjective:   George [name], am serving as a scribe for Dr. Hulan Saas. This visit occurred during the SARS-CoV-2 public health emergency.  Safety protocols were in place, including screening questions prior to the visit, additional usage of staff PPE, and extensive cleaning of exam room while observing appropriate contact time as indicated for disinfecting solutions.   I'm seeing this patient by the request  of:  Marin Olp, MD  CC: Neck pain and back pain follow-up  CZY:[id number]  George [name] is a 55 y.o. male coming in with complaint of back and neck pain. OMT 10/28/2020. Patient states back and neck pain is unchanged. He fell a week ago and now has a pain between his scapula running up to his neck. Also more than a week ago he dropped something on his left foot and thinks that the 4th toe may be broken patient states able to walk on it.  He does have soreness noted.  Patient states that both of the soreness in the foot and the neck seems to be worse at night.  Reviewing patient's chart patient did have a syncopal event.  Has seen cardiology and would feel that this was more secondary to mixing of a sleep medication and the gabapentin.  Patient has not had any trouble since.  Medications patient has been prescribed: Gabapentin  Taking: Yes         Reviewed prior external information including notes and imaging from previsou exam, outside providers and external EMR if available.   As well as notes that were available from care everywhere and other healthcare systems.  Past medical history, social, surgical and family history all reviewed in electronic medical record.  No pertanent information unless stated regarding to the chief complaint.   Past Medical History:  Diagnosis Date   Anxiety    Colon polyps    GERD (gastroesophageal reflux disease)    Seasonal allergies      No Known Allergies   Review of Systems:  No headache, visual changes, nausea, vomiting, diarrhea, constipation, dizziness, abdominal pain, skin rash, fevers, chills, night sweats, weight loss, swollen lymph nodes, body aches, joint swelling, chest pain, shortness of breath, mood changes. POSITIVE muscle aches  Objective  Blood pressure 120/70, pulse (!) 53, height 5\' 8"  (1.727 m), weight 183 lb (83 kg), SpO2 99 %.   General: No apparent distress alert and oriented x3 mood and affect normal, dressed appropriately.  HEENT: Pupils equal, extraocular movements intact  Respiratory: Patient's speak in full sentences and does not appear short of breath  Cardiovascular: No lower extremity edema, non tender, no erythema  Neck exam does have some loss of lordosis.  Some tenderness to palpation in the paraspinal musculature.  Patient does have some tightness of the trapezius bilaterally.  No spinous process tenderness noted.  5 out of 5 strength of the upper extremities.  Foot exam shows the patient has no significant contusion noted.  Patient is minorly tender over the phalanx itself of the fourth toe.  No abnormality of the nail.  No angulation noted.  Limited muscular skeletal ultrasound was performed and interpreted by Hulan Saas, M  Limited ultrasound unremarkable for any type of cortical irregularity but does have some soft tissue hypoechoic changes that is consistent still with a soft tissue injury. Impression: Foot contusion  Osteopathic findings  C2 flexed rotated and side bent right C7 flexed rotated and side  bent left T3 extended rotated and side bent right inhaled rib T7 extended rotated and side bent left L2 flexed rotated and side bent right Sacrum right on right       Assessment and Plan:  Whiplash Patient does have a whiplash injury.  Discussed with patient about icing regimen and home exercises coming to physical activity with less pain.  We will get x-rays  secondary to patient actually having a syncopal episode.  Patient does not have any pinpoint tenderness.  Patient should do relatively well but will be having a long drive.  Patient does have the gabapentin that has been helping overall.  Follow-up again in 4 weeks  Foot contusion Patient has what appears to be more of a foot contusion.  Patient likely will do well.  No significant other changes.  Follow-up with me again in 6 to 8 weeks   Nonallopathic problems  Decision today to treat with OMT was based on Physical Exam  After verbal consent patient was treated with HVLA, ME, FPR techniques in cervical, rib, thoracic, lumbar, and sacral  areas  Patient tolerated the procedure well with improvement in symptoms  Patient given exercises, stretches and lifestyle modifications  See medications in patient instructions if given  Patient will follow up in 4-8 weeks      The above documentation has been reviewed and is accurate and complete [name], DO        Note: This dictation was prepared with Dragon dictation along with smaller phrase technology. Any transcriptional errors that result from this process are unintentional.

## 2020-12-09 ENCOUNTER — Ambulatory Visit: Payer: No Typology Code available for payment source | Admitting: Family Medicine

## 2020-12-09 ENCOUNTER — Ambulatory Visit (INDEPENDENT_AMBULATORY_CARE_PROVIDER_SITE_OTHER): Payer: No Typology Code available for payment source

## 2020-12-09 ENCOUNTER — Ambulatory Visit: Payer: Self-pay

## 2020-12-09 VITALS — BP 120/70 | HR 53 | Ht 68.0 in | Wt 183.0 lb

## 2020-12-09 DIAGNOSIS — M9901 Segmental and somatic dysfunction of cervical region: Secondary | ICD-10-CM

## 2020-12-09 DIAGNOSIS — M542 Cervicalgia: Secondary | ICD-10-CM

## 2020-12-09 DIAGNOSIS — M9904 Segmental and somatic dysfunction of sacral region: Secondary | ICD-10-CM

## 2020-12-09 DIAGNOSIS — S9032XA Contusion of left foot, initial encounter: Secondary | ICD-10-CM

## 2020-12-09 DIAGNOSIS — M9903 Segmental and somatic dysfunction of lumbar region: Secondary | ICD-10-CM

## 2020-12-09 DIAGNOSIS — M9908 Segmental and somatic dysfunction of rib cage: Secondary | ICD-10-CM | POA: Diagnosis not present

## 2020-12-09 DIAGNOSIS — S134XXA Sprain of ligaments of cervical spine, initial encounter: Secondary | ICD-10-CM

## 2020-12-09 DIAGNOSIS — S9030XA Contusion of unspecified foot, initial encounter: Secondary | ICD-10-CM | POA: Insufficient documentation

## 2020-12-09 DIAGNOSIS — M79672 Pain in left foot: Secondary | ICD-10-CM

## 2020-12-09 DIAGNOSIS — M9902 Segmental and somatic dysfunction of thoracic region: Secondary | ICD-10-CM

## 2020-12-09 NOTE — Assessment & Plan Note (Signed)
Patient has what appears to be more of a foot contusion.  Patient likely will do well.  No significant other changes.  Follow-up with me again in 6 to 8 weeks

## 2020-12-09 NOTE — Assessment & Plan Note (Signed)
Patient does have a whiplash injury.  Discussed with patient about icing regimen and home exercises coming to physical activity with less pain.  We will get x-rays secondary to patient actually having a syncopal episode.  Patient does not have any pinpoint tenderness.  Patient should do relatively well but will be having a long drive.  Patient does have the gabapentin that has been helping overall.  Follow-up again in 4 weeks

## 2020-12-09 NOTE — Patient Instructions (Signed)
Xray today Good to see you! Mid whiplash Arnica lotion to all the bruises See you again in 4 weeks Safe travels to [place]

## 2020-12-19 ENCOUNTER — Other Ambulatory Visit: Payer: Self-pay | Admitting: Interventional Cardiology

## 2020-12-19 DIAGNOSIS — E785 Hyperlipidemia, unspecified: Secondary | ICD-10-CM

## 2020-12-19 DIAGNOSIS — R55 Syncope and collapse: Secondary | ICD-10-CM

## 2020-12-23 ENCOUNTER — Inpatient Hospital Stay: Admission: RE | Admit: 2020-12-23 | Payer: Self-pay | Source: Ambulatory Visit

## 2020-12-25 ENCOUNTER — Other Ambulatory Visit: Payer: Self-pay

## 2020-12-25 ENCOUNTER — Ambulatory Visit (INDEPENDENT_AMBULATORY_CARE_PROVIDER_SITE_OTHER)
Admission: RE | Admit: 2020-12-25 | Discharge: 2020-12-25 | Disposition: A | Discharge status: Home / Self Care | Payer: Self-pay | Source: Ambulatory Visit | Attending: Interventional Cardiology | Admitting: Interventional Cardiology

## 2020-12-25 DIAGNOSIS — E785 Hyperlipidemia, unspecified: Secondary | ICD-10-CM

## 2020-12-25 DIAGNOSIS — R55 Syncope and collapse: Secondary | ICD-10-CM

## 2020-12-26 ENCOUNTER — Telehealth: Payer: Self-pay | Admitting: *Deleted

## 2020-12-26 DIAGNOSIS — E782 Mixed hyperlipidemia: Secondary | ICD-10-CM

## 2020-12-26 MED ORDER — ROSUVASTATIN CALCIUM 10 MG PO TABS: 10.0000 mg | ORAL_TABLET | Freq: Every day | ORAL | 3 refills | Status: DC

## 2020-12-26 NOTE — Telephone Encounter (Signed)
Reviewed recommendation w patient. He will begin Crestor 10mg  daily and return for labs in 3 months.  In agreement with diet and exercies recommendations.  Pt asks about overread and would like Dr. Hassell Done input on pericardial cyst:  I adv they are typically small and usually found incidentally like this and as long as no symptoms we monitor over time and be aware that it is present.    FINDINGS: Well-defined ovoid shaped lesion measuring 0.8 x 2.3 cm (axial image 22 of series 3) which demonstrates low-attenuation adjacent to the right atrial appendage, intimately associated with the pericardium, likely a small pericardial cyst. Within the visualized portions of the thorax there are no suspicious appearing pulmonary nodules or masses, there is no acute consolidative airspace disease, no pleural effusions, no pneumothorax and no lymphadenopathy. Visualized portions of the upper abdomen are unremarkable. There are no aggressive appearing lytic or blastic lesions noted in the visualized portions of the skeleton.   IMPRESSION: 1. Probable small pericardial cyst, as above.

## 2020-12-26 NOTE — Telephone Encounter (Signed)
-----   Message from Jettie Booze, MD sent at 12/26/2020  8:12 AM EDT ----- Calcium score 98 which was 81st percentile. LDL 148 at last check.  WOuld recommend statin.  Start rosuvastatin 10 mg daily.  REcheck lipids and liver tests in 3 months.  Whole food, plant based diet with 150 minutes of exercise per week recommended.

## 2020-12-26 NOTE — Telephone Encounter (Signed)
Nothing to do about the pericardial cyst.  It is unlikely to cause any issues going forward.

## 2020-12-29 NOTE — Telephone Encounter (Signed)
Patient notified

## 2021-01-06 ENCOUNTER — Ambulatory Visit: Payer: No Typology Code available for payment source | Admitting: Family Medicine

## 2021-01-06 ENCOUNTER — Ambulatory Visit: Payer: No Typology Code available for payment source | Admitting: Sports Medicine

## 2021-01-06 ENCOUNTER — Other Ambulatory Visit: Payer: Self-pay

## 2021-01-06 ENCOUNTER — Ambulatory Visit (INDEPENDENT_AMBULATORY_CARE_PROVIDER_SITE_OTHER): Payer: No Typology Code available for payment source

## 2021-01-06 VITALS — BP 110/78 | HR 64 | Ht 68.0 in | Wt 181.0 lb

## 2021-01-06 DIAGNOSIS — M9905 Segmental and somatic dysfunction of pelvic region: Secondary | ICD-10-CM

## 2021-01-06 DIAGNOSIS — G5701 Lesion of sciatic nerve, right lower limb: Secondary | ICD-10-CM

## 2021-01-06 DIAGNOSIS — M25551 Pain in right hip: Secondary | ICD-10-CM

## 2021-01-06 DIAGNOSIS — M9903 Segmental and somatic dysfunction of lumbar region: Secondary | ICD-10-CM | POA: Diagnosis not present

## 2021-01-06 DIAGNOSIS — M9902 Segmental and somatic dysfunction of thoracic region: Secondary | ICD-10-CM | POA: Diagnosis not present

## 2021-01-06 NOTE — Patient Instructions (Addendum)
Good to see you  Get X ray on your way out Injection given today PT referral placed they will call you but number is provided below Follow up in 2-4 weeks

## 2021-01-06 NOTE — Progress Notes (Signed)
George [name] D.George [name] Phone: [phone]   Assessment and Plan:     1. Right hip pain 2. Piriformis syndrome of right side 3. Somatic dysfunction of thoracic region 4. Somatic dysfunction of lumbar region 5. Somatic dysfunction of pelvic region -Chronic with exacerbation, subsequent visit - Generalized right external hip pain has improved with HEP and OMT, however still limiting patient without significant improvement - OMT performed today.  Tolerated well per procedure note below - Patient elected for greater trochanter CSI.  Tolerated well per note below - Start physical therapy.  Ambulatory referral to Physical Therapy - Due to no significant improvement with conservative means, x-rays obtained of right hip and pelvis.  My interpretation: No acute fracture, no dislocation.  Mild cortical irregularities along fibular head and acetabulum - DG HIP UNILAT WITH PELVIS 2-3 VIEWS RIGHT; Future   Decision today to treat with OMT was based on Physical Exam   After verbal consent patient was treated with HVLA (high velocity low amplitude), ME (muscle energy), FPR (flex positional release), ST (soft tissue), PC/PD (Pelvic Compression/ Pelvic Decompression) techniques in  thoracic, lumbar, and pelvic areas. Patient tolerated the procedure well with improvement in symptoms.  Patient educated on potential side effects of soreness and recommended to rest, hydrate, and use Tylenol as needed for pain control.   Procedure: Greater trochanteric bursal injection Side: Right  Risks explained and consent was given verbally. The site was cleaned with alcohol prep. A steroid injection was performed with patient in the lateral side-lying position at area of maximum tenderness over greater trochanter using 36mL of 1% lidocaine without epinephrine and 20mL of kenalog 40mg /ml. This was well tolerated and resulted in symptomatic relief.  Needle was  removed, hemostasis achieved, and post injection instructions were explained.  Pt was advised to call or return to clinic if these symptoms worsen or fail to improve as anticipated.   Pertinent previous records reviewed include sports med office notes   Follow Up: In 2 to 4 weeks to evaluate benefit of greater trochanter CSI and adding PT.   Subjective:   I, George [name], am serving as a scribe for George [name]  Chief Complaint: OMT  HPI:   01/06/21 Patient is a 55 year old male presenting with hip pain. Patient was last seen by Dr. Tamala Julian on 12/09/20 and had OMT. Today patient states that the pain is in he R hip/IT band down the right leg that hurts. Patient does like to run, but has not been able to and is looking to figure out anything else that he can do. Patient does the HEP, OMT, ICE, and he would like to see if PT or an injection can help more with the pain.   Relevant Historical Information: GERD, IBS  Additional pertinent review of systems negative.  Current Outpatient Medications  Medication Sig Dispense Refill   albuterol (VENTOLIN HFA) 108 (90 Base) MCG/ACT inhaler Inhale 2 puffs into the lungs every 6 (six) hours as needed for wheezing or shortness of breath. 18 g 2   budesonide-formoterol (SYMBICORT) 160-4.5 MCG/ACT inhaler Inhale 2 puffs into the lungs 2 (two) times daily. 3 each 3   cetirizine (ZYRTEC) 10 MG tablet Take 10 mg by mouth at bedtime.     dexlansoprazole (DEXILANT) 60 MG capsule Take 1 capsule (60 mg total) by mouth daily. 90 capsule 3   escitalopram (LEXAPRO) 20 MG tablet Take 1 tablet (20 mg total) by  mouth daily. 90 tablet 3   fluticasone (FLONASE) 50 MCG/ACT nasal spray Place 2 sprays into both nostrils daily. 16 g 11   gabapentin (NEURONTIN) 100 MG capsule Take 2 capsules (200 mg total) by mouth at bedtime. 180 capsule 1   ketoconazole (NIZORAL) 2 % cream Apply topically daily.     Melatonin 5 MG TABS Take by mouth.     rosuvastatin (CRESTOR) 10  MG tablet Take 1 tablet (10 mg total) by mouth daily. 90 tablet 3   No current facility-administered medications for this visit.      Objective:     Vitals:   01/06/21 0815  BP: 110/78  Pulse: 64  SpO2: 99%  Weight: 181 lb (82.1 kg)  Height: 5\' 8"  (1.727 m)      Body mass index is 27.52 kg/m.    Physical Exam:     General: Well-appearing, cooperative, sitting comfortably in no acute distress.   OMT Physical Exam:  ASIS Compression Test: Positive Right Thoracic: TTP paraspinal, T4-8 RLSR Lumbar: TTP paraspinal, L1-3 RRSL Pelvis: Right innominate out flare  Bilateral hip: No deformity, swelling or wasting ROM Fexion 90, ext 30, IR 45, ER 45 NTTP over the hip flexors, adductors, inguinal canal, pubic ramus, greater troch, glute musculature, si joint, lumbar spine Negative log roll with FROM Positive FABER on right, negative on left Negative FADIR Positive bilaterally piriformis test Positive trendelenberg with standing on left leg Gait normal  Electronically signed by:  George [name] D.[facility] 9:20 AM 01/06/21

## 2021-01-15 ENCOUNTER — Ambulatory Visit: Payer: No Typology Code available for payment source | Admitting: Physical Therapy

## 2021-01-15 ENCOUNTER — Ambulatory Visit: Payer: No Typology Code available for payment source | Attending: Pulmonary Disease

## 2021-01-15 ENCOUNTER — Other Ambulatory Visit: Payer: Self-pay

## 2021-01-15 DIAGNOSIS — M25551 Pain in right hip: Secondary | ICD-10-CM | POA: Insufficient documentation

## 2021-01-15 DIAGNOSIS — M6281 Muscle weakness (generalized): Secondary | ICD-10-CM | POA: Insufficient documentation

## 2021-01-15 DIAGNOSIS — G5701 Lesion of sciatic nerve, right lower limb: Secondary | ICD-10-CM | POA: Insufficient documentation

## 2021-01-16 NOTE — Therapy (Signed)
[place], [place], [address] Phone: [phone]   Fax:  [phone]  Physical Therapy Evaluation  Patient Details  Name: George [name] MRN: [id number] Date of Birth: 16-Feb-1966 Referring Provider (PT): Laurin Coder, MD   Encounter Date: 01/15/2021   PT End of Session - 01/16/21 1417     Visit Number 1    Number of Visits 17    Date for PT Re-Evaluation 03/13/21    Authorization Type [employer] - FOTO 6th and 10th    PT Start Time 1746    PT Stop Time 1825    PT Time Calculation (min) 39 min    Activity Tolerance Patient tolerated treatment well;No increased pain    Behavior During Therapy WFL for tasks assessed/performed             Past Medical History:  Diagnosis Date   Anxiety    Colon polyps    GERD (gastroesophageal reflux disease)    Seasonal allergies     Past Surgical History:  Procedure Laterality Date   COLONOSCOPY     UPPER GASTROINTESTINAL ENDOSCOPY      There were no vitals filed for this visit.    Subjective Assessment - 01/15/21 1749     Subjective Pt presents to PT with reports of chronic R posterior hip pain and discomfort. He is very active and jogs approx 3 miles on treadmill a few times a week, but for the past few months has noticed increased R hip pain after. He denies any N/T down R LE, does note that prolonged sitting increases pain. Would like to get back to being more active, especially with running and taekwondo.    Pertinent History chronic hx of R posterior hip pain    Limitations Sitting;Other (comment)   jogging   How long can you sit comfortably? 10-15    How long can you stand comfortably? indefinite    How long can you walk comfortably? indefinite    Patient Stated Goals decrease R hip pain and get back to active lifestyle    Currently in Pain? Yes    Pain Score 3    8/10 at worst   Pain Location Hip    Pain Orientation Right;Posterior    Pain Type  Chronic pain    Pain Onset More than a month ago    Pain Frequency Intermittent    Aggravating Factors  prolonged sitting, jogging    Pain Relieving Factors stretching                [facility] PT Assessment - 01/16/21 0001       Assessment   Medical Diagnosis M25.551 (ICD-10-CM) - Right hip pain  G57.01 (ICD-10-CM) - Piriformis syndrome of right side    Referring Provider (PT) Laurin Coder, MD    Prior Therapy None      Precautions   Precautions None      Restrictions   Weight Bearing Restrictions No      Balance Screen   Has the patient fallen in the past 6 months Yes    How many times? yes - once due to syncopal episode    Has the patient had a decrease in activity level because of a fear of falling?  No    Is the patient reluctant to leave their home because of a fear of falling?  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                PT Education - 01/16/21 1417     Education Details Eval findings, FOTO, HEP, POC     Person(s) Educated Patient    Methods Explanation;Demonstration;Handout    Comprehension Verbalized understanding;Returned demonstration              PT Short Term Goals - 01/16/21 1421       PT SHORT TERM GOAL #1   Title Pt will be compliant with initial HEP for improved comfort and carryover    Baseline initial HEP given    Time 3    Period Weeks    Status New    Target Date 02/06/21               PT Long Term Goals - 01/16/21 1421       PT LONG TERM GOAL #1   Title Pt will improve FOTO function score to predicted value as proxy for functional improvement    Time 8    Period Weeks    Status New    Target Date 03/13/21      PT LONG TERM GOAL #2   Title Pt will self report R hip pain no greater than 2/10 at worst for improved comfort and function    Baseline 5/10 at wrost    Time 8    Period Weeks    Status New    Target Date 03/13/21      PT LONG TERM GOAL #3   Title Pt will be able to jog at least 30 min without inc of R hip pain for improved functional ability to return to previous active lifestyle    Baseline unable    Time 8    Period Weeks    Status New    Target Date 03/13/21                    Plan - 01/16/21 1418     Clinical Impression Statement Pt is a pleasant 55 y/o M who presents to PT with reports of chronic R hip pain. Physical findings are consistent with referring provider impression, as he demos TTP to R piriformis and postive FAIR test. Due to listed impairments he is operating below PLOF and would  benefit from skilled PT services in order to decrease pain and get back to active lifestyle. Will assess response to HEP and progress as able.    Personal Factors and Comorbidities Time since onset of injury/illness/exacerbation    Examination-Activity Limitations Squat;Sit;Other   Jogging   Examination-Participation Restrictions Yard Work;Community Activity    Stability/Clinical Decision Making Stable/Uncomplicated    Clinical  Decision Making Low    Rehab Potential Excellent    PT Frequency 2x / week    PT Duration 8 weeks    PT Treatment/Interventions ADLs/Self Care Home Management;Electrical Stimulation;Cryotherapy;Moist Heat;Gait training;Stair training;Functional mobility training;Therapeutic activities;Therapeutic exercise;Balance training;Neuromuscular re-education;Patient/family education;Manual techniques;Dry needling;Passive range of motion;Vasopneumatic Device;Taping;Joint Manipulations;Spinal Manipulations    PT Next Visit Plan assess response to HEP; progress hip strengthening as able; DN R piriformis    PT Home Exercise Plan Access Code: [email or web address]    Consulted and Agree with Plan of Care Patient             Patient will benefit from skilled therapeutic intervention in order to improve the following deficits and impairments:  Decreased activity tolerance, Decreased mobility, Decreased strength, Pain, Impaired flexibility  Visit Diagnosis: Pain in right hip  Muscle weakness (generalized)     Problem List Patient Active Problem List   Diagnosis Date Noted   Whiplash 12/09/2020   Foot contusion 12/09/2020   Right knee pain 06/04/2020   Mild sleep apnea 05/27/2020   GAD (generalized anxiety disorder) 03/17/2020   Major depressive disorder with single episode, in full remission ([place]) 03/17/2020   Hamstring tightness of right lower extremity 11/02/2019   Hyperlipidemia, unspecified 07/03/2019   Callus of foot 05/17/2019   Vitamin D deficiency 03/21/2018   B12 deficiency 03/21/2018   Poor posture 12/22/2017   Capsulitis of metatarsophalangeal (MTP) joint of left foot 12/22/2017   Nonallopathic lesion of cervical region 10/28/2017   Nonallopathic lesion of rib cage 10/28/2017   Tibialis posterior tendinitis, right 08/30/2017   Extensor intersection syndrome of right wrist 02/24/2017   ECU (extensor carpi ulnaris), subluxation/dislocation, right, initial encounter 07/19/2016   Lumbar  radiculopathy 08/14/2015   Nonallopathic lesion of thoracic region 05/19/2015   Nonallopathic lesion of lumbosacral region 05/19/2015   Nonallopathic lesion of sacral region 05/19/2015   Piriformis syndrome of right side 04/24/2015   Allergic rhinitis due to pollen 04/23/2015   Extrinsic asthma 08/11/2010   Insomnia 08/11/2010   GERD 12/08/2007   Irritable bowel syndrome 12/08/2007    Ward Chatters, PT 01/16/2021, 2:28 PM  [facility] [address] [place], [place], [address] Phone: [phone]   Fax:  [phone]  Name: George [name] MRN: [id number] Date of Birth: 1965-07-11

## 2021-01-22 ENCOUNTER — Other Ambulatory Visit: Payer: Self-pay

## 2021-01-22 ENCOUNTER — Ambulatory Visit: Payer: No Typology Code available for payment source

## 2021-01-22 DIAGNOSIS — M6281 Muscle weakness (generalized): Secondary | ICD-10-CM

## 2021-01-22 DIAGNOSIS — M25551 Pain in right hip: Secondary | ICD-10-CM | POA: Diagnosis not present

## 2021-01-22 NOTE — Therapy (Signed)
[place], [place], [address] Phone: [phone]   Fax:  [phone]  Physical Therapy Treatment  Patient Details  Name: George [name] MRN: [id number] Date of Birth: 1966-01-17 Referring Provider (PT): Laurin Coder, MD   Encounter Date: 01/22/2021   PT End of Session - 01/22/21 1042     Visit Number 2    Number of Visits 17    Date for PT Re-Evaluation 03/13/21    Authorization Type [employer] - FOTO 6th and 10th    PT Start Time 1045    PT Stop Time 1129   2 min of DN to R piriformis   PT Time Calculation (min) 44 min    Activity Tolerance Patient tolerated treatment well;No increased pain    Behavior During Therapy WFL for tasks assessed/performed             Past Medical History:  Diagnosis Date   Anxiety    Colon polyps    GERD (gastroesophageal reflux disease)    Seasonal allergies     Past Surgical History:  Procedure Laterality Date   COLONOSCOPY     UPPER GASTROINTESTINAL ENDOSCOPY      There were no vitals filed for this visit.   Subjective Assessment - 01/22/21 1043     Subjective Pt presents to PT with no current reports of current R posterior hip pain, has had pain off and on the last few days especially at night, roughly 5/10. Has been compliant with HEP with no adverse effect. Ready to begin PT treatment at this time.    Currently in Pain? No/denies    Pain Score 0-No pain           [facility] Adult PT Treatment/Exercise:   Therapeutic Exercise:  Recumbent bike lvl 3 x 3 min while taking subjective Bridge x 10 - 3 sec hold Single leg bridge 2x10 ea S/L hip abd 2x10 S/L clamshell 2x10 red tband  Manual Therapy: Manual palpation of trigger points to R gluteals and proximal hip Manual stretching of R posterior hip, ERs Trigger point release to R piriformis                    Trigger Point Dry Needling - 01/22/21 0001     Consent Given? Yes     Education Handout Provided No    Muscles Treated Back/Hip Piriformis    Piriformis Response Twitch response elicited                     PT Short Term Goals - 01/16/21 1421       PT SHORT TERM GOAL #1   Title Pt will be compliant with initial HEP for improved comfort and carryover    Baseline initial HEP given    Time 3    Period Weeks    Status New    Target Date 02/06/21               PT Long Term Goals - 01/16/21 1421       PT LONG TERM GOAL #1   Title Pt will improve FOTO function score to predicted value as proxy for functional improvement    Time 8    Period Weeks    Status New    Target Date 03/13/21      PT LONG TERM GOAL #2   Title Pt will self report R hip pain no greater than 2/10 at worst for improved comfort  and function    Baseline 5/10 at wrost    Time 8    Period Weeks    Status New    Target Date 03/13/21      PT LONG TERM GOAL #3   Title Pt will be able to jog at least 30 min without inc of R hip pain for improved functional ability to return to previous active lifestyle    Baseline unable    Time 8    Period Weeks    Status New    Target Date 03/13/21                   Plan - 01/22/21 1110     Clinical Impression Statement Pt was once again able to complete prescribed exercises with no adverse effect or change in baseline. Today's session focused on improving proximal hip muscle strength and decreasing soft tissue irritation. Responded well to DN to R piriformis, local twitch response noted. Will continue to progress exercises as tolerated per POC as prescribed.    PT Treatment/Interventions ADLs/Self Care Home Management;Electrical Stimulation;Cryotherapy;Moist Heat;Gait training;Stair training;Functional mobility training;Therapeutic activities;Therapeutic exercise;Balance training;Neuromuscular re-education;Patient/family education;Manual techniques;Dry needling;Passive range of motion;Vasopneumatic Device;Taping;Joint  Manipulations;Spinal Manipulations    PT Next Visit Plan assess response to HEP; progress hip strengthening as able; DN R piriformis    PT Home Exercise Plan Access Code: [facility]             Patient will benefit from skilled therapeutic intervention in order to improve the following deficits and impairments:  Decreased activity tolerance, Decreased mobility, Decreased strength, Pain, Impaired flexibility  Visit Diagnosis: Muscle weakness (generalized)  Pain in right hip     Problem List Patient Active Problem List   Diagnosis Date Noted   Whiplash 12/09/2020   Foot contusion 12/09/2020   Right knee pain 06/04/2020   Mild sleep apnea 05/27/2020   GAD (generalized anxiety disorder) 03/17/2020   Major depressive disorder with single episode, in full remission ([place]) 03/17/2020   Hamstring tightness of right lower extremity 11/02/2019   Hyperlipidemia, unspecified 07/03/2019   Callus of foot 05/17/2019   Vitamin D deficiency 03/21/2018   B12 deficiency 03/21/2018   Poor posture 12/22/2017   Capsulitis of metatarsophalangeal (MTP) joint of left foot 12/22/2017   Nonallopathic lesion of cervical region 10/28/2017   Nonallopathic lesion of rib cage 10/28/2017   Tibialis posterior tendinitis, right 08/30/2017   Extensor intersection syndrome of right wrist 02/24/2017   ECU (extensor carpi ulnaris), subluxation/dislocation, right, initial encounter 07/19/2016   Lumbar radiculopathy 08/14/2015   Nonallopathic lesion of thoracic region 05/19/2015   Nonallopathic lesion of lumbosacral region 05/19/2015   Nonallopathic lesion of sacral region 05/19/2015   Piriformis syndrome of right side 04/24/2015   Allergic rhinitis due to pollen 04/23/2015   Extrinsic asthma 08/11/2010   Insomnia 08/11/2010   GERD 12/08/2007   Irritable bowel syndrome 12/08/2007    Ward Chatters, PT 01/22/2021, 1:25 PM  [facility] [address] [place], [place], [address] Phone: [phone]   Fax:  [phone]  Name: Hobart Marte MRN: [id number] Date of Birth: 1966-03-12

## 2021-01-22 NOTE — Addendum Note (Signed)
Addended by: Ward Chatters on: 01/22/2021 04:21 PM   Modules accepted: Orders

## 2021-01-26 NOTE — Progress Notes (Signed)
George [name] D.[place] Phone: [phone]   Assessment and Plan:     1. Right hip pain 2. Piriformis syndrome of right side 3. Somatic dysfunction of thoracic region 4. Somatic dysfunction of lumbar region 5. Somatic dysfunction of pelvic region -Chronic with exacerbation, subsequent visit - Generalized right hip pain with primary pain generator appearing to be piriformis based on HPI and physical exam - Continue physical therapy - Continue NSAIDs/Tylenol as needed for pain control - Patient has received significant relief with OMT in the past.  Elects for repeat OMT today.  Tolerated well per note below. - Decision today to treat with OMT was based on Physical Exam   After verbal consent patient was treated with HVLA (high velocity low amplitude), ME (muscle energy), FPR (flex positional release), ST (soft tissue), PC/PD (Pelvic Compression/ Pelvic Decompression) techniques in cervical, rib, thoracic, lumbar, and pelvic areas. Patient tolerated the procedure well with improvement in symptoms.  Patient educated on potential side effects of soreness and recommended to rest, hydrate, and use Tylenol as needed for pain control.   Pertinent previous records reviewed include none   Follow Up: 4 weeks for repeat OMT   Subjective:   I, George [name], am serving as a scribe for Dr. Glennon Mac  Chief Complaint: Right hip and back pain   HPI:   01/06/21 Patient is a 55 year old male presenting with hip pain. Patient was last seen by Dr. Tamala Julian on 12/09/20 and had OMT. Today patient states that the pain is in he R hip/IT band down the right leg that hurts. Patient does like to run, but has not been able to and is looking to figure out anything else that he can do. Patient does the HEP, OMT, ICE, and he would like to see if PT or an injection can help more with the pain.   01/27/2021 Patient states right hip is very sore  and tight and the left hip is starting to get the same way.  Relevant Historical Information: None pertinent  Additional pertinent review of systems negative.  Current Outpatient Medications  Medication Sig Dispense Refill   albuterol (VENTOLIN HFA) 108 (90 Base) MCG/ACT inhaler Inhale 2 puffs into the lungs every 6 (six) hours as needed for wheezing or shortness of breath. 18 g 2   budesonide-formoterol (SYMBICORT) 160-4.5 MCG/ACT inhaler Inhale 2 puffs into the lungs 2 (two) times daily. 3 each 3   cetirizine (ZYRTEC) 10 MG tablet Take 10 mg by mouth at bedtime.     dexlansoprazole (DEXILANT) 60 MG capsule Take 1 capsule (60 mg total) by mouth daily. 90 capsule 3   escitalopram (LEXAPRO) 20 MG tablet Take 1 tablet (20 mg total) by mouth daily. 90 tablet 3   fluticasone (FLONASE) 50 MCG/ACT nasal spray Place 2 sprays into both nostrils daily. 16 g 11   gabapentin (NEURONTIN) 100 MG capsule Take 2 capsules (200 mg total) by mouth at bedtime. 180 capsule 1   ketoconazole (NIZORAL) 2 % cream Apply topically daily.     Melatonin 5 MG TABS Take by mouth.     rosuvastatin (CRESTOR) 10 MG tablet Take 1 tablet (10 mg total) by mouth daily. 90 tablet 3   No current facility-administered medications for this visit.      Objective:     Vitals:   01/27/21 0755  BP: 94/70  Pulse: 61  SpO2: 99%  Weight: 180 lb (81.6 kg)  Height: 5\' 8"  (1.727 m)      Body mass index is 27.37 kg/m.    Physical Exam:     General: Well-appearing, cooperative, sitting comfortably in no acute distress.   OMT Physical Exam:  ASIS Compression Test: Positive Right Thoracic: Mild TTP paraspinal, T7-11 RRSL Lumbar: Mild TTP paraspinal, L1-3 RRSL Pelvis: Right anterior innominate Positive piriformis test bilateral, more prominent on right compared to left  Electronically signed by:  George [name] D.[facility] 8:18 AM 01/27/21

## 2021-01-27 ENCOUNTER — Other Ambulatory Visit: Payer: Self-pay

## 2021-01-27 ENCOUNTER — Ambulatory Visit: Payer: No Typology Code available for payment source | Admitting: Sports Medicine

## 2021-01-27 VITALS — BP 94/70 | HR 61 | Ht 68.0 in | Wt 180.0 lb

## 2021-01-27 DIAGNOSIS — G5701 Lesion of sciatic nerve, right lower limb: Secondary | ICD-10-CM | POA: Diagnosis not present

## 2021-01-27 DIAGNOSIS — M9905 Segmental and somatic dysfunction of pelvic region: Secondary | ICD-10-CM

## 2021-01-27 DIAGNOSIS — M25551 Pain in right hip: Secondary | ICD-10-CM

## 2021-01-27 DIAGNOSIS — M9902 Segmental and somatic dysfunction of thoracic region: Secondary | ICD-10-CM | POA: Diagnosis not present

## 2021-01-27 DIAGNOSIS — M9903 Segmental and somatic dysfunction of lumbar region: Secondary | ICD-10-CM

## 2021-01-27 NOTE — Patient Instructions (Addendum)
Good to see you  Follow up in 1 month

## 2021-02-03 ENCOUNTER — Ambulatory Visit: Payer: No Typology Code available for payment source

## 2021-02-03 ENCOUNTER — Other Ambulatory Visit: Payer: Self-pay

## 2021-02-03 DIAGNOSIS — M25551 Pain in right hip: Secondary | ICD-10-CM

## 2021-02-03 DIAGNOSIS — M6281 Muscle weakness (generalized): Secondary | ICD-10-CM

## 2021-02-03 NOTE — Therapy (Signed)
[place], [place], [address] Phone: [phone]   Fax:  [phone]  Physical Therapy Treatment  Patient Details  Name: George [name] MRN: [id number] Date of Birth: January 24, 1966 Referring Provider (PT): Laurin Coder, MD   Encounter Date: 02/03/2021   PT End of Session - 02/03/21 1619     Visit Number 3    Number of Visits 17    Date for PT Re-Evaluation 03/13/21    Authorization Type [employer] - FOTO 6th and 10th    PT Start Time 1617    PT Stop Time 0102    PT Time Calculation (min) 41 min    Activity Tolerance Patient tolerated treatment well;No increased pain    Behavior During Therapy WFL for tasks assessed/performed             Past Medical History:  Diagnosis Date   Anxiety    Colon polyps    GERD (gastroesophageal reflux disease)    Seasonal allergies     Past Surgical History:  Procedure Laterality Date   COLONOSCOPY     UPPER GASTROINTESTINAL ENDOSCOPY      There were no vitals filed for this visit.   Subjective Assessment - 02/03/21 1620     Subjective Pt presents to PT with reports of continued R posterior hip pain over the last week, with increases up to 5/10 over the last few days. Pt has been compliant with HEP with no adverse effect. Is ready to begin PT treatment at this time,    Currently in Pain? Yes    Pain Score 2     Pain Location Hip    Pain Orientation Right;Posterior           [facility] Adult PT Treatment/Exercise:   Therapeutic Exercise:  Recumbent bike lvl 3 x 4 min while taking subjective Single leg bridge 2x10 ea S/L clamshell 2x10 green tband Standing hip abd 2x10 30lbs Standing hip ext 2x10 30lbs KB deadlift to 8in box 15lb 2x10  Manual Therapy: R cervical side glides grade IV   Neuromuscular re-ed: N/A   Therapeutic Activity: N/A   Modalities: N/A   Self Care: N/A   Consider / progression for next session:                                 PT Short Term Goals - 01/16/21 1421       PT SHORT TERM GOAL #1   Title Pt will be compliant with initial HEP for improved comfort and carryover    Baseline initial HEP given    Time 3    Period Weeks    Status New    Target Date 02/06/21               PT Long Term Goals - 01/16/21 1421       PT LONG TERM GOAL #1   Title Pt will improve FOTO function score to predicted value as proxy for functional improvement    Time 8    Period Weeks    Status New    Target Date 03/13/21      PT LONG TERM GOAL #2   Title Pt will self report R hip pain no greater than 2/10 at worst for improved comfort and function    Baseline 5/10 at wrost    Time 8    Period Weeks    Status New  Target Date 03/13/21      PT LONG TERM GOAL #3   Title Pt will be able to jog at least 30 min without inc of R hip pain for improved functional ability to return to previous active lifestyle    Baseline unable    Time 8    Period Weeks    Status New    Target Date 03/13/21                   Plan - 02/03/21 1826     Clinical Impression Statement Pt responded well to PT, noting decrease in R posterior soft tissue pain post session. Therapy today focused on improving posterior chain strength and posterior hip muscle length in order to decrease pain and improve function. He continues to benefit from skilled PT services working on decreasing pain and will continue to be seen and progressed as tolerated.    PT Treatment/Interventions ADLs/Self Care Home Management;Electrical Stimulation;Cryotherapy;Moist Heat;Gait training;Stair training;Functional mobility training;Therapeutic activities;Therapeutic exercise;Balance training;Neuromuscular re-education;Patient/family education;Manual techniques;Dry needling;Passive range of motion;Vasopneumatic Device;Taping;Joint Manipulations;Spinal Manipulations    PT Next Visit Plan assess response to  HEP; progress hip strengthening as able; DN R piriformis    PT Home Exercise Plan Access Code: [facility]             Patient will benefit from skilled therapeutic intervention in order to improve the following deficits and impairments:  Decreased activity tolerance, Decreased mobility, Decreased strength, Pain, Impaired flexibility  Visit Diagnosis: Muscle weakness (generalized)  Pain in right hip     Problem List Patient Active Problem List   Diagnosis Date Noted   Whiplash 12/09/2020   Foot contusion 12/09/2020   Right knee pain 06/04/2020   Mild sleep apnea 05/27/2020   GAD (generalized anxiety disorder) 03/17/2020   Major depressive disorder with single episode, in full remission ([place]) 03/17/2020   Hamstring tightness of right lower extremity 11/02/2019   Hyperlipidemia, unspecified 07/03/2019   Callus of foot 05/17/2019   Vitamin D deficiency 03/21/2018   B12 deficiency 03/21/2018   Poor posture 12/22/2017   Capsulitis of metatarsophalangeal (MTP) joint of left foot 12/22/2017   Nonallopathic lesion of cervical region 10/28/2017   Nonallopathic lesion of rib cage 10/28/2017   Tibialis posterior tendinitis, right 08/30/2017   Extensor intersection syndrome of right wrist 02/24/2017   ECU (extensor carpi ulnaris), subluxation/dislocation, right, initial encounter 07/19/2016   Lumbar radiculopathy 08/14/2015   Nonallopathic lesion of thoracic region 05/19/2015   Nonallopathic lesion of lumbosacral region 05/19/2015   Nonallopathic lesion of sacral region 05/19/2015   Piriformis syndrome of right side 04/24/2015   Allergic rhinitis due to pollen 04/23/2015   Extrinsic asthma 08/11/2010   Insomnia 08/11/2010   GERD 12/08/2007   Irritable bowel syndrome 12/08/2007    Ward Chatters, PT 02/03/2021, 6:28 PM  [facility] [address] [place], [place], [address] Phone: [phone]   Fax:  [phone]  Name: George [name] MRN: [id number] Date of Birth: January 09, 1966

## 2021-02-11 ENCOUNTER — Other Ambulatory Visit: Payer: Self-pay

## 2021-02-11 ENCOUNTER — Ambulatory Visit: Payer: No Typology Code available for payment source

## 2021-02-11 DIAGNOSIS — M6281 Muscle weakness (generalized): Secondary | ICD-10-CM

## 2021-02-11 DIAGNOSIS — M25551 Pain in right hip: Secondary | ICD-10-CM

## 2021-02-11 NOTE — Therapy (Signed)
[place], [place], [address] Phone: [phone]   Fax:  [phone]  Physical Therapy Treatment  Patient Details  Name: George [name] MRN: [id number] Date of Birth: [birth year] Referring Provider (PT): Laurin Coder, MD   Encounter Date: 02/11/2021   PT End of Session - 02/11/21 1621     Visit Number 4    Number of Visits 17    Date for PT Re-Evaluation 03/13/21    Authorization Type [employer] - FOTO 6th and 10th    PT Start Time 1626   6 min taken from start for TPDN   PT Stop Time 1707    PT Time Calculation (min) 41 min    Activity Tolerance Patient tolerated treatment well;No increased pain    Behavior During Therapy WFL for tasks assessed/performed             Past Medical History:  Diagnosis Date   Anxiety    Colon polyps    GERD (gastroesophageal reflux disease)    Seasonal allergies     Past Surgical History:  Procedure Laterality Date   COLONOSCOPY     UPPER GASTROINTESTINAL ENDOSCOPY      There were no vitals filed for this visit.   Subjective Assessment - 02/11/21 1621     Subjective Pt presents to PT with no current reports of R hip pain. Has had return of R sided neck pain after driving home from [place]. Has been compliant with HEP with no adverse effect. Is ready to begin PT treatment at this time.    Currently in Pain? No/denies    Pain Score 0-No pain   5/10 R neck          [facility] Adult PT Treatment/Exercise:   Therapeutic Exercise:  Elliptical lvl 3 x 3 min while taking subjective Single leg bridge 3x10 ea Prone hip ext 2x10 S/L clamshell 2x15 green tband - NT Standing hip abd 2x10 30lbs Standing hip ext 2x15 30lbs KB deadlift to 8in box 45lb 3x8   Manual Therapy: R cervical side glides grade IV Positional release to R upper trap Manual cervical traction   Neuromuscular re-ed: N/A   Therapeutic Activity: N/A   Modalities: N/A   Self  Care: N/A   Consider / progression for next session:                       Trigger Point Dry Needling - 02/11/21 0001     Consent Given? Yes    Muscles Treated Back/Hip Piriformis    Piriformis Response Palpable increased muscle length                     PT Short Term Goals - 01/16/21 1421       PT SHORT TERM GOAL #1   Title Pt will be compliant with initial HEP for improved comfort and carryover    Baseline initial HEP given    Time 3    Period Weeks    Status New    Target Date 02/06/21               PT Long Term Goals - 01/16/21 1421       PT LONG TERM GOAL #1   Title Pt will improve FOTO function score to predicted value as proxy for functional improvement    Time 8    Period Weeks    Status New    Target Date 03/13/21  PT LONG TERM GOAL #2   Title Pt will self report R hip pain no greater than 2/10 at worst for improved comfort and function    Baseline 5/10 at [facility]    Time 8    Period Weeks    Status New    Target Date 03/13/21      PT LONG TERM GOAL #3   Title Pt will be able to jog at least 30 min without inc of R hip pain for improved functional ability to return to previous active lifestyle    Baseline unable    Time 8    Period Weeks    Status New    Target Date 03/13/21                   Plan - 02/11/21 1713     Clinical Impression Statement Pt was able to complete prescribed exercises and responded well to manual therpay intervetions to cervical spine and DN interventions to R piriformis. Notable decrease in tone during palpation and TPDN noted in R piriformis. Therapy today also focused on improving proximal hip strength, with pt demonstrating improving posterior chain strength and activity tolerance. He continues to benefit from skilled PT services and will continue to be seen and progressed as able.    PT Treatment/Interventions ADLs/Self Care Home Management;Electrical  Stimulation;Cryotherapy;Moist Heat;Gait training;Stair training;Functional mobility training;Therapeutic activities;Therapeutic exercise;Balance training;Neuromuscular re-education;Patient/family education;Manual techniques;Dry needling;Passive range of motion;Vasopneumatic Device;Taping;Joint Manipulations;Spinal Manipulations    PT Next Visit Plan assess response to HEP; progress hip strengthening as able; DN R piriformis    PT Home Exercise Plan Access Code: [facility]             Patient will benefit from skilled therapeutic intervention in order to improve the following deficits and impairments:  Decreased activity tolerance, Decreased mobility, Decreased strength, Pain, Impaired flexibility  Visit Diagnosis: Muscle weakness (generalized)  Pain in right hip     Problem List Patient Active Problem List   Diagnosis Date Noted   Whiplash 12/09/2020   Foot contusion 12/09/2020   Right knee pain 06/04/2020   Mild sleep apnea 05/27/2020   GAD (generalized anxiety disorder) 03/17/2020   Major depressive disorder with single episode, in full remission ([place]) 03/17/2020   Hamstring tightness of right lower extremity 11/02/2019   Hyperlipidemia, unspecified 07/03/2019   Callus of foot 05/17/2019   Vitamin D deficiency 03/21/2018   B12 deficiency 03/21/2018   Poor posture 12/22/2017   Capsulitis of metatarsophalangeal (MTP) joint of left foot 12/22/2017   Nonallopathic lesion of cervical region 10/28/2017   Nonallopathic lesion of rib cage 10/28/2017   Tibialis posterior tendinitis, right 08/30/2017   Extensor intersection syndrome of right wrist 02/24/2017   ECU (extensor carpi ulnaris), subluxation/dislocation, right, initial encounter 07/19/2016   Lumbar radiculopathy 08/14/2015   Nonallopathic lesion of thoracic region 05/19/2015   Nonallopathic lesion of lumbosacral region 05/19/2015   Nonallopathic lesion of sacral region 05/19/2015   Piriformis syndrome of right side  04/24/2015   Allergic rhinitis due to pollen 04/23/2015   Extrinsic asthma 08/11/2010   Insomnia 08/11/2010   GERD 12/08/2007   Irritable bowel syndrome 12/08/2007    Ward Chatters, PT 02/11/2021, 5:16 PM  [place] [facility] [address] [place], [place], [address] Phone: [phone]   Fax:  [phone]  Name: Berwyn Bigley MRN: [id number] Date of Birth: [birth year]

## 2021-02-16 ENCOUNTER — Other Ambulatory Visit: Payer: Self-pay

## 2021-02-16 ENCOUNTER — Ambulatory Visit: Payer: No Typology Code available for payment source | Attending: Sports Medicine

## 2021-02-16 DIAGNOSIS — M6281 Muscle weakness (generalized): Secondary | ICD-10-CM | POA: Insufficient documentation

## 2021-02-16 DIAGNOSIS — M25551 Pain in right hip: Secondary | ICD-10-CM | POA: Diagnosis present

## 2021-02-16 NOTE — Therapy (Signed)
[place], [place], [address] Phone: [phone]   Fax:  [phone]  Physical Therapy Treatment  Patient Details  Name: George [name] MRN: [id number] Date of Birth: 01/11/1966 Referring Provider (PT): Laurin Coder, MD   Encounter Date: 02/16/2021   PT End of Session - 02/16/21 1621     Visit Number 5    Number of Visits 17    Date for PT Re-Evaluation 03/13/21    Authorization Type [employer] - FOTO 6th and 10th    PT Start Time 1620    PT Stop Time 1703    PT Time Calculation (min) 43 min    Activity Tolerance Patient tolerated treatment well;No increased pain    Behavior During Therapy WFL for tasks assessed/performed             Past Medical History:  Diagnosis Date   Anxiety    Colon polyps    GERD (gastroesophageal reflux disease)    Seasonal allergies     Past Surgical History:  Procedure Laterality Date   COLONOSCOPY     UPPER GASTROINTESTINAL ENDOSCOPY      There were no vitals filed for this visit.   Subjective Assessment - 02/16/21 1621     Subjective Pt presents to PT with no current R hip pain and believes this is feeling much better. Has been compliant with his HEP with no adverse effect. He is ready to begin PT treatment at this time.    Currently in Pain? No/denies    Pain Score 0-No pain           [facility] Adult PT Treatment/Exercise:   Therapeutic Exercise: Exercise bike lvl 4 x 3 min while taking subjective TRX squat 3x10 Lateral walk RTB 3x11ft Monster walk x 56ft RTV Standing hip abd 2x10 30lbs Standing hip ext 2x10 30lbs Single leg deadlift 10lb 3x8 ea KB deadlift to 8in box 45lb 3x8 S/L hip abd circles x 10 cw/ccw ea   Past Interventions Not Performed Today: Single leg bridge 3x10 ea Prone hip ext 2x10 S/L clamshell 2x15 green tband - NT  Manual Therapy: R cervical side glides grade IV Positional release to R upper trap Manual cervical traction    Neuromuscular re-ed: N/A   Therapeutic Activity: N/A   Modalities: N/A   Self Care: N/A   Consider / progression for next session:                               PT Short Term Goals - 01/16/21 1421       PT SHORT TERM GOAL #1   Title Pt will be compliant with initial HEP for improved comfort and carryover    Baseline initial HEP given    Time 3    Period Weeks    Status New    Target Date 02/06/21               PT Long Term Goals - 01/16/21 1421       PT LONG TERM GOAL #1   Title Pt will improve FOTO function score to predicted value as proxy for functional improvement    Time 8    Period Weeks    Status New    Target Date 03/13/21      PT LONG TERM GOAL #2   Title Pt will self report R hip pain no greater than 2/10 at worst for improved comfort  and function    Baseline 5/10 at [facility]    Time 8    Period Weeks    Status New    Target Date 03/13/21      PT LONG TERM GOAL #3   Title Pt will be able to jog at least 30 min without inc of R hip pain for improved functional ability to return to previous active lifestyle    Baseline unable    Time 8    Period Weeks    Status New    Target Date 03/13/21                   Plan - 02/16/21 1623     Clinical Impression Statement Pt was able to complete all prescribed exercises with no adverse effect or increase in pain. Continues to progress very well with therapy, with today's session continuing to progress posterior chain strengthening along with reducing neck pain and discomfort. PT will continue to progress exercises as tolerated per POC as prescribed.    PT Treatment/Interventions ADLs/Self Care Home Management;Electrical Stimulation;Cryotherapy;Moist Heat;Gait training;Stair training;Functional mobility training;Therapeutic activities;Therapeutic exercise;Balance training;Neuromuscular re-education;Patient/family education;Manual techniques;Dry needling;Passive range of  motion;Vasopneumatic Device;Taping;Joint Manipulations;Spinal Manipulations    PT Next Visit Plan assess response to HEP; progress hip strengthening as able; DN R piriformis    PT Home Exercise Plan Access Code: [facility]             Patient will benefit from skilled therapeutic intervention in order to improve the following deficits and impairments:  Decreased activity tolerance, Decreased mobility, Decreased strength, Pain, Impaired flexibility  Visit Diagnosis: Muscle weakness (generalized)  Pain in right hip     Problem List Patient Active Problem List   Diagnosis Date Noted   Whiplash 12/09/2020   Foot contusion 12/09/2020   Right knee pain 06/04/2020   Mild sleep apnea 05/27/2020   GAD (generalized anxiety disorder) 03/17/2020   Major depressive disorder with single episode, in full remission ([place]) 03/17/2020   Hamstring tightness of right lower extremity 11/02/2019   Hyperlipidemia, unspecified 07/03/2019   Callus of foot 05/17/2019   Vitamin D deficiency 03/21/2018   B12 deficiency 03/21/2018   Poor posture 12/22/2017   Capsulitis of metatarsophalangeal (MTP) joint of left foot 12/22/2017   Nonallopathic lesion of cervical region 10/28/2017   Nonallopathic lesion of rib cage 10/28/2017   Tibialis posterior tendinitis, right 08/30/2017   Extensor intersection syndrome of right wrist 02/24/2017   ECU (extensor carpi ulnaris), subluxation/dislocation, right, initial encounter 07/19/2016   Lumbar radiculopathy 08/14/2015   Nonallopathic lesion of thoracic region 05/19/2015   Nonallopathic lesion of lumbosacral region 05/19/2015   Nonallopathic lesion of sacral region 05/19/2015   Piriformis syndrome of right side 04/24/2015   Allergic rhinitis due to pollen 04/23/2015   Extrinsic asthma 08/11/2010   Insomnia 08/11/2010   GERD 12/08/2007   Irritable bowel syndrome 12/08/2007    Ward Chatters, PT 02/16/2021, 5:23 PM  [place]  [facility] [address] [place], [place], [address] Phone: [phone]   Fax:  [phone]  Name: Isayah Ignasiak MRN: [id number] Date of Birth: 1965-11-21

## 2021-02-18 ENCOUNTER — Ambulatory Visit: Payer: No Typology Code available for payment source

## 2021-02-18 ENCOUNTER — Other Ambulatory Visit: Payer: Self-pay

## 2021-02-18 DIAGNOSIS — M6281 Muscle weakness (generalized): Secondary | ICD-10-CM

## 2021-02-18 DIAGNOSIS — M25551 Pain in right hip: Secondary | ICD-10-CM

## 2021-02-18 NOTE — Therapy (Signed)
[place], [place], [address] Phone: [phone]   Fax:  [phone]  Physical Therapy Treatment  Patient Details  Name: George [name] MRN: [id number] Date of Birth: [birth year] Referring Provider (PT): Laurin Coder, MD   Encounter Date: 02/18/2021   PT End of Session - 02/18/21 1614     Visit Number 6    Number of Visits 17    Date for PT Re-Evaluation 03/13/21    Authorization Type [employer] - FOTO 6th and 10th    PT Start Time 1615    PT Stop Time 1655   5 min TPDN   PT Time Calculation (min) 40 min    Activity Tolerance Patient tolerated treatment well;No increased pain    Behavior During Therapy WFL for tasks assessed/performed             Past Medical History:  Diagnosis Date   Anxiety    Colon polyps    GERD (gastroesophageal reflux disease)    Seasonal allergies     Past Surgical History:  Procedure Laterality Date   COLONOSCOPY     UPPER GASTROINTESTINAL ENDOSCOPY      There were no vitals filed for this visit.   Subjective Assessment - 02/18/21 1615     Subjective Pt presents to PT with no current hip pain or discomfort. Notes he is generally feeling much better overall, decreasing discomfort overall. Pt is ready to begin PT treatment at this time.    Currently in Pain? No/denies    Pain Score 0-No pain           [facility] Adult PT Treatment/Exercise:   Therapeutic Exercise: Exercise bike lvl 4 x 3 min while taking subjective TRX squat x10 TRX single leg squat 2x10 ea Lateral walk RTB 3x22ft Monster walk x 35ft RTB Standing hip abd 2x10 30lbs Standing hip ext 2x10 30lbs Single leg deadlift 10lb 3x8 ea KB deadlift to 8in box 45lb 3x8 S/L hip abd circles x 10 cw/ccw ea   Past Interventions Not Performed Today: Single leg bridge 3x10 ea Prone hip ext 2x10 S/L clamshell 2x15 green tband - NT   Manual Therapy: R cervical side glides grade IV Positional release to R  upper trap Manual cervical traction   Neuromuscular re-ed: N/A   Therapeutic Activity: N/A   Modalities: N/A   Self Care: N/A   Consider / progression for next session:                      Trigger Point Dry Needling - 02/19/21 0001     Consent Given? Yes    Muscles Treated Head and Neck Upper trapezius    Piriformis Response Palpable increased muscle length                     PT Short Term Goals - 01/16/21 1421       PT SHORT TERM GOAL #1   Title Pt will be compliant with initial HEP for improved comfort and carryover    Baseline initial HEP given    Time 3    Period Weeks    Status New    Target Date 02/06/21               PT Long Term Goals - 01/16/21 1421       PT LONG TERM GOAL #1   Title Pt will improve FOTO function score to predicted value as proxy for functional improvement  Time 8    Period Weeks    Status New    Target Date 03/13/21      PT LONG TERM GOAL #2   Title Pt will self report R hip pain no greater than 2/10 at worst for improved comfort and function    Baseline 5/10 at [facility]    Time 8    Period Weeks    Status New    Target Date 03/13/21      PT LONG TERM GOAL #3   Title Pt will be able to jog at least 30 min without inc of R hip pain for improved functional ability to return to previous active lifestyle    Baseline unable    Time 8    Period Weeks    Status New    Target Date 03/13/21                   Plan - 02/18/21 1636     Clinical Impression Statement Pt was able to complete prescribed exercises with no adverse effect or increase in pain. Therapy today focused on improving proximal hip strength and functional mobility in order to decrease R hip pain. He responded well to TPDN with no adverse effect to R upper trapezius. Pt is overall progressing very well with therapy and should continue to be seen and progressed as tolerated.    PT Treatment/Interventions ADLs/Self Care Home  Management;Electrical Stimulation;Cryotherapy;Moist Heat;Gait training;Stair training;Functional mobility training;Therapeutic activities;Therapeutic exercise;Balance training;Neuromuscular re-education;Patient/family education;Manual techniques;Dry needling;Passive range of motion;Vasopneumatic Device;Taping;Joint Manipulations;Spinal Manipulations    PT Next Visit Plan assess response to HEP; progress hip strengthening as able; DN R piriformis    PT Home Exercise Plan Access Code: [facility]             Patient will benefit from skilled therapeutic intervention in order to improve the following deficits and impairments:  Decreased activity tolerance, Decreased mobility, Decreased strength, Pain, Impaired flexibility  Visit Diagnosis: Muscle weakness (generalized)  Pain in right hip     Problem List Patient Active Problem List   Diagnosis Date Noted   Whiplash 12/09/2020   Foot contusion 12/09/2020   Right knee pain 06/04/2020   Mild sleep apnea 05/27/2020   GAD (generalized anxiety disorder) 03/17/2020   Major depressive disorder with single episode, in full remission ([place]) 03/17/2020   Hamstring tightness of right lower extremity 11/02/2019   Hyperlipidemia, unspecified 07/03/2019   Callus of foot 05/17/2019   Vitamin D deficiency 03/21/2018   B12 deficiency 03/21/2018   Poor posture 12/22/2017   Capsulitis of metatarsophalangeal (MTP) joint of left foot 12/22/2017   Nonallopathic lesion of cervical region 10/28/2017   Nonallopathic lesion of rib cage 10/28/2017   Tibialis posterior tendinitis, right 08/30/2017   Extensor intersection syndrome of right wrist 02/24/2017   ECU (extensor carpi ulnaris), subluxation/dislocation, right, initial encounter 07/19/2016   Lumbar radiculopathy 08/14/2015   Nonallopathic lesion of thoracic region 05/19/2015   Nonallopathic lesion of lumbosacral region 05/19/2015   Nonallopathic lesion of sacral region 05/19/2015   Piriformis  syndrome of right side 04/24/2015   Allergic rhinitis due to pollen 04/23/2015   Extrinsic asthma 08/11/2010   Insomnia 08/11/2010   GERD 12/08/2007   Irritable bowel syndrome 12/08/2007    Ward Chatters, PT 02/19/2021, 9:16 AM  [facility] [address] [place], [place], [address] Phone: [phone]   Fax:  [phone]  Name: George [name] MRN: [id number] Date of Birth: 08-13-1965

## 2021-02-23 ENCOUNTER — Other Ambulatory Visit: Payer: Self-pay

## 2021-02-23 ENCOUNTER — Ambulatory Visit: Payer: No Typology Code available for payment source

## 2021-02-23 ENCOUNTER — Ambulatory Visit: Payer: No Typology Code available for payment source | Admitting: Sports Medicine

## 2021-02-23 DIAGNOSIS — M6281 Muscle weakness (generalized): Secondary | ICD-10-CM

## 2021-02-23 DIAGNOSIS — M25551 Pain in right hip: Secondary | ICD-10-CM

## 2021-02-23 NOTE — Therapy (Signed)
[place], [place], [address] Phone: [phone]   Fax:  [phone]  Physical Therapy Treatment  Patient Details  Name: George [name] MRN: [id number] Date of Birth: 09/28/1965 Referring Provider (PT): Laurin Coder, MD   Encounter Date: 02/23/2021   PT End of Session - 02/23/21 1616     Visit Number 7    Number of Visits 17    Date for PT Re-Evaluation 03/13/21    Authorization Type [employer] - FOTO 6th and 10th    PT Start Time 1616    PT Stop Time 1700    PT Time Calculation (min) 44 min    Activity Tolerance Patient tolerated treatment well;No increased pain    Behavior During Therapy WFL for tasks assessed/performed             Past Medical History:  Diagnosis Date   Anxiety    Colon polyps    GERD (gastroesophageal reflux disease)    Seasonal allergies     Past Surgical History:  Procedure Laterality Date   COLONOSCOPY     UPPER GASTROINTESTINAL ENDOSCOPY      There were no vitals filed for this visit.   Subjective Assessment - 02/23/21 1616     Subjective Pt presents to PT with no current hip pain and noting his hip continues to feel very well. Has been compliant with HEP with no adverse effect. Ready to begin PT at this time.    Currently in Pain? No/denies    Pain Score 0-No pain           [facility] Adult PT Treatment/Exercise:   Therapeutic Exercise: Exercise bike lvl 4 x 2.5 min while taking subjective TRX squat x 10 TRX single leg squat 2x10 ea Lateral walk Black TB 3x79ft Lunges 2x100ft Standing hip abd 2x10 30lbs Standing hip ext 2x10 30lbs Single leg deadlift 10lb 3x8 ea KB deadlift to 8in box 75lb 3x8 S/L hip abd circles x 10 cw/ccw ea 2lb   Past Interventions Not Performed Today: Single leg bridge 3x10 ea Prone hip ext 2x10 S/L clamshell 2x15 green tband - NT Standing hip ext 2x10 30lbs Standing hip ext 2x10 30lbs   Manual Therapy: R cervical side  glides grade IV Positional release to R upper trap Manual cervical traction   Neuromuscular re-ed: N/A   Therapeutic Activity: N/A   Modalities: N/A   Self Care: N/A   Consider / progression for next session:                               PT Short Term Goals - 01/16/21 1421       PT SHORT TERM GOAL #1   Title Pt will be compliant with initial HEP for improved comfort and carryover    Baseline initial HEP given    Time 3    Period Weeks    Status New    Target Date 02/06/21               PT Long Term Goals - 01/16/21 1421       PT LONG TERM GOAL #1   Title Pt will improve FOTO function score to predicted value as proxy for functional improvement    Time 8    Period Weeks    Status New    Target Date 03/13/21      PT LONG TERM GOAL #2   Title Pt will  self report R hip pain no greater than 2/10 at worst for improved comfort and function    Baseline 5/10 at [facility]    Time 8    Period Weeks    Status New    Target Date 03/13/21      PT LONG TERM GOAL #3   Title Pt will be able to jog at least 30 min without inc of R hip pain for improved functional ability to return to previous active lifestyle    Baseline unable    Time 8    Period Weeks    Status New    Target Date 03/13/21                   Plan - 02/23/21 1630     Clinical Impression Statement Pt was able to complete prescribed exercises and demonstrated improving proximal hip strength throughout session. Therapy again focused on increasing hip strength and functional mobility, along with manual therapy for decreasing neck pain and discomfort. Overall he is doing very well, noting continued improvement in status and decreased pain. PT will continue to progress as tolerated per POC.    PT Treatment/Interventions ADLs/Self Care Home Management;Electrical Stimulation;Cryotherapy;Moist Heat;Gait training;Stair training;Functional mobility training;Therapeutic  activities;Therapeutic exercise;Balance training;Neuromuscular re-education;Patient/family education;Manual techniques;Dry needling;Passive range of motion;Vasopneumatic Device;Taping;Joint Manipulations;Spinal Manipulations    PT Next Visit Plan assess response to HEP; progress hip strengthening as able; DN R piriformis    PT Home Exercise Plan Access Code: [facility]             Patient will benefit from skilled therapeutic intervention in order to improve the following deficits and impairments:  Decreased activity tolerance, Decreased mobility, Decreased strength, Pain, Impaired flexibility  Visit Diagnosis: Muscle weakness (generalized)  Pain in right hip     Problem List Patient Active Problem List   Diagnosis Date Noted   Whiplash 12/09/2020   Foot contusion 12/09/2020   Right knee pain 06/04/2020   Mild sleep apnea 05/27/2020   GAD (generalized anxiety disorder) 03/17/2020   Major depressive disorder with single episode, in full remission ([place]) 03/17/2020   Hamstring tightness of right lower extremity 11/02/2019   Hyperlipidemia, unspecified 07/03/2019   Callus of foot 05/17/2019   Vitamin D deficiency 03/21/2018   B12 deficiency 03/21/2018   Poor posture 12/22/2017   Capsulitis of metatarsophalangeal (MTP) joint of left foot 12/22/2017   Nonallopathic lesion of cervical region 10/28/2017   Nonallopathic lesion of rib cage 10/28/2017   Tibialis posterior tendinitis, right 08/30/2017   Extensor intersection syndrome of right wrist 02/24/2017   ECU (extensor carpi ulnaris), subluxation/dislocation, right, initial encounter 07/19/2016   Lumbar radiculopathy 08/14/2015   Nonallopathic lesion of thoracic region 05/19/2015   Nonallopathic lesion of lumbosacral region 05/19/2015   Nonallopathic lesion of sacral region 05/19/2015   Piriformis syndrome of right side 04/24/2015   Allergic rhinitis due to pollen 04/23/2015   Extrinsic asthma 08/11/2010   Insomnia 08/11/2010    GERD 12/08/2007   Irritable bowel syndrome 12/08/2007    Ward Chatters, PT 02/24/2021, 12:24 PM  [facility] [address] [place], [place], [address] Phone: [phone]   Fax:  [phone]  Name: George [name] MRN: [id number] Date of Birth: Oct 16, 1965

## 2021-02-25 ENCOUNTER — Ambulatory Visit: Payer: No Typology Code available for payment source

## 2021-02-26 ENCOUNTER — Ambulatory Visit: Payer: No Typology Code available for payment source

## 2021-02-26 ENCOUNTER — Other Ambulatory Visit: Payer: Self-pay

## 2021-02-26 DIAGNOSIS — M6281 Muscle weakness (generalized): Secondary | ICD-10-CM

## 2021-02-26 DIAGNOSIS — M25551 Pain in right hip: Secondary | ICD-10-CM

## 2021-02-26 NOTE — Therapy (Signed)
[place], [place], [address] Phone: [phone]   Fax:  [phone]  Physical Therapy Treatment  Patient Details  Name: George [name] MRN: [id number] Date of Birth: Jul 17, 1965 Referring Provider (PT): Laurin Coder, MD   Encounter Date: 02/26/2021   PT End of Session - 02/26/21 1220     Visit Number 8    Number of Visits 17    Date for PT Re-Evaluation 03/13/21    Authorization Type [employer] - FOTO 6th and 10th    PT Start Time 1221    PT Stop Time 1259    PT Time Calculation (min) 38 min    Activity Tolerance Patient tolerated treatment well;No increased pain    Behavior During Therapy WFL for tasks assessed/performed             Past Medical History:  Diagnosis Date   Anxiety    Colon polyps    GERD (gastroesophageal reflux disease)    Seasonal allergies     Past Surgical History:  Procedure Laterality Date   COLONOSCOPY     UPPER GASTROINTESTINAL ENDOSCOPY      There were no vitals filed for this visit.   Subjective Assessment - 02/26/21 1221     Subjective Pt presents to PT with no current hip pain, but did have some discomfort yesterday which he believes could be due to more sitting activity. Pt is ready to begin PT at this time.    Currently in Pain? No/denies    Pain Score 0-No pain           [facility] Adult PT Treatment/Exercise:   Therapeutic Exercise: Elliptical bike lvl 3 x 3 min while taking subjective TRX single leg squat 2x10 ea Eccentric heel tap 2x8 ea Repeated stationary lunge 2x10 ea Standing hip abd 3x10 30lbs Deadlift 75lb 3x10 S/L hip abd circles x 10 cw/ccw ea 2lb Chin tuck against wall x 10 Seated cervical ext SNAG x 10   Past Interventions Not Performed Today: Single leg bridge 3x10 ea Prone hip ext 2x10 S/L clamshell 2x15 green tband - NT Standing hip ext 2x10 30lbs Standing hip ext 2x10 30lbs Standing hip ext 2x10 30lbs Single leg deadlift  10lb 3x8 ea                             PT Education - 02/26/21 1342     Education Details HEP update    Person(s) Educated Patient    Methods Explanation;Demonstration;Handout    Comprehension Verbalized understanding;Returned demonstration              PT Short Term Goals - 01/16/21 1421       PT SHORT TERM GOAL #1   Title Pt will be compliant with initial HEP for improved comfort and carryover    Baseline initial HEP given    Time 3    Period Weeks    Status New    Target Date 02/06/21               PT Long Term Goals - 01/16/21 1421       PT LONG TERM GOAL #1   Title Pt will improve FOTO function score to predicted value as proxy for functional improvement    Time 8    Period Weeks    Status New    Target Date 03/13/21      PT LONG TERM GOAL #2  Title Pt will self report R hip pain no greater than 2/10 at worst for improved comfort and function    Baseline 5/10 at wrost    Time 8    Period Weeks    Status New    Target Date 03/13/21      PT LONG TERM GOAL #3   Title Pt will be able to jog at least 30 min without inc of R hip pain for improved functional ability to return to previous active lifestyle    Baseline unable    Time 8    Period Weeks    Status New    Target Date 03/13/21                   Plan - 02/26/21 1340     Clinical Impression Statement Pt was again able to complete prescribed exercises with no adverse effect. Therapy continued to progress proximal hip strength today, with progression in difficulty of therapeutic activity handled well by pt. Overall he continues positive progression and will be seen after holiday travel. If symptoms stay on continued improved trajectory, will discharge at that time.    PT Treatment/Interventions ADLs/Self Care Home Management;Electrical Stimulation;Cryotherapy;Moist Heat;Gait training;Stair training;Functional mobility training;Therapeutic activities;Therapeutic  exercise;Balance training;Neuromuscular re-education;Patient/family education;Manual techniques;Dry needling;Passive range of motion;Vasopneumatic Device;Taping;Joint Manipulations;Spinal Manipulations    PT Next Visit Plan assess goals and response to HEP    PT Home Exercise Plan Access Code: [email or web address]    Consulted and Agree with Plan of Care Patient             Patient will benefit from skilled therapeutic intervention in order to improve the following deficits and impairments:  Decreased activity tolerance, Decreased mobility, Decreased strength, Pain, Impaired flexibility  Visit Diagnosis: Muscle weakness (generalized)  Pain in right hip     Problem List Patient Active Problem List   Diagnosis Date Noted   Whiplash 12/09/2020   Foot contusion 12/09/2020   Right knee pain 06/04/2020   Mild sleep apnea 05/27/2020   GAD (generalized anxiety disorder) 03/17/2020   Major depressive disorder with single episode, in full remission ([place]) 03/17/2020   Hamstring tightness of right lower extremity 11/02/2019   Hyperlipidemia, unspecified 07/03/2019   Callus of foot 05/17/2019   Vitamin D deficiency 03/21/2018   B12 deficiency 03/21/2018   Poor posture 12/22/2017   Capsulitis of metatarsophalangeal (MTP) joint of left foot 12/22/2017   Nonallopathic lesion of cervical region 10/28/2017   Nonallopathic lesion of rib cage 10/28/2017   Tibialis posterior tendinitis, right 08/30/2017   Extensor intersection syndrome of right wrist 02/24/2017   ECU (extensor carpi ulnaris), subluxation/dislocation, right, initial encounter 07/19/2016   Lumbar radiculopathy 08/14/2015   Nonallopathic lesion of thoracic region 05/19/2015   Nonallopathic lesion of lumbosacral region 05/19/2015   Nonallopathic lesion of sacral region 05/19/2015   Piriformis syndrome of right side 04/24/2015   Allergic rhinitis due to pollen 04/23/2015   Extrinsic asthma 08/11/2010   Insomnia 08/11/2010   GERD  12/08/2007   Irritable bowel syndrome 12/08/2007    Ward Chatters, PT 02/26/2021, 1:44 PM  [facility] [address] [place], [place], [address] Phone: [phone]   Fax:  [phone]  Name: George [name] MRN: [id number] Date of Birth: 10-30-1965

## 2021-03-17 ENCOUNTER — Ambulatory Visit: Payer: No Typology Code available for payment source | Attending: Sports Medicine

## 2021-03-17 ENCOUNTER — Other Ambulatory Visit: Payer: Self-pay

## 2021-03-17 DIAGNOSIS — M6281 Muscle weakness (generalized): Secondary | ICD-10-CM | POA: Diagnosis present

## 2021-03-17 DIAGNOSIS — M25551 Pain in right hip: Secondary | ICD-10-CM | POA: Diagnosis present

## 2021-03-17 NOTE — Therapy (Signed)
[place], [place], [address] Phone: [phone]   Fax:  [phone]  Physical Therapy Treatment/Discharge  Patient Details  Name: George [name] MRN: [id number] Date of Birth: 1965/06/04 Referring Provider (PT): Laurin Coder, MD   Encounter Date: 03/17/2021   PT End of Session - 03/17/21 0838     Visit Number 9    Number of Visits 17    Date for PT Re-Evaluation 03/13/21    Authorization Type [employer] - FOTO 6th and 10th    PT Start Time 0838    PT Stop Time 0908    PT Time Calculation (min) 30 min    Activity Tolerance Patient tolerated treatment well;No increased pain    Behavior During Therapy WFL for tasks assessed/performed             Past Medical History:  Diagnosis Date   Anxiety    Colon polyps    GERD (gastroesophageal reflux disease)    Seasonal allergies     Past Surgical History:  Procedure Laterality Date   COLONOSCOPY     UPPER GASTROINTESTINAL ENDOSCOPY      There were no vitals filed for this visit.   Subjective Assessment - 03/17/21 0838     Subjective Pt presents to PT with reports of slight R hip pain and disocmfort, noting he was not able to perform HEP quite as much as he wanted to. Pt is ready to begin discharge plan at this time.    Currently in Pain? No/denies    Pain Score 0-No pain           [facility] Adult PT Treatment/Exercise:   Therapeutic Exercise: Supine piriformis stretch x 30" R Single leg bridge x 10 ea Standing hip abd/ext 2x15 BTB Lateral walk BTB x 4  KB deadlift x 10 - 45lbs Single leg KB deadlift x 10 ea 15lbs     [facility] PT Assessment - 03/17/21 0001       Observation/Other Assessments   Focus on Therapeutic Outcomes (FOTO)  78% function                                    PT Education - 03/17/21 0909     Education Details HEP and discharge plan    Person(s) Educated Patient    Methods  Explanation;Demonstration;Handout    Comprehension Verbalized understanding;Returned demonstration              PT Short Term Goals - 01/16/21 1421       PT SHORT TERM GOAL #1   Title Pt will be compliant with initial HEP for improved comfort and carryover    Baseline initial HEP given    Time 3    Period Weeks    Status New    Target Date 02/06/21               PT Long Term Goals - 03/17/21 0843       PT LONG TERM GOAL #1   Title Pt will improve FOTO function score to predicted value as proxy for functional improvement    Time 8    Period Weeks    Status Achieved    Target Date 03/13/21      PT LONG TERM GOAL #2   Title Pt will self report R hip pain no greater than 2/10 at worst for improved comfort and function  Baseline 5/10 at [facility]rost - 0/10 at present    Time 8    Period Weeks    Status Achieved    Target Date 03/13/21      PT LONG TERM GOAL #3   Title Pt will be able to jog at least 30 min without inc of R hip pain for improved functional ability to return to previous active lifestyle    Baseline unable    Time 8    Period Weeks    Status Achieved    Target Date 03/13/21                   Plan - 03/17/21 5361     Clinical Impression Statement Pt was able to complete all prescribed exercises exercises and demonstrated knowledge of HEP with no adverse effect. Over the course of PT treatment, pt has been able to greatly decrease pain and discomfort in R hip, noting 0/10 pain in last few weeks and meeting LTG for FOTO, showing subjective improvement in functional ability. He is now able to job consistently for 25-30 min with no increase in hip pain, and should continue to improve with HEP compliance. Pt is being discharged with HEP in place and is in agreement with current POC.    PT Treatment/Interventions ADLs/Self Care Home Management;Electrical Stimulation;Cryotherapy;Moist Heat;Gait training;Stair training;Functional mobility  training;Therapeutic activities;Therapeutic exercise;Balance training;Neuromuscular re-education;Patient/family education;Manual techniques;Dry needling;Passive range of motion;Vasopneumatic Device;Taping;Joint Manipulations;Spinal Manipulations    PT Next Visit Plan assess goals and response to HEP    PT Home Exercise Plan Access Code: [email or web address]    Consulted and Agree with Plan of Care Patient             Patient will benefit from skilled therapeutic intervention in order to improve the following deficits and impairments:  Decreased activity tolerance, Decreased mobility, Decreased strength, Pain, Impaired flexibility  Visit Diagnosis: Muscle weakness (generalized)  Pain in right hip     Problem List Patient Active Problem List   Diagnosis Date Noted   Whiplash 12/09/2020   Foot contusion 12/09/2020   Right knee pain 06/04/2020   Mild sleep apnea 05/27/2020   GAD (generalized anxiety disorder) 03/17/2020   Major depressive disorder with single episode, in full remission ([place]) 03/17/2020   Hamstring tightness of right lower extremity 11/02/2019   Hyperlipidemia, unspecified 07/03/2019   Callus of foot 05/17/2019   Vitamin D deficiency 03/21/2018   B12 deficiency 03/21/2018   Poor posture 12/22/2017   Capsulitis of metatarsophalangeal (MTP) joint of left foot 12/22/2017   Nonallopathic lesion of cervical region 10/28/2017   Nonallopathic lesion of rib cage 10/28/2017   Tibialis posterior tendinitis, right 08/30/2017   Extensor intersection syndrome of right wrist 02/24/2017   ECU (extensor carpi ulnaris), subluxation/dislocation, right, initial encounter 07/19/2016   Lumbar radiculopathy 08/14/2015   Nonallopathic lesion of thoracic region 05/19/2015   Nonallopathic lesion of lumbosacral region 05/19/2015   Nonallopathic lesion of sacral region 05/19/2015   Piriformis syndrome of right side 04/24/2015   Allergic rhinitis due to pollen 04/23/2015   Extrinsic asthma  08/11/2010   Insomnia 08/11/2010   GERD 12/08/2007   Irritable bowel syndrome 12/08/2007    Ward Chatters, PT 03/17/2021, 9:29 AM  [facility] [address] [place], [place], [address] Phone: [phone]   Fax:  [phone]  Name: George [name] MRN: [id number] Date of Birth: 11/04/1965   PHYSICAL THERAPY DISCHARGE SUMMARY  Visits from Start of Care: 9  Current functional level related  to goals / functional outcomes: See goals and objective   Remaining deficits: See goals and objective   Education / Equipment: HEP   Patient agrees to discharge. Patient goals were met. Patient is being discharged due to meeting the stated rehab goals.

## 2021-03-30 ENCOUNTER — Other Ambulatory Visit: Payer: Self-pay

## 2021-03-30 ENCOUNTER — Other Ambulatory Visit: Payer: No Typology Code available for payment source | Admitting: *Deleted

## 2021-03-30 DIAGNOSIS — E782 Mixed hyperlipidemia: Secondary | ICD-10-CM

## 2021-03-30 LAB — HEPATIC FUNCTION PANEL
ALT: 26 IU/L (ref 0–44)
AST: 25 IU/L (ref 0–40)
Albumin: 4.4 g/dL (ref 3.8–4.9)
Alkaline Phosphatase: 75 IU/L (ref 44–121)
Bilirubin Total: 0.4 mg/dL (ref 0.0–1.2)
Bilirubin, Direct: 0.14 mg/dL (ref 0.00–0.40)
Total Protein: 7.1 g/dL (ref 6.0–8.5)

## 2021-03-30 LAB — LIPID PANEL
Chol/HDL Ratio: 2.8 ratio (ref 0.0–5.0)
Cholesterol, Total: 154 mg/dL (ref 100–199)
HDL: 56 mg/dL (ref >39)
LDL Chol Calc (NIH): 85 mg/dL (ref 0–99)
Triglycerides: 63 mg/dL (ref 0–149)
VLDL Cholesterol Cal: 13 mg/dL (ref 5–40)

## 2021-04-10 ENCOUNTER — Encounter: Payer: Self-pay | Admitting: Family Medicine

## 2021-04-10 ENCOUNTER — Ambulatory Visit (INDEPENDENT_AMBULATORY_CARE_PROVIDER_SITE_OTHER): Payer: No Typology Code available for payment source | Admitting: Family Medicine

## 2021-04-10 ENCOUNTER — Other Ambulatory Visit: Payer: Self-pay

## 2021-04-10 VITALS — BP 126/75 | HR 74 | Temp 98.6°F | Ht 67.5 in | Wt 183.0 lb

## 2021-04-10 DIAGNOSIS — R051 Acute cough: Secondary | ICD-10-CM

## 2021-04-10 DIAGNOSIS — I700 Atherosclerosis of aorta: Secondary | ICD-10-CM

## 2021-04-10 DIAGNOSIS — Z125 Encounter for screening for malignant neoplasm of prostate: Secondary | ICD-10-CM

## 2021-04-10 DIAGNOSIS — G4730 Sleep apnea, unspecified: Secondary | ICD-10-CM | POA: Diagnosis not present

## 2021-04-10 DIAGNOSIS — Z0001 Encounter for general adult medical examination with abnormal findings: Secondary | ICD-10-CM

## 2021-04-10 DIAGNOSIS — E785 Hyperlipidemia, unspecified: Secondary | ICD-10-CM

## 2021-04-10 DIAGNOSIS — F325 Major depressive disorder, single episode, in full remission: Secondary | ICD-10-CM

## 2021-04-10 DIAGNOSIS — Z0000 Encounter for general adult medical examination without abnormal findings: Secondary | ICD-10-CM

## 2021-04-10 DIAGNOSIS — J4541 Moderate persistent asthma with (acute) exacerbation: Secondary | ICD-10-CM | POA: Diagnosis not present

## 2021-04-10 LAB — POCT INFLUENZA A/B
Influenza A, POC: NEGATIVE
Influenza B, POC: NEGATIVE

## 2021-04-10 NOTE — Progress Notes (Signed)
Phone: [phone]   Subjective:  Patient presents today for their annual physical. Chief complaint-noted.   See problem oriented charting- ROS- full  review of systems was completed and negative  except for: fatigue, sinus pressure, neck stiffness, headaches  The following were reviewed and entered/updated in epic: Past Medical History:  Diagnosis Date   Anxiety    Colon polyps    GERD (gastroesophageal reflux disease)    Seasonal allergies    Patient Active Problem List   Diagnosis Date Noted   Aortic atherosclerosis ([place]) 04/10/2021    Priority: Medium    Mild sleep apnea 05/27/2020    Priority: Medium    GAD (generalized anxiety disorder) 03/17/2020    Priority: Medium    Major depressive disorder with single episode, in full remission ([place]) 03/17/2020    Priority: Medium    Hyperlipidemia, unspecified 07/03/2019    Priority: Medium    Vitamin D deficiency 03/21/2018    Priority: Medium    B12 deficiency 03/21/2018    Priority: Medium    Allergic rhinitis due to pollen 04/23/2015    Priority: Medium    Extrinsic asthma 08/11/2010    Priority: Medium    Insomnia 08/11/2010    Priority: Medium    GERD 12/08/2007    Priority: Medium    Irritable bowel syndrome 12/08/2007    Priority: Medium    Callus of foot 05/17/2019    Priority: Low   Poor posture 12/22/2017    Priority: Low   Capsulitis of metatarsophalangeal (MTP) joint of left foot 12/22/2017    Priority: Low   Nonallopathic lesion of cervical region 10/28/2017    Priority: Low   Nonallopathic lesion of rib cage 10/28/2017    Priority: Low   Tibialis posterior tendinitis, right 08/30/2017    Priority: Low   Extensor intersection syndrome of right wrist 02/24/2017    Priority: Low   ECU (extensor carpi ulnaris), subluxation/dislocation, right, initial encounter 07/19/2016    Priority: Low   Lumbar radiculopathy 08/14/2015    Priority: Low   Nonallopathic lesion of thoracic region 05/19/2015     Priority: Low   Nonallopathic lesion of lumbosacral region 05/19/2015    Priority: Low   Nonallopathic lesion of sacral region 05/19/2015    Priority: Low   Piriformis syndrome of right side 04/24/2015    Priority: Low   Whiplash 12/09/2020   Foot contusion 12/09/2020   Right knee pain 06/04/2020   Hamstring tightness of right lower extremity 11/02/2019   Past Surgical History:  Procedure Laterality Date   COLONOSCOPY     UPPER GASTROINTESTINAL ENDOSCOPY      Family History  Problem Relation Age of Onset   Obesity Father    Asthma Father    Hypertension Father    Bladder Cancer Father    CAD Father        prior smoker. cabg in late 70s   Colon polyps Mother    Healthy Sister    Bipolar disorder Brother    Colon cancer Maternal Grandmother    Diabetes Neg Hx    Early death Neg Hx    Heart disease Neg Hx    Hyperlipidemia Neg Hx    Kidney disease Neg Hx    Stroke Neg Hx    Esophageal cancer Neg Hx    Stomach cancer Neg Hx     Medications- reviewed and updated Current Outpatient Medications  Medication Sig Dispense Refill   albuterol (VENTOLIN HFA) 108 (90 Base) MCG/ACT inhaler Inhale  2 puffs into the lungs every 6 (six) hours as needed for wheezing or shortness of breath. 18 g 2   budesonide-formoterol (SYMBICORT) 160-4.5 MCG/ACT inhaler Inhale 2 puffs into the lungs 2 (two) times daily. 3 each 3   cetirizine (ZYRTEC) 10 MG tablet Take 10 mg by mouth at bedtime.     dexlansoprazole (DEXILANT) 60 MG capsule Take 1 capsule (60 mg total) by mouth daily. 90 capsule 3   escitalopram (LEXAPRO) 20 MG tablet Take 1 tablet (20 mg total) by mouth daily. 90 tablet 3   fluticasone (FLONASE) 50 MCG/ACT nasal spray Place 2 sprays into both nostrils daily. 16 g 11   gabapentin (NEURONTIN) 100 MG capsule Take 2 capsules (200 mg total) by mouth at bedtime. 180 capsule 1   Melatonin 5 MG TABS Take by mouth.     rosuvastatin (CRESTOR) 10 MG tablet Take 1 tablet (10 mg total) by mouth  daily. 90 tablet 3   ketoconazole (NIZORAL) 2 % cream Apply topically daily. (Patient not taking: Reported on 04/10/2021)     No current facility-administered medications for this visit.    Allergies-reviewed and updated Allergies  Allergen Reactions   Trazodone And Nefazodone     Syncopal episode     Social History   Social History Narrative   Married. 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       [place]: tae kwon do, exercise, movies, hiking   Objective  Objective:  BP 126/75 (BP Location: Left Arm, Patient Position: Sitting)    Pulse 74    Temp 98.6 F (37 C) (Oral)    Ht 5' 7.5" (1.715 m)    Wt 183 lb (83 kg)    BMI 28.24 kg/m  Gen: NAD, resting comfortably HEENT: Mucous membranes are moist. Oropharynx normal- mild erythema in pharynx. TM normal Neck: no visiblethyromegaly CV: RRR no murmurs rubs or gallops Lungs: CTAB no crackles, wheeze, rhonchi Abdomen: soft/nontender/nondistended/normal bowel sounds. No rebound or guarding.  Ext: no edema Skin: warm, dry Neuro: grossly normal, moves all extremities, PERRLA  Results for orders placed or performed in visit on 04/10/21 (from the past 24 hour(s))  POCT Influenza A/B     Status: None   Collection Time: 04/10/21  4:11 PM  Result Value Ref Range   Influenza A, POC Negative Negative   Influenza B, POC Negative Negative      Assessment and Plan  56 y.o. male presenting for annual physical.  Health Maintenance counseling: 1. Anticipatory guidance: Patient counseled regarding regular dental exams -q6 months, eye exams -yearly,  avoiding smoking and second hand smoke , limiting alcohol to 2 beverages per day - less than 2 a week.  No illicit drugs  2. Risk factor reduction:  Advised patient of need for regular exercise and diet rich and fruits and vegetables to reduce risk of heart attack and stroke.  Exercise-basically does daily- primary stress relief- had to stop taekwondo due to hip  (hip has been better), running some, swimming some, weight lifting, ellipitical, and yoga Diet/weight management-reasonably healthy diet- feels could cut down on sweets and daughter is Child psychotherapist. Limited red meat 2-3x a month, could increase veggie intake. Has cut down on ice cream.  Wt Readings from Last 3 Encounters:  04/10/21 183 lb (83 kg)  01/27/21 180 lb (81.6 kg)  01/06/21 181 lb (82.1 kg)  3. Immunizations/screenings/ancillary studies DISCUSSED:  -Shingrix vaccination #1- consider in future when not  ill Immunization History  Administered Date(s) Administered   Influenza Split 01/27/2012, 12/13/2012, 12/13/2013   Influenza Whole 12/08/2007   Influenza,inj,Quad PF,6+ Mos 12/05/2014, 12/22/2017, 12/08/2018, 11/27/2019, 12/26/2020   PFIZER(Purple Top)SARS-COV-2 Vaccination 05/19/2019, 06/09/2019, 12/28/2019   Pfizer Covid-19 Vaccine Bivalent Booster 17yrs & up 12/26/2020   Pneumococcal Polysaccharide-23 06/25/2011   Tdap 06/25/2011  4. Prostate cancer screening-  no family history, turns 9 soon so we will start Lab Results  Component Value Date   PSA 0.60 12/05/2007   5. Colon cancer screening - colonoscopy 12/02/16 with 5 year repeat planned later this year 6. Skin cancer screening-  dermatology within the last 6 months and good report.advised regular sunscreen use. Denies worrisome, changing, or new skin lesions.  7. Smoking associated screening (lung cancer screening, AAA screen 65-75, UA)- never smoker 8. STD screening - monogamous   Status of chronic or acute concerns   # Hx of COVID - in 07/2020.  He is having some sick symptoms today including congestion, sore throat, headache.  Home test was negative for COVID on Tuesday and this morning. Son was ill last week- covid tested negative. On Tuesday he started to feel poorly, better wed and Thursday and then last night was rought- achy, congested, possible fever but none today. Congestion better today - Discussed rapid flu test-  negative - likely viral illness- he wants to monitor for now    #Headaches S: patient with rather constant nondebilitating headaches worse up to 3-4/10. Dates back to at least April but more off and on. Doesn't remember in April if treating with augmentin helped- we thought potentially sinusitis with sinus congestion. Sleep study ended up not being very helpful. Feels a lot of tension in jaw- possible TMJ previously discussed (may try mouthguard at night or could go to [facility] who focuses on this).   -No facial or extremity weakness. No slurred words or trouble swallowing. no blurry vision or double vision. No paresthesias. No confusion or increased word finding difficulties.  - no sodas. 1 big cup of coffee in AM, occasional in PM. Headache does not correlate with this.  - excedrin migraine helps slightly- does take advil for hip pain  and helps with headaches as well- 4-5 x a week.  A/P: he is going to try mouthguard. Mentioned trial off 2 weeks all nsaids then restart twice a week max for possible rebound headache. If not improving or worsens discussed MRI or neuro referral.  -within a month if not improving should message me (1 week mouthguard, 2 weeks no nsaids, then can try twice a week nsaids or tylenol max) or sooner if worsens   #Depression S: Medication: Lexapro 20 mg -Patient did have an episode of syncope back in September-cardiology advised him to ideally avoid trazodone and gabapentin.  Was advised to remain well-hydrated. He thinks it was trazodone related- has not had issues on gabeentin.  -In the past noted a lot of work-related stress- still a ton of stress and looking for other work -considered pristiq if not improved.  -Continued counseling every other week Depression screen [facility] 2/9 04/10/2021 07/30/2020 05/27/2020  Decreased Interest 0 0 0  Down, Depressed, Hopeless 0 1 3  PHQ - 2 Score 0 1 3  Altered sleeping 1 0 1  Tired, decreased energy 2 1 0  Change in  appetite 0 0 0  Feeling bad or failure about yourself  0 0 0  Trouble concentrating 0 0 2  Moving slowly or fidgety/restless 0 0  0  Suicidal thoughts 0 0 0  PHQ-9 Score 3 2 6   Difficult doing work/chores Not difficult at all Not difficult at all Somewhat difficult  Some recent data might be hidden  A/P: full remission- continue current meds  # Mild Sleep Apnea only- no cpap treatment required   # Asthma S: Maintenance Medication: symbicort 2 puffs twice daily. Doing pretty well even with asthma. With smart type therapy- im ok with him using prn- he has tried this and working well if he uses regularly for a few days clears up.  - prior cough issues better- only with cough A/P: reasonable control lately- im ok with sticking with symbicort as needed- would use on run days  #hyperlipidemia-CT cardiac scoring 97.7 on 12/26/2018 81st percentile for age #Aortic atherosclerosis S: Medication:Rosuvastatin 10 mg was recommended by Dr. Scarlette Calico Lab Results  Component Value Date   CHOL 154 03/30/2021   HDL 56 03/30/2021   LDLCALC 85 03/30/2021   LDLDIRECT 159.3 06/25/2011   TRIG 63 03/30/2021   CHOLHDL 2.8 03/30/2021   A/P: mildly elevated above ideal goal of 70 or less. Cardiology is ok with this range for now- I think we have space to work on diet primarily- does a great job still with exercise- continue current medicine for now   Recommended follow up: Return in about 1 year (around 04/10/2022) for physical or sooner if needed.  Lab/Order associations: NOT fasting   ICD-10-CM   1. Moderate persistent extrinsic asthma with acute exacerbation  J45.41     2. Preventative health care  Z00.00 CBC with Differential/Platelet    Comprehensive metabolic panel    PSA    3. Major depressive disorder with single episode, in full remission ([place])  F32.5     4. Mild sleep apnea  G47.30     5. Aortic atherosclerosis ([facility])  I70.0     6. Hyperlipidemia, unspecified hyperlipidemia type  E78.5 CBC  with Differential/Platelet    Comprehensive metabolic panel    CANCELED: CBC With Differential/Platelet    CANCELED: COMPLETE METABOLIC PANEL WITH GFR    7. Screening for prostate cancer  Z12.5 PSA    CANCELED: PSA    8. Acute cough  R05.1 POCT Influenza A/B      No orders of the defined types were placed in this encounter.  I,Jada Bradford,acting as a scribe for Garret Reddish, MD.,have documented all relevant documentation on the behalf of Garret Reddish, MD,as directed by  Garret Reddish, MD while in the presence of Garret Reddish, MD.   I, Garret Reddish, MD, have reviewed all documentation for this visit. The documentation on 04/10/21 for the exam, diagnosis, procedures, and orders are all accurate and complete.   Return precautions advised.  Garret Reddish, MD

## 2021-04-10 NOTE — Patient Instructions (Addendum)
Team- Flu swab today  -within a month if not improving should message me (1 week mouthguard, 2 weeks no nsaids, then can try twice a week nsaids or tylenol max) or sooner if worsens -consider mri vs neurology consult  Please go to [facility] (updated 05/10/2019) - located [address] across [address] from [place] - in the basement - Hours: 7:30-5:30 PM M-F. You do NOT need an appointment.  - Please ensure you are covid symptom free before going in(No fever, chills, cough, congestion, runny nose, shortness of breath, fatigue, body aches, sore throat, headache, nausea, vomiting, diarrhea, or new loss of taste or smell. No known contacts with covid 19 or someone being tested for covid 19)  Recommended follow up: Return in about 1 year (around 04/10/2022) for physical or sooner if needed.

## 2021-04-13 ENCOUNTER — Encounter: Payer: Self-pay | Admitting: Family Medicine

## 2021-04-13 DIAGNOSIS — J4541 Moderate persistent asthma with (acute) exacerbation: Secondary | ICD-10-CM

## 2021-04-14 ENCOUNTER — Other Ambulatory Visit: Payer: Self-pay | Admitting: *Deleted

## 2021-04-14 ENCOUNTER — Ambulatory Visit (INDEPENDENT_AMBULATORY_CARE_PROVIDER_SITE_OTHER): Payer: No Typology Code available for payment source

## 2021-04-14 ENCOUNTER — Ambulatory Visit (INDEPENDENT_AMBULATORY_CARE_PROVIDER_SITE_OTHER)
Admission: RE | Admit: 2021-04-14 | Discharge: 2021-04-14 | Disposition: A | Discharge status: Home / Self Care | Payer: No Typology Code available for payment source | Source: Ambulatory Visit | Attending: Family Medicine | Admitting: Family Medicine

## 2021-04-14 ENCOUNTER — Other Ambulatory Visit: Payer: Self-pay

## 2021-04-14 DIAGNOSIS — R059 Cough, unspecified: Secondary | ICD-10-CM

## 2021-04-14 DIAGNOSIS — R509 Fever, unspecified: Secondary | ICD-10-CM

## 2021-04-14 DIAGNOSIS — J029 Acute pharyngitis, unspecified: Secondary | ICD-10-CM

## 2021-04-14 LAB — POCT RAPID STREP A (OFFICE): Rapid Strep A Screen: NEGATIVE

## 2021-04-14 MED ORDER — BENZONATATE 100 MG PO CAPS: 100.0000 mg | ORAL_CAPSULE | Freq: Two times a day (BID) | ORAL | 0 refills | Status: DC | PRN

## 2021-04-14 MED ORDER — ALBUTEROL SULFATE HFA 108 (90 BASE) MCG/ACT IN AERS: 2.0000 | INHALATION_SPRAY | Freq: Four times a day (QID) | RESPIRATORY_TRACT | 2 refills | Status: DC | PRN

## 2021-04-14 NOTE — Addendum Note (Signed)
Addended by: Marin Olp on: 04/14/2021 07:35 PM   Modules accepted: Orders

## 2021-04-14 NOTE — Telephone Encounter (Signed)
Patient went to [facility] radiology for chest X-ray, received a call from [facility] asking for x-ray order to be entered. Order placed under cough and fever per Dr. Ansel Bong note. Called George [name] and confirmed order received and pt on the schedule to get his chest x-ray this morning.

## 2021-04-24 ENCOUNTER — Other Ambulatory Visit: Payer: Self-pay

## 2021-04-24 ENCOUNTER — Encounter: Payer: Self-pay | Admitting: Family Medicine

## 2021-04-24 ENCOUNTER — Telehealth (INDEPENDENT_AMBULATORY_CARE_PROVIDER_SITE_OTHER): Payer: No Typology Code available for payment source | Admitting: Family Medicine

## 2021-04-24 VITALS — Temp 98.7°F | Ht 67.5 in | Wt 175.0 lb

## 2021-04-24 DIAGNOSIS — J4540 Moderate persistent asthma, uncomplicated: Secondary | ICD-10-CM | POA: Diagnosis not present

## 2021-04-24 DIAGNOSIS — B9689 Other specified bacterial agents as the cause of diseases classified elsewhere: Secondary | ICD-10-CM | POA: Diagnosis not present

## 2021-04-24 DIAGNOSIS — J329 Chronic sinusitis, unspecified: Secondary | ICD-10-CM | POA: Diagnosis not present

## 2021-04-24 MED ORDER — AMOXICILLIN-POT CLAVULANATE 875-125 MG PO TABS: 1.0000 | ORAL_TABLET | Freq: Two times a day (BID) | ORAL | 0 refills | Status: AC

## 2021-04-24 NOTE — Progress Notes (Signed)
Phone [phone] Virtual visit via Video note   Subjective:  Chief complaint: Chief Complaint  Patient presents with   Cough    Pt states these symptoms have been going on for the past 2 weeks, he states he still gets fatigued easily. He states he is coughing up clear phlegm. Pt has tested twice for covid and has been negative.   Headache   This visit type was conducted due to national recommendations for restrictions regarding the COVID-19 Pandemic (e.g. social distancing).  This format is felt to be most appropriate for this patient at this time balancing risks to patient and risks to population by having him in for in person visit.  No physical exam was performed (except for noted visual exam or audio findings with Telehealth visits).    Our team/I connected with Boykin Reaper at  4:20 PM EST by a video enabled telemedicine application ([email or web address] or caregility through epic) and verified that I am speaking with the correct person using two identifiers.  Location patient: [facility] Location provider: [facility], office Persons participating in the virtual visit:  patient  Our team/I discussed the limitations of evaluation and management by telemedicine and the availability of in person appointments. In light of current covid-19 pandemic, patient also understands that we are trying to protect them by minimizing in office contact if at all possible.  The patient expressed consent for telemedicine visit and agreed to proceed. Patient understands insurance will be billed.   Past Medical History-  Patient Active Problem List   Diagnosis Date Noted   Aortic atherosclerosis ([place]) 04/10/2021    Priority: Medium    Mild sleep apnea 05/27/2020    Priority: Medium    GAD (generalized anxiety disorder) 03/17/2020    Priority: Medium    Major depressive disorder with single episode, in full remission ([place]) 03/17/2020    Priority: Medium    Hyperlipidemia, unspecified 07/03/2019    Priority: Medium     Vitamin D deficiency 03/21/2018    Priority: Medium    B12 deficiency 03/21/2018    Priority: Medium    Allergic rhinitis due to pollen 04/23/2015    Priority: Medium    Extrinsic asthma 08/11/2010    Priority: Medium    Insomnia 08/11/2010    Priority: Medium    GERD 12/08/2007    Priority: Medium    Irritable bowel syndrome 12/08/2007    Priority: Medium    Callus of foot 05/17/2019    Priority: Low   Poor posture 12/22/2017    Priority: Low   Capsulitis of metatarsophalangeal (MTP) joint of left foot 12/22/2017    Priority: Low   Nonallopathic lesion of cervical region 10/28/2017    Priority: Low   Nonallopathic lesion of rib cage 10/28/2017    Priority: Low   Tibialis posterior tendinitis, right 08/30/2017    Priority: Low   Extensor intersection syndrome of right wrist 02/24/2017    Priority: Low   ECU (extensor carpi ulnaris), subluxation/dislocation, right, initial encounter 07/19/2016    Priority: Low   Lumbar radiculopathy 08/14/2015    Priority: Low   Nonallopathic lesion of thoracic region 05/19/2015    Priority: Low   Nonallopathic lesion of lumbosacral region 05/19/2015    Priority: Low   Nonallopathic lesion of sacral region 05/19/2015    Priority: Low   Piriformis syndrome of right side 04/24/2015    Priority: Low   Whiplash 12/09/2020   Foot contusion 12/09/2020   Right knee pain 06/04/2020   Hamstring tightness  of right lower extremity 11/02/2019    Medications- reviewed and updated Current Outpatient Medications  Medication Sig Dispense Refill   albuterol (VENTOLIN HFA) 108 (90 Base) MCG/ACT inhaler Inhale 2 puffs into the lungs every 6 (six) hours as needed for wheezing or shortness of breath. 18 g 2   amoxicillin-clavulanate (AUGMENTIN) 875-125 MG tablet Take 1 tablet by mouth 2 (two) times daily for 7 days. 14 tablet 0   budesonide-formoterol (SYMBICORT) 160-4.5 MCG/ACT inhaler Inhale 2 puffs into the lungs 2 (two) times daily. 3 each 3    cetirizine (ZYRTEC) 10 MG tablet Take 10 mg by mouth at bedtime.     dexlansoprazole (DEXILANT) 60 MG capsule Take 1 capsule (60 mg total) by mouth daily. 90 capsule 3   escitalopram (LEXAPRO) 20 MG tablet Take 1 tablet (20 mg total) by mouth daily. 90 tablet 3   fluticasone (FLONASE) 50 MCG/ACT nasal spray Place 2 sprays into both nostrils daily. 16 g 11   gabapentin (NEURONTIN) 100 MG capsule Take 2 capsules (200 mg total) by mouth at bedtime. 180 capsule 1   ketoconazole (NIZORAL) 2 % cream Apply topically daily.     Melatonin 5 MG TABS Take by mouth.     rosuvastatin (CRESTOR) 10 MG tablet Take 1 tablet (10 mg total) by mouth daily. 90 tablet 3   No current facility-administered medications for this visit.     Objective:  Temp 98.7 F (37.1 C)    Ht 5' 7.5" (1.715 m)    Wt 175 lb (79.4 kg)    BMI 27.00 kg/m  self reported vitals Gen: NAD, resting comfortably Points to maxillary sinuses as source of pain Lungs: nonlabored, normal respiratory rate  Skin: appears dry, no obvious rash    Assessment and Plan   #Bacterial sinusitis concern #Asthma S:2 weeks of symptoms at this point. Stuck at abotu 60% of his normal. Still having a lot of congestion, coughing sputum up- luckily not keeping him up at night, still having headaches and feeling fatigue as well. Headaches are really over frontal sinuses. No ear pain. No ongoing fevers- nothing within a week.  Cough has improved some. He is using symbicort and albuterol regularly now- not having much wheezing.   All 4 in family with similar all testing negative for covid and he tested negative for flu last visit.   A/P: Patient with no 2 weeks worth of symptoms and no longer seems to be improving-symptoms now seem to center even more in his sinuses with congestion and sinus pressure being primary complaints-concern for bacterial superinfection/bacterial sinusitis-we will treat with Augmentin  His asthma actually seems to be in a better  position after restarting Symbicort-I do not think we need prednisone at this time-actually told him I thought he could stop his albuterol and only use as needed but stay consistent with Symbicort until illness has resolved-he agrees -She will reach out if symptoms fail to improve-would lean towards second course of antibiotics over CT maxillofacial-consider atypical coverage as neck steps such as doxycycline  Recommended follow up: If symptoms fail to improve-otherwise refer to last note  Lab/Order associations:   ICD-10-CM   1. Bacterial sinusitis  J32.9    B96.89     2. Moderate persistent extrinsic asthma without complication  G31.51       Meds ordered this encounter  Medications   amoxicillin-clavulanate (AUGMENTIN) 875-125 MG tablet    Sig: Take 1 tablet by mouth 2 (two) times daily for 7 days.  Dispense:  14 tablet    Refill:  0    Return precautions advised.  Garret Reddish, MD

## 2021-05-20 ENCOUNTER — Encounter: Payer: Self-pay | Admitting: Family Medicine

## 2021-05-25 ENCOUNTER — Other Ambulatory Visit: Payer: Self-pay | Admitting: Family Medicine

## 2021-05-25 DIAGNOSIS — J4541 Moderate persistent asthma with (acute) exacerbation: Secondary | ICD-10-CM

## 2021-06-16 ENCOUNTER — Encounter: Payer: Self-pay | Admitting: Family Medicine

## 2021-06-17 ENCOUNTER — Other Ambulatory Visit: Payer: Self-pay

## 2021-06-17 DIAGNOSIS — F325 Major depressive disorder, single episode, in full remission: Secondary | ICD-10-CM

## 2021-06-17 DIAGNOSIS — F411 Generalized anxiety disorder: Secondary | ICD-10-CM

## 2021-07-07 NOTE — Progress Notes (Signed)
?Charlann Boxer D.O. ?[facility] ?[place] ?Phone: [phone] ?Subjective:   ? ?I'm seeing this patient by the request  of:  Marin Olp, MD ? ?CC: Right hip pain, elbow pain ? ?[id number]  ?Faiz Weber is a 56 y.o. male coming in with complaint of back and neck pain. OMT11/15/202. Patient states that the Right glut/hip is tight. Patient also states that he is having right forearm pain going on for a couple weeks. No MOI, but was helping parents move and opening a bunch of boxes. Describes pain as dull and uses ice to help with the pain. ? ?Medications patient has been prescribed: None ? ? ?  ? ? ? ? ?Reviewed prior external information including notes and imaging from previsou exam, outside providers and external EMR if available.  ? ?As well as notes that were available from care everywhere and other healthcare systems. ? ?Past medical history, social, surgical and family history all reviewed in electronic medical record.  No pertanent information unless stated regarding to the chief complaint.  ? ?Past Medical History:  ?Diagnosis Date  ? Anxiety   ? Colon polyps   ? GERD (gastroesophageal reflux disease)   ? Seasonal allergies   ?  ?Allergies  ?Allergen Reactions  ? Trazodone And Nefazodone   ?  Syncopal episode   ? ? ? ?Review of Systems: ? No headache, visual changes, nausea, vomiting, diarrhea, constipation, dizziness, abdominal pain, skin rash, fevers, chills, night sweats, weight loss, swollen lymph nodes, body aches, joint swelling, chest pain, shortness of breath, mood changes. POSITIVE muscle aches ? ?Objective  ?Blood pressure 104/70, pulse (!) 58, height 5' 7.5" (1.715 m), weight 180 lb (81.6 kg), SpO2 98 %. ?  ?General: No apparent distress alert and oriented x3 mood and affect normal, dressed appropriately.  ?HEENT: Pupils equal, extraocular movements intact  ?Respiratory: Patient's speak in full sentences and does not appear short of breath   ?Cardiovascular: No lower extremity edema, non tender, no erythema  ?Low back exam does have tightness noted of the right gluteal area.  No significant discomfort noted.  Seems to be more of the greater trochanteric area. ? ?Osteopathic findings ? ?C2 flexed rotated and side bent right ?C6 flexed rotated and side bent left ?T3 extended rotated and side bent right inhaled rib ?T9 extended rotated and side bent left ?L2 flexed rotated and side bent right ?Sacrum right on right ? ? ?After verbal consent patient was prepped with alcohol swab and with a 22-gauge 2 inch needle injected with 2 cc of 0.5% Marcaine and 1 cc of Kenalog 40 mg/mL.  No blood loss.  Postinjection instructions given after Band-Aid placed ? ?  ?Assessment and Plan: ? ?Extensor intersection syndrome of right wrist ?Family the patient is having more of the intersection of the wrist.  We discussed avoiding certain repetitive activity and going back to the wrist brace.  The patient will increase the splint nightly for 2 weeks.  Follow-up again in 6 to 8 weeks continue to have trouble consider the possibility of formal physical therapy but patient will likely be traveling for the summer. ? ?Piriformis syndrome of right side ?Chronic, with exacerbation.  Has responded relatively well to injections and formal physical therapy over the years.  Patient does have the gabapentin.  Discussed hip abductor strengthening, increase activity slowly otherwise.  Follow-up with me again in 6 to 8 weeks ? ?Greater trochanteric bursitis of right hip ?Right-sided greater trochanteric bursitis with  patient having history also of the piriformis.  Patient did respond well to the injections previously ? ?Hopefully will do well with this 1 as well.  Discussed posture and ergonomics and home exercises.  Follow-up again 6 to 8 weeks  ? ?Nonallopathic problems ? ?Decision today to treat with OMT was based on Physical Exam ? ?After verbal consent patient was treated with HVLA,  ME, FPR techniques in cervical, rib, thoracic, lumbar, and sacral  areas ? ?Patient tolerated the procedure well with improvement in symptoms ? ?Patient given exercises, stretches and lifestyle modifications ? ?See medications in patient instructions if given ? ?Patient will follow up in 4-8 weeks ? ?  ? ?The above documentation has been reviewed and is accurate and complete [name], DO ? ? ? ?  ? ? Note: This dictation was prepared with Dragon dictation along with smaller phrase technology. Any transcriptional errors that result from this process are unintentional.    ?  ?  ? ?

## 2021-07-08 ENCOUNTER — Ambulatory Visit: Payer: No Typology Code available for payment source | Admitting: Family Medicine

## 2021-07-08 VITALS — BP 104/70 | HR 58 | Ht 67.5 in | Wt 180.0 lb

## 2021-07-08 DIAGNOSIS — M9908 Segmental and somatic dysfunction of rib cage: Secondary | ICD-10-CM | POA: Diagnosis not present

## 2021-07-08 DIAGNOSIS — M9901 Segmental and somatic dysfunction of cervical region: Secondary | ICD-10-CM

## 2021-07-08 DIAGNOSIS — M9903 Segmental and somatic dysfunction of lumbar region: Secondary | ICD-10-CM | POA: Diagnosis not present

## 2021-07-08 DIAGNOSIS — M7061 Trochanteric bursitis, right hip: Secondary | ICD-10-CM | POA: Diagnosis not present

## 2021-07-08 DIAGNOSIS — M65831 Other synovitis and tenosynovitis, right forearm: Secondary | ICD-10-CM

## 2021-07-08 DIAGNOSIS — G5701 Lesion of sciatic nerve, right lower limb: Secondary | ICD-10-CM | POA: Diagnosis not present

## 2021-07-08 DIAGNOSIS — M9904 Segmental and somatic dysfunction of sacral region: Secondary | ICD-10-CM | POA: Diagnosis not present

## 2021-07-08 DIAGNOSIS — M9902 Segmental and somatic dysfunction of thoracic region: Secondary | ICD-10-CM

## 2021-07-08 NOTE — Assessment & Plan Note (Signed)
Chronic, with exacerbation.  Has responded relatively well to injections and formal physical therapy over the years.  Patient does have the gabapentin.  Discussed hip abductor strengthening, increase activity slowly otherwise.  Follow-up with me again in 6 to 8 weeks ?

## 2021-07-08 NOTE — Patient Instructions (Addendum)
Good to see you  ?Injection given today ?Exercises given today  ?Follow up in 6-8 weeks ?

## 2021-07-08 NOTE — Assessment & Plan Note (Signed)
Family the patient is having more of the intersection of the wrist.  We discussed avoiding certain repetitive activity and going back to the wrist brace.  The patient will increase the splint nightly for 2 weeks.  Follow-up again in 6 to 8 weeks continue to have trouble consider the possibility of formal physical therapy but patient will likely be traveling for the summer. ?

## 2021-07-08 NOTE — Assessment & Plan Note (Signed)
Right-sided greater trochanteric bursitis with patient having history also of the piriformis.  Patient did respond well to the injections previously ? ?Hopefully will do well with this 1 as well.  Discussed posture and ergonomics and home exercises.  Follow-up again 6 to 8 weeks ?

## 2021-08-13 ENCOUNTER — Ambulatory Visit: Payer: No Typology Code available for payment source | Admitting: Family Medicine

## 2021-08-14 NOTE — Progress Notes (Signed)
[place] [facility] Phone: [phone] Subjective:   George [name], am serving as a scribe for Dr. Hulan Saas.   I'm seeing this patient by the request  of:  Marin Olp, MD  CC: Back and neck pain follow-up  [id number]  George [name] is a 56 y.o. male coming in with complaint of back and neck pain. OMT 07/08/2021. Patient states that his R hip is not any better. Injection did not help lateral hip. Massage therapist found tight muscle in L hip.   Also notes pain in medial aspect of R knee.   Also notes pain over B CMC joints.   Medications patient has been prescribed: None  Taking:         Reviewed prior external information including notes and imaging from previsou exam, outside providers and external EMR if available.   As well as notes that were available from care everywhere and other healthcare systems.  Past medical history, social, surgical and family history all reviewed in electronic medical record.  [name] pertanent information unless stated regarding to the chief complaint.   Past Medical History:  Diagnosis Date   Anxiety    Colon polyps    GERD (gastroesophageal reflux disease)    Seasonal allergies     Allergies  Allergen Reactions   Trazodone And Nefazodone     Syncopal episode      Review of Systems:  [name] headache, visual changes, nausea, vomiting, diarrhea, constipation, dizziness, abdominal pain, skin rash, fevers, chills, night sweats, weight loss, swollen lymph nodes, body aches, joint swelling, chest pain, shortness of breath, mood changes. POSITIVE muscle aches  Objective  Blood pressure 120/86, pulse 78, height 5' 7.5" (1.715 m), weight 182 lb (82.6 kg), SpO2 98 %.   General: [name] apparent distress alert and oriented x3 mood and affect normal, dressed appropriately.  HEENT: Pupils equal, extraocular movements intact  Respiratory: Patient's speak in full sentences and does  not appear short of breath  Cardiovascular: [name] lower extremity edema, non tender, [name] erythema  Low back exam does have some loss of lordosis.  Tenderness to palpation in the paraspinal musculature.  Tightness with FABER test right greater than left.  Patient does have some worsening pain noted in the paraspinal musculature of the lumbar spine  Osteopathic findings  C2 flexed rotated and side bent right C6 flexed rotated and side bent left T3 extended rotated and side bent right inhaled rib T9 extended rotated and side bent left L2 flexed rotated and side bent right Sacrum right on right       Assessment and Plan:  Greater trochanteric bursitis of right hip Concerned patient's pain may be coming from lower lumbar radiculopathy.  Patient did respond well to osteopathic manipulation today.  We discussed posture and ergonomics.  New x-rays ordered today.  Toradol and Depo-Medrol given secondary to the severity of pain.  Prednisone given for breakthrough with patient traveling.  Zanaflex also given for nighttime relief.  Follow-up with me again in 6 to 8 weeks  Piriformis syndrome of right side Also has had difficulty and will consider the possibility of injection if this continues to give him difficulty.  Lumbar radiculopathy Primary concern at this moment.  Discussed if worsening symptoms may need to consider advanced imaging.  With patient while traveling for the summer hopefully we will bring it down to a dull roar and have patient follow-up again in 6 to 8 weeks  Nonallopathic problems  Decision today to treat with OMT was based on Physical Exam  After verbal consent patient was treated with HVLA, ME, FPR techniques in cervical, rib, thoracic, lumbar, and sacral  areas  Patient tolerated the procedure well with improvement in symptoms  Patient given exercises, stretches and lifestyle modifications  See medications in patient instructions if given  Patient will follow up in  4-8 weeks     The above documentation has been reviewed and is accurate and complete [name], DO        Note: This dictation was prepared with Dragon dictation along with smaller phrase technology. Any transcriptional errors that result from this process are unintentional.

## 2021-08-16 ENCOUNTER — Other Ambulatory Visit: Payer: Self-pay | Admitting: Family Medicine

## 2021-08-17 ENCOUNTER — Ambulatory Visit: Payer: No Typology Code available for payment source | Admitting: Family Medicine

## 2021-08-17 ENCOUNTER — Encounter: Payer: Self-pay | Admitting: Family Medicine

## 2021-08-17 VITALS — BP 120/86 | HR 78 | Ht 67.5 in | Wt 182.0 lb

## 2021-08-17 DIAGNOSIS — M9904 Segmental and somatic dysfunction of sacral region: Secondary | ICD-10-CM | POA: Diagnosis not present

## 2021-08-17 DIAGNOSIS — G5701 Lesion of sciatic nerve, right lower limb: Secondary | ICD-10-CM

## 2021-08-17 DIAGNOSIS — M9901 Segmental and somatic dysfunction of cervical region: Secondary | ICD-10-CM

## 2021-08-17 DIAGNOSIS — M9902 Segmental and somatic dysfunction of thoracic region: Secondary | ICD-10-CM

## 2021-08-17 DIAGNOSIS — M5416 Radiculopathy, lumbar region: Secondary | ICD-10-CM | POA: Diagnosis not present

## 2021-08-17 DIAGNOSIS — M9903 Segmental and somatic dysfunction of lumbar region: Secondary | ICD-10-CM | POA: Diagnosis not present

## 2021-08-17 DIAGNOSIS — M7061 Trochanteric bursitis, right hip: Secondary | ICD-10-CM | POA: Diagnosis not present

## 2021-08-17 DIAGNOSIS — M9908 Segmental and somatic dysfunction of rib cage: Secondary | ICD-10-CM | POA: Diagnosis not present

## 2021-08-17 MED ORDER — METHYLPREDNISOLONE ACETATE 80 MG/ML IJ SUSP
80.0000 mg | Freq: Once | INTRAMUSCULAR | Status: AC
  Administered 2021-08-17: 80 mg via INTRAMUSCULAR

## 2021-08-17 MED ORDER — TIZANIDINE HCL 2 MG PO TABS: 2.0000 mg | ORAL_TABLET | Freq: Every day | ORAL | 0 refills | Status: DC

## 2021-08-17 MED ORDER — KETOROLAC TROMETHAMINE 60 MG/2ML IM SOLN
60.0000 mg | Freq: Once | INTRAMUSCULAR | Status: AC
  Administered 2021-08-17: 60 mg via INTRAMUSCULAR

## 2021-08-17 MED ORDER — PREDNISONE 20 MG PO TABS: 40.0000 mg | ORAL_TABLET | Freq: Every day | ORAL | 0 refills | Status: DC

## 2021-08-17 NOTE — Assessment & Plan Note (Signed)
Concerned patient's pain may be coming from lower lumbar radiculopathy.  Patient did respond well to osteopathic manipulation today.  We discussed posture and ergonomics.  New x-rays ordered today.  Toradol and Depo-Medrol given secondary to the severity of pain.  Prednisone given for breakthrough with patient traveling.  Zanaflex also given for nighttime relief.  Follow-up with me again in 6 to 8 weeks

## 2021-08-17 NOTE — Assessment & Plan Note (Signed)
Primary concern at this moment.  Discussed if worsening symptoms may need to consider advanced imaging.  With patient while traveling for the summer hopefully we will bring it down to a dull roar and have patient follow-up again in 6 to 8 weeks

## 2021-08-17 NOTE — Assessment & Plan Note (Signed)
Also has had difficulty and will consider the possibility of injection if this continues to give him difficulty.

## 2021-08-17 NOTE — Patient Instructions (Signed)
See you in August

## 2021-08-18 ENCOUNTER — Ambulatory Visit: Payer: No Typology Code available for payment source | Admitting: Family Medicine

## 2021-08-19 ENCOUNTER — Encounter: Payer: Self-pay | Admitting: Family Medicine

## 2021-08-19 ENCOUNTER — Ambulatory Visit (INDEPENDENT_AMBULATORY_CARE_PROVIDER_SITE_OTHER): Payer: No Typology Code available for payment source | Admitting: Family Medicine

## 2021-08-19 VITALS — BP 104/72 | HR 61 | Temp 97.8°F | Ht 67.5 in | Wt 184.0 lb

## 2021-08-19 DIAGNOSIS — R5383 Other fatigue: Secondary | ICD-10-CM

## 2021-08-19 DIAGNOSIS — Z79899 Other long term (current) drug therapy: Secondary | ICD-10-CM

## 2021-08-19 DIAGNOSIS — E785 Hyperlipidemia, unspecified: Secondary | ICD-10-CM

## 2021-08-19 DIAGNOSIS — K219 Gastro-esophageal reflux disease without esophagitis: Secondary | ICD-10-CM

## 2021-08-19 DIAGNOSIS — F325 Major depressive disorder, single episode, in full remission: Secondary | ICD-10-CM | POA: Diagnosis not present

## 2021-08-19 DIAGNOSIS — E559 Vitamin D deficiency, unspecified: Secondary | ICD-10-CM | POA: Diagnosis not present

## 2021-08-19 LAB — COMPREHENSIVE METABOLIC PANEL
ALT: 47 U/L (ref 0–53)
AST: 34 U/L (ref 0–37)
Albumin: 4.6 g/dL (ref 3.5–5.2)
Alkaline Phosphatase: 74 U/L (ref 39–117)
BUN: 23 mg/dL (ref 6–23)
CO2: 29 mEq/L (ref 19–32)
Calcium: 10 mg/dL (ref 8.4–10.5)
Chloride: 102 mEq/L (ref 96–112)
Creatinine, Ser: 1.1 mg/dL (ref 0.40–1.50)
GFR: 75.67 mL/min (ref >60.00)
Glucose, Bld: 89 mg/dL (ref 70–99)
Potassium: 4.1 mEq/L (ref 3.5–5.1)
Sodium: 139 mEq/L (ref 135–145)
Total Bilirubin: 0.5 mg/dL (ref 0.2–1.2)
Total Protein: 7.8 g/dL (ref 6.0–8.3)

## 2021-08-19 LAB — CBC WITH DIFFERENTIAL/PLATELET
Basophils Absolute: 0.1 10*3/uL (ref 0.0–0.1)
Basophils Relative: 0.8 % (ref 0.0–3.0)
Eosinophils Absolute: 0.2 10*3/uL (ref 0.0–0.7)
Eosinophils Relative: 2.9 % (ref 0.0–5.0)
HCT: 45.2 % (ref 39.0–52.0)
Hemoglobin: 15 g/dL (ref 13.0–17.0)
Lymphocytes Relative: 29.3 % (ref 12.0–46.0)
Lymphs Abs: 2.2 10*3/uL (ref 0.7–4.0)
MCHC: 33.2 g/dL (ref 30.0–36.0)
MCV: 92.7 fl (ref 78.0–100.0)
Monocytes Absolute: 0.7 10*3/uL (ref 0.1–1.0)
Monocytes Relative: 9.1 % (ref 3.0–12.0)
Neutro Abs: 4.3 10*3/uL (ref 1.4–7.7)
Neutrophils Relative %: 57.9 % (ref 43.0–77.0)
Platelets: 246 10*3/uL (ref 150.0–400.0)
RBC: 4.88 Mil/uL (ref 4.22–5.81)
RDW: 13 % (ref 11.5–15.5)
WBC: 7.4 10*3/uL (ref 4.0–10.5)

## 2021-08-19 LAB — VITAMIN B12: Vitamin B-12: 439 pg/mL (ref 211–911)

## 2021-08-19 LAB — TSH: TSH: 3.35 u[IU]/mL (ref 0.35–5.50)

## 2021-08-19 LAB — VITAMIN D 25 HYDROXY (VIT D DEFICIENCY, FRACTURES): VITD: 41.09 ng/mL (ref 30.00–100.00)

## 2021-08-19 NOTE — Patient Instructions (Addendum)
Please stop by lab before you go If you have mychart- we will send your results within 3 business days of [place] receiving them.  If you do not have mychart- we will call you about results within 5 business days of [place] receiving them.  *please also note that you will see labs on mychart as soon as they post. I will later go in and write notes on them- will say "notes from Dr. Yong Channel"   [facility] you have a fantastic trip!  Recommended follow up: Return in about 4 months (around 12/19/2021) for physical or sooner if needed.Schedule b4 you leave.

## 2021-08-19 NOTE — Progress Notes (Signed)
Phone [phone] In person visit   Subjective:   George [name] is a 56 y.o. year old very pleasant male patient who presents for/with See problem oriented charting Chief Complaint  Patient presents with   Follow-up    Pt states he is going out of the country and wants to make sure he is well before going.   Headache   Gastroesophageal Reflux    Pt states his reflux has been acting up for the past week.    Past Medical History-  Patient Active Problem List   Diagnosis Date Noted   Aortic atherosclerosis ([place]) 04/10/2021    Priority: Medium    Mild sleep apnea 05/27/2020    Priority: Medium    GAD (generalized anxiety disorder) 03/17/2020    Priority: Medium    Major depressive disorder with single episode, in full remission ([place]) 03/17/2020    Priority: Medium    Hyperlipidemia, unspecified 07/03/2019    Priority: Medium    Vitamin D deficiency 03/21/2018    Priority: Medium    B12 deficiency 03/21/2018    Priority: Medium    Allergic rhinitis due to pollen 04/23/2015    Priority: Medium    Extrinsic asthma 08/11/2010    Priority: Medium    Insomnia 08/11/2010    Priority: Medium    GERD 12/08/2007    Priority: Medium    Irritable bowel syndrome 12/08/2007    Priority: Medium    Callus of foot 05/17/2019    Priority: Low   Poor posture 12/22/2017    Priority: Low   Capsulitis of metatarsophalangeal (MTP) joint of left foot 12/22/2017    Priority: Low   Nonallopathic lesion of cervical region 10/28/2017    Priority: Low   Nonallopathic lesion of rib cage 10/28/2017    Priority: Low   Tibialis posterior tendinitis, right 08/30/2017    Priority: Low   Extensor intersection syndrome of right wrist 02/24/2017    Priority: Low   ECU (extensor carpi ulnaris), subluxation/dislocation, right, initial encounter 07/19/2016    Priority: Low   Lumbar radiculopathy 08/14/2015    Priority: Low   Nonallopathic lesion of thoracic region 05/19/2015    Priority: Low    Nonallopathic lesion of lumbosacral region 05/19/2015    Priority: Low   Nonallopathic lesion of sacral region 05/19/2015    Priority: Low   Piriformis syndrome of right side 04/24/2015    Priority: Low   Greater trochanteric bursitis of right hip 07/08/2021   Whiplash 12/09/2020   Foot contusion 12/09/2020   Right knee pain 06/04/2020   Hamstring tightness of right lower extremity 11/02/2019    Medications- reviewed and updated Current Outpatient Medications  Medication Sig Dispense Refill   albuterol (VENTOLIN HFA) 108 (90 Base) MCG/ACT inhaler Inhale 2 puffs into the lungs every 6 (six) hours as needed for wheezing or shortness of breath. 18 g 2   cetirizine (ZYRTEC) 10 MG tablet Take 10 mg by mouth at bedtime.     dexlansoprazole (DEXILANT) 60 MG capsule Take 1 capsule (60 mg total) by mouth daily. 90 capsule 3   escitalopram (LEXAPRO) 20 MG tablet TAKE 1 TABLET BY MOUTH EVERY DAY 90 tablet 3   fluticasone (FLONASE) 50 MCG/ACT nasal spray Place 2 sprays into both nostrils daily. 16 g 11   gabapentin (NEURONTIN) 100 MG capsule TAKE 2 CAPSULES BY MOUTH AT BEDTIME. 180 capsule 1   ketoconazole (NIZORAL) 2 % cream Apply topically daily.     Melatonin 5 MG TABS Take by mouth.  predniSONE (DELTASONE) 20 MG tablet Take 2 tablets (40 mg total) by mouth daily with breakfast. 10 tablet 0   rosuvastatin (CRESTOR) 10 MG tablet Take 1 tablet (10 mg total) by mouth daily. 90 tablet 3   SYMBICORT 160-4.5 MCG/ACT inhaler INHALE 2 PUFFS INTO THE LUNGS TWICE A DAY 10.2 each 12   tiZANidine (ZANAFLEX) 2 MG tablet Take 1 tablet (2 mg total) by mouth at bedtime. 30 tablet 0   No current facility-administered medications for this visit.     Objective:  BP 104/72   Pulse 61   Temp 97.8 F (36.6 C)   Ht 5' 7.5" (1.715 m)   Wt 184 lb (83.5 kg)   SpO2 98%   BMI 28.39 kg/m  Gen: NAD, resting comfortably TM normal, pharynx largely normal, nasal turbinates mildly erythematous CV: RRR no murmurs  rubs or gallops Lungs: CTAB no crackles, wheeze, rhonchi Ext: no edema Skin: warm, dry    Assessment and Plan   #fatigue/reflux/allergies- including headache S: Patient has upcoming out of country travel to [place] for 2 weeks and then will be in [place] rest of season and wanted to assess his health overall before leaving.  He reports some headaches (likely allergy symptoms)as well as worsening reflux in the past week.  Has also reported some generally not feeling well/exhaustion and some dry cough. In regards to allergies taking zyrtec 10 mg at night and taking flonase daily- has needed claritin in AM as well. Starting to feel better- we wonder if his prednisone has helped.   Started back on symbicort 10 days ago but not very regular- not having to use albuterol.   Somewhat better than last week when he called. Reports allergy issues as well as back/hip/IT issues- on some prednisone for that A/P: sounds like fatigue and headache issues are related to allergies and improving with prednisone for his back/hip issues he is seeing Dr. Tamala Julian for plus had injection- suspect he will continue to improve. Getting out of [place] during this season will likely help as well. See below about reflux  -asthma seems well controlled but I think taking symbicort regularly with upcoming hiking on trip makes sense- should have albuterol on hand too.   # GERD S:Medication: Has been on Dexilant in the past-presently on this and twic ea week still needing pepcid- feels like worse with allergies with post nasal drip A/P: sounds like worse with stress lately- im ok with sparing pepcid but if this does not calm by time gets back to [place] would follow up with Dr. Hilarie Fredrickson when he comes back to consider EGD    # Depression S: Medication: Lexapro 20 mg. Appointment in august with psychiatry and seeing his therapist every other week. Stress caring for 45 year old father with lewy body dementiawho is not doing well as well as  caring for his kids.     08/19/2021    8:16 AM 04/10/2021    2:46 PM 07/30/2020   11:09 AM  Depression screen PHQ 2/9  Decreased Interest 1 0 0  Down, Depressed, Hopeless 1 0 1  PHQ - 2 Score 2 0 1  Altered sleeping 0 1 0  Tired, decreased energy '2 2 1  '$ Change in appetite 0 0 0  Feeling bad or failure about yourself  0 0 0  Trouble concentrating 0 0 0  Moving slowly or fidgety/restless 0 0 0  Suicidal thoughts 0 0 0  PHQ-9 Score '4 3 2  '$ Difficult doing work/chores  Not difficult at all Not difficult at all Not difficult at all  A/P: Depression in full remission - continue current meds and has psychiatry and pschology/therapy follow up- for now will continue current meds but may tweak with psychiatry.    #Vitamin D deficiency S: Medication: none at moment Last vitamin D Lab Results  Component Value Date   VD25OH 84 11/27/2019   A/P: check D- may need supplement   #hyperlipidemia S: Medication:rosuvastatin 10 mg  Lab Results  Component Value Date   CHOL 154 03/30/2021   HDL 56 03/30/2021   LDLCALC 85 03/30/2021   LDLDIRECT 159.3 06/25/2011   TRIG 63 03/30/2021   CHOLHDL 2.8 03/30/2021   A/P: lipids with significant improvement on statin LDL down nealry 50%- cardiology was ok with current #s- continue current meds  Recommended follow up: Return in about 4 months (around 12/19/2021) for physical or sooner if needed.Schedule b4 you leave.  Lab/Order associations:   ICD-10-CM   1. Gastroesophageal reflux disease without esophagitis  K21.9     2. Major depressive disorder with single episode, in full remission ([place])  F32.5     3. Vitamin D deficiency  E55.9 VITAMIN D 25 Hydroxy (Vit-D Deficiency, Fractures)    4. High risk medication use  Z79.899 Vitamin B12    5. Hyperlipidemia, unspecified hyperlipidemia type  E78.5 CBC with Differential/Platelet    Comprehensive metabolic panel    TSH    6. Other fatigue  R53.83 CBC with Differential/Platelet    Comprehensive metabolic  panel    TSH      No orders of the defined types were placed in this encounter.   Return precautions advised.  Garret Reddish, MD

## 2021-09-10 ENCOUNTER — Other Ambulatory Visit: Payer: Self-pay | Admitting: Family Medicine

## 2021-09-24 ENCOUNTER — Other Ambulatory Visit: Payer: Self-pay | Admitting: Family Medicine

## 2021-10-12 ENCOUNTER — Encounter: Payer: Self-pay | Admitting: Family Medicine

## 2021-10-12 ENCOUNTER — Other Ambulatory Visit: Payer: Self-pay | Admitting: Family Medicine

## 2021-10-26 NOTE — Progress Notes (Unsigned)
George [name] [address] [place] Phone: [phone] Subjective:   George [name], am serving as a scribe for Dr. Hulan Saas.  I'm seeing this patient by the request  of:  Marin Olp, MD  CC: Knee pain, back pain and neck pain follow-up  [id number]  George [name] is a 56 y.o. male coming in with complaint of back and neck pain. OMT on 08/17/2021 Patient states feeling worse since last visit. Bothersome right knee and hip. Here for manipulation. Was wondering about maintenance. No new issues.   Medications patient has been prescribed: Prednisone gabapentin zanaflex          Reviewed prior external information including notes and imaging from previsou exam, outside providers and external EMR if available.   As well as notes that were available from care everywhere and other healthcare systems.  Past medical history, social, surgical and family history all reviewed in electronic medical record.  No pertanent information unless stated regarding to the chief complaint.   Past Medical History:  Diagnosis Date   Anxiety    Colon polyps    GERD (gastroesophageal reflux disease)    Seasonal allergies     Allergies  Allergen Reactions   Trazodone And Nefazodone     Syncopal episode      Review of Systems:  No headache, visual changes, nausea, vomiting, diarrhea, constipation, dizziness, abdominal pain, skin rash, fevers, chills, night sweats, weight loss, swollen lymph nodes, body aches, joint swelling, chest pain, shortness of breath, mood changes. POSITIVE muscle aches  Objective  Blood pressure 118/74, pulse 79, height '5\' 7"'$  (1.702 m), weight 185 lb (83.9 kg), SpO2 95 %.   General: No apparent distress alert and oriented x3 mood and affect normal, dressed appropriately.  HEENT: Pupils equal, extraocular movements intact  Respiratory: Patient's speak in full sentences and does not appear short of breath   Cardiovascular: No lower extremity edema, non tender, no erythema  MSK:  Back does have TTP on the right side  Positive FABER  Right knee does have some lateral tracking noted.  No significant instability.  Tender to palpation over the medial joint line.   Osteopathic findings  C2 flexed rotated and side bent right C6 flexed rotated and side bent left T3 extended rotated and side bent right inhaled rib T8 extended rotated and side bent left L2 flexed rotated and side bent right Sacrum right on right     Assessment and Plan:  Piriformis syndrome of right side Continues to have some difficulty of the hip and low back.  Patient has had injections in the piriformis previously we will continue to monitor.  Patient does have some BMI follow-up after.  We will continue to monitor as well.  Given home exercises.  Follow-up again 6-8 weeks   Right knee pain Discussed with nursing worsening symptoms we can consider advanced imaging.  Nothing about a centimeter loculated urology.  Increase activity slowly.  Follow-up again in 6 to 8 weeks.    Nonallopathic problems  Decision today to treat with OMT was based on Physical Exam  After verbal consent patient was treated with HVLA, ME, FPR techniques in cervical, rib, thoracic, lumbar, and sacral  areas  Patient tolerated the procedure well with improvement in symptoms  Patient given exercises, stretches and lifestyle modifications  See medications in patient instructions if given  Patient will follow up in 4-8 weeks    The above documentation has been reviewed and  is accurate and complete [name], DO          Note: This dictation was prepared with Dragon dictation along with smaller phrase technology. Any transcriptional errors that result from this process are unintentional.

## 2021-10-27 ENCOUNTER — Ambulatory Visit: Payer: No Typology Code available for payment source | Admitting: Family Medicine

## 2021-10-27 VITALS — BP 118/74 | HR 79 | Ht 67.0 in | Wt 185.0 lb

## 2021-10-27 DIAGNOSIS — M9901 Segmental and somatic dysfunction of cervical region: Secondary | ICD-10-CM

## 2021-10-27 DIAGNOSIS — G5701 Lesion of sciatic nerve, right lower limb: Secondary | ICD-10-CM

## 2021-10-27 DIAGNOSIS — M9908 Segmental and somatic dysfunction of rib cage: Secondary | ICD-10-CM | POA: Diagnosis not present

## 2021-10-27 DIAGNOSIS — M9904 Segmental and somatic dysfunction of sacral region: Secondary | ICD-10-CM | POA: Diagnosis not present

## 2021-10-27 DIAGNOSIS — M9903 Segmental and somatic dysfunction of lumbar region: Secondary | ICD-10-CM

## 2021-10-27 DIAGNOSIS — G8929 Other chronic pain: Secondary | ICD-10-CM

## 2021-10-27 DIAGNOSIS — M9902 Segmental and somatic dysfunction of thoracic region: Secondary | ICD-10-CM

## 2021-10-27 DIAGNOSIS — M25561 Pain in right knee: Secondary | ICD-10-CM

## 2021-10-27 NOTE — Patient Instructions (Addendum)
Good to see you! Do prescribed exercises at least 3x a week

## 2021-10-27 NOTE — Assessment & Plan Note (Signed)
Discussed with nursing worsening symptoms we can consider advanced imaging.  Nothing about a centimeter loculated urology.  Increase activity slowly.  Follow-up again in 6 to 8 weeks.

## 2021-10-27 NOTE — Assessment & Plan Note (Signed)
Continues to have some difficulty of the hip and low back.  Patient has had injections in the piriformis previously we will continue to monitor.  Patient does have some BMI follow-up after.  We will continue to monitor as well.  Given home exercises.  Follow-up again 6-8 weeks

## 2021-10-29 ENCOUNTER — Encounter: Payer: Self-pay | Admitting: Family Medicine

## 2021-11-27 NOTE — Progress Notes (Unsigned)
George [name] George [name] Phone: [phone] Subjective:   George [name], am serving as a scribe for Dr. Hulan Saas.  I'm seeing this patient by the request  of:  Marin Olp, MD  CC: Neck, back pain follow-up  [id number]  George [name] is a 56 y.o. male coming in with complaint of back and neck pain. OMT 10/27/2021. Patient states that his R hip pain is still there. Patient has not been running so his knee pain has improved.  Has been swimming more frequently.  Just does not feel like he gets his heart rate up quite as much as when he is running.  Medications patient has been prescribed: Gabapentin, Zanaflex  Taking:         Reviewed prior external information including notes and imaging from previsou exam, outside providers and external EMR if available.   As well as notes that were available from care everywhere and other healthcare systems.  Past medical history, social, surgical and family history all reviewed in electronic medical record.  [name] pertanent information unless stated regarding to the chief complaint.   Past Medical History:  Diagnosis Date   Anxiety    Colon polyps    GERD (gastroesophageal reflux disease)    Seasonal allergies     Allergies  Allergen Reactions   Trazodone And Nefazodone     Syncopal episode      Review of Systems:  [name] headache, visual changes, nausea, vomiting, diarrhea, constipation, dizziness, abdominal pain, skin rash, fevers, chills, night sweats, weight loss, swollen lymph nodes, body aches, joint swelling, chest pain, shortness of breath, mood changes. POSITIVE muscle aches  Objective  Blood pressure 114/82, pulse 64, height '5\' 7"'$  (1.702 m), weight 182 lb (82.6 kg), SpO2 97 %.   General: [name] apparent distress alert and oriented x3 mood and affect normal, dressed appropriately.  HEENT: Pupils equal, extraocular movements intact  Respiratory: Patient's speak in  full sentences and does not appear short of breath  Cardiovascular: [name] lower extremity edema, non tender, [name] erythema  Gait typically normal MSK:  Back does have some loss of lordosis.  Tightness noted in the paraspinal musculature of the right side of the back.  Seems to be more at the L4-L5 in the sacroiliac joint.  Tightness with hamstring on the right compared to left as well as tightness with FABER test on the right compared to left.  Tightness of the hip flexor on the right side as well.  Osteopathic findings  C2 flexed rotated and side bent right C6 flexed rotated and side bent left T3 extended rotated and side bent right inhaled rib T9 extended rotated and side bent left L2 flexed rotated and side bent right L5 flexed rotated and side bent right Sacrum right on right       Assessment and Plan:  Lumbar radiculopathy Concern that some of the piriformis syndromes that patient is having could be secondary to more of a lumbar radiculopathy.  We discussed again about the possibility of repeating imaging.  X-rays were from 2017 of the back.  Would like to get them to further evaluate for any progression in the sacralization of L5 previously.  Patient does respond extremely well though to osteopathic manipulation.  Discussed which activities to do and which ones to avoid.  Worsening radicular symptoms may need to consider the possibility of advanced imaging.  Follow-up again 6 to 8 weeks.    Nonallopathic problems  Decision  today to treat with OMT was based on Physical Exam  After verbal consent patient was treated with HVLA, ME, FPR techniques in cervical, rib, thoracic, lumbar, and sacral  areas  Patient tolerated the procedure well with improvement in symptoms  Patient given exercises, stretches and lifestyle modifications  See medications in patient instructions if given  Patient will follow up in 4-8 weeks     The above documentation has been reviewed and is accurate  and complete [name], DO         Note: This dictation was prepared with Dragon dictation along with smaller phrase technology. Any transcriptional errors that result from this process are unintentional.

## 2021-12-02 ENCOUNTER — Ambulatory Visit: Payer: No Typology Code available for payment source | Admitting: Family Medicine

## 2021-12-02 VITALS — BP 114/82 | HR 64 | Ht 67.0 in | Wt 182.0 lb

## 2021-12-02 DIAGNOSIS — M9908 Segmental and somatic dysfunction of rib cage: Secondary | ICD-10-CM | POA: Diagnosis not present

## 2021-12-02 DIAGNOSIS — M9901 Segmental and somatic dysfunction of cervical region: Secondary | ICD-10-CM

## 2021-12-02 DIAGNOSIS — M9902 Segmental and somatic dysfunction of thoracic region: Secondary | ICD-10-CM | POA: Diagnosis not present

## 2021-12-02 DIAGNOSIS — M9903 Segmental and somatic dysfunction of lumbar region: Secondary | ICD-10-CM

## 2021-12-02 DIAGNOSIS — M545 Low back pain, unspecified: Secondary | ICD-10-CM

## 2021-12-02 DIAGNOSIS — M5416 Radiculopathy, lumbar region: Secondary | ICD-10-CM

## 2021-12-02 DIAGNOSIS — M9904 Segmental and somatic dysfunction of sacral region: Secondary | ICD-10-CM | POA: Diagnosis not present

## 2021-12-02 NOTE — Patient Instructions (Addendum)
1 min jog 1 min walk for 30 min  Congrats on awesome kids Xray lumbar at your leisure See me in 4-6 weeks

## 2021-12-02 NOTE — Assessment & Plan Note (Signed)
Concern that some of the piriformis syndromes that patient is having could be secondary to more of a lumbar radiculopathy.  We discussed again about the possibility of repeating imaging.  X-rays were from 2017 of the back.  Would like to get them to further evaluate for any progression in the sacralization of L5 previously.  Patient does respond extremely well though to osteopathic manipulation.  Discussed which activities to do and which ones to avoid.  Worsening radicular symptoms may need to consider the possibility of advanced imaging.  Follow-up again 6 to 8 weeks.

## 2021-12-07 ENCOUNTER — Encounter: Payer: Self-pay | Admitting: *Deleted

## 2021-12-20 ENCOUNTER — Other Ambulatory Visit: Payer: Self-pay | Admitting: Interventional Cardiology

## 2021-12-29 ENCOUNTER — Other Ambulatory Visit: Payer: Self-pay

## 2021-12-29 ENCOUNTER — Encounter: Payer: Self-pay | Admitting: Family Medicine

## 2021-12-29 MED ORDER — GABAPENTIN 100 MG PO CAPS: 200.0000 mg | ORAL_CAPSULE | Freq: Every day | ORAL | 1 refills | Status: DC

## 2022-01-04 NOTE — Progress Notes (Unsigned)
[place] Phone: [phone] Subjective:   George George [name], am serving as a scribe for Dr. Hulan Saas.  I'm seeing this patient by the request  of:  Marin Olp, MD  CC: Low back and knee pain  [id number]  George George [name] is a 56 y.o. male coming in with complaint of back and neck pain. OMT 12/02/2021. Patient states that his R hip seems to be improving but is painful at night.   Pain in R knee is not improving but less pain if he does not run.   Back is the same as last visit.   Medications patient has been prescribed: Gabapentin  Taking:         Reviewed prior external information including notes and imaging from previsou exam, outside providers and external EMR if available.   As well as notes that were available from care everywhere and other healthcare systems.  Past medical history, social, surgical and family history all reviewed in electronic medical record.  George [name] pertanent information unless stated regarding to the chief complaint.   Past Medical History:  Diagnosis Date   Anxiety    Colon polyps    GERD (gastroesophageal reflux disease)    Seasonal allergies     Allergies  Allergen Reactions   Trazodone And Nefazodone     Syncopal episode      Review of Systems:  George [name] headache, visual changes, nausea, vomiting, diarrhea, constipation, dizziness, abdominal pain, skin rash, fevers, chills, night sweats, weight loss, swollen lymph nodes, body aches, joint swelling, chest pain, shortness of breath, mood changes. POSITIVE muscle aches  Objective  Blood pressure 108/76, pulse 68, height '5\' 7"'$  (0.626 m), weight 185 lb (83.9 kg), SpO2 99 %.   General: George [name] apparent distress alert and oriented x3 mood and affect normal, dressed appropriately.  HEENT: Pupils equal, extraocular movements intact  Respiratory: Patient's speak in full sentences and does not appear short of breath   Cardiovascular: George [name] lower extremity edema, non tender, George [name] erythema  Gait relatively normal. Very mild tenderness to palpation over the medial joint line.  Mild McMurray's noted.  George [name] crepitus noted with grind test. MSK:  Back low back does have some loss of lordosis.  Patient does have some tenderness to palpation in the paraspinal musculature mostly around the sacroiliac joint.  Patient does have tightness noted in this area.  Negative straight leg test but tightness with the hamstring noted.  Osteopathic findings  C2 flexed rotated and side bent right C6 flexed rotated and side bent left T3 extended rotated and side bent right inhaled rib T9 extended rotated and side bent left L2 flexed rotated and side bent right Sacrum right on right       Assessment and Plan:  Right knee pain Patient does have some tenderness still noted.  Likely some underlying arthritic changes and some lateral tracking of the patella.  Will discuss with patient about icing regimen and home exercises.  We could consider the possibility of further imaging if patient has any increasing instability.  Patient will increase activity slowly otherwise.  Follow-up again in 6 to 8 weeks.  Lumbar radiculopathy Continues to have tightness but likely George [name] significant radicular symptoms.  Does have Zanaflex 2 mg at night for any exacerbations, gabapentin 300 mg at bedtime as well.  Patient is doing well and working on his core.  Follow-up with me again in 6 to 8 weeks.  We will further  evaluate.  Has responded to osteopathic manipulation in the past x-rays ordered today to further evaluate if any other bony abnormality could be contributing.    Nonallopathic problems  Decision today to treat with OMT was based on Physical Exam  After verbal consent patient was treated with HVLA, ME, FPR techniques in cervical, rib, thoracic, lumbar, and sacral  areas  Patient tolerated the procedure well with improvement in  symptoms  Patient given exercises, stretches and lifestyle modifications  See medications in patient instructions if given  Patient will follow up in 4-8 weeks     The above documentation has been reviewed and is accurate and complete George Pulley, DO         Note: This dictation was prepared with Dragon dictation along with smaller phrase technology. Any transcriptional errors that result from this process are unintentional.

## 2022-01-05 ENCOUNTER — Encounter: Payer: Self-pay | Admitting: Family Medicine

## 2022-01-06 ENCOUNTER — Other Ambulatory Visit: Payer: Self-pay

## 2022-01-06 ENCOUNTER — Ambulatory Visit: Payer: No Typology Code available for payment source | Admitting: Family Medicine

## 2022-01-06 ENCOUNTER — Ambulatory Visit (INDEPENDENT_AMBULATORY_CARE_PROVIDER_SITE_OTHER): Payer: No Typology Code available for payment source

## 2022-01-06 ENCOUNTER — Encounter: Payer: Self-pay | Admitting: Family Medicine

## 2022-01-06 VITALS — BP 108/76 | HR 68 | Ht 67.0 in | Wt 185.0 lb

## 2022-01-06 DIAGNOSIS — M9903 Segmental and somatic dysfunction of lumbar region: Secondary | ICD-10-CM

## 2022-01-06 DIAGNOSIS — G8929 Other chronic pain: Secondary | ICD-10-CM

## 2022-01-06 DIAGNOSIS — M25561 Pain in right knee: Secondary | ICD-10-CM | POA: Diagnosis not present

## 2022-01-06 DIAGNOSIS — J4541 Moderate persistent asthma with (acute) exacerbation: Secondary | ICD-10-CM

## 2022-01-06 DIAGNOSIS — M545 Low back pain, unspecified: Secondary | ICD-10-CM

## 2022-01-06 DIAGNOSIS — M9902 Segmental and somatic dysfunction of thoracic region: Secondary | ICD-10-CM | POA: Diagnosis not present

## 2022-01-06 DIAGNOSIS — M9904 Segmental and somatic dysfunction of sacral region: Secondary | ICD-10-CM | POA: Diagnosis not present

## 2022-01-06 DIAGNOSIS — M9901 Segmental and somatic dysfunction of cervical region: Secondary | ICD-10-CM

## 2022-01-06 DIAGNOSIS — M9908 Segmental and somatic dysfunction of rib cage: Secondary | ICD-10-CM

## 2022-01-06 DIAGNOSIS — M5416 Radiculopathy, lumbar region: Secondary | ICD-10-CM

## 2022-01-06 MED ORDER — ROSUVASTATIN CALCIUM 10 MG PO TABS: 10.0000 mg | ORAL_TABLET | Freq: Every day | ORAL | 0 refills | Status: DC

## 2022-01-06 MED ORDER — BUDESONIDE-FORMOTEROL FUMARATE 160-4.5 MCG/ACT IN AERO: INHALATION_SPRAY | RESPIRATORY_TRACT | 12 refills | Status: DC

## 2022-01-06 NOTE — Patient Instructions (Signed)
Yoga wheel  Back and knee xray See me again in 5-6 weeks

## 2022-01-06 NOTE — Assessment & Plan Note (Addendum)
Continues to have tightness but likely no significant radicular symptoms.  Does have Zanaflex 2 mg at night for any exacerbations, gabapentin 300 mg at bedtime as well.  Patient is doing well and working on his core.  Follow-up with me again in 6 to 8 weeks.  We will further evaluate.  Has responded to osteopathic manipulation in the past x-rays ordered today to further evaluate if any other bony abnormality could be contributing.

## 2022-01-06 NOTE — Assessment & Plan Note (Signed)
Patient does have some tenderness still noted.  Likely some underlying arthritic changes and some lateral tracking of the patella.  Will discuss with patient about icing regimen and home exercises.  We could consider the possibility of further imaging if patient has any increasing instability.  Patient will increase activity slowly otherwise.  Follow-up again in 6 to 8 weeks.

## 2022-01-07 ENCOUNTER — Telehealth: Payer: Self-pay | Admitting: Family Medicine

## 2022-01-07 NOTE — Telephone Encounter (Signed)
..  Type of form received:Marketing executive Form   Additional comments:   Received by: patient drop off   Form should be Faxed to: [phone]  Form should be mailed to:  na   Is patient requesting call for pickup: yes    Form placed:  dr hunters folder   Attach charge sheet.  Yes   Individual made aware of 3-5 business day turn around (Y/N)?   Yes

## 2022-01-07 NOTE — Telephone Encounter (Signed)
Noted  

## 2022-01-11 ENCOUNTER — Other Ambulatory Visit: Payer: Self-pay | Admitting: Family Medicine

## 2022-02-15 ENCOUNTER — Other Ambulatory Visit: Payer: Self-pay | Admitting: Family Medicine

## 2022-02-15 NOTE — Progress Notes (Unsigned)
[name] [place] Phone: [phone] Subjective:   George [name], am serving as a scribe for Dr. Hulan Saas. I'm seeing this patient by the request  of:  Marin Olp, MD  CC: Back pain follow-up  [id number]  George [name] is a 56 y.o. male coming in with complaint of back and neck pain. OMT 01/06/2022. Also f/u for R knee pain. Patient states here for routine OMT. Patient stopped running so the knee has been doing alright.   Medications patient has been prescribed:   Taking:  Xray R knee 01/06/2022 IMPRESSION: Trace quadriceps tendon enthesophyte. Otherwise unremarkable radiographic appearance of the right knee.  Xray Lumbar 01/06/2022 IMPRESSION: 1. Mild degenerative disc disease at L2-L3 and L3-L4. 2. Minimal L4-L5 facet hypertrophy.       Reviewed prior external information including notes and imaging from previsou exam, outside providers and external EMR if available.   As well as notes that were available from care everywhere and other healthcare systems.  Past medical history, social, surgical and family history all reviewed in electronic medical record.  No pertanent information unless stated regarding to the chief complaint.   Past Medical History:  Diagnosis Date   Anxiety    Colon polyps    GERD (gastroesophageal reflux disease)    Seasonal allergies     Allergies  Allergen Reactions   Trazodone And Nefazodone     Syncopal episode      Review of Systems:  No headache, visual changes, nausea, vomiting, diarrhea, constipation, dizziness, abdominal pain, skin rash, fevers, chills, night sweats, weight loss, swollen lymph nodes, body aches, joint swelling, chest pain, shortness of breath, mood changes. POSITIVE muscle aches  Objective  Blood pressure 130/82, pulse 75, height '5\' 7"'$  (1.702 m), weight 187 lb (84.8 kg), SpO2 95 %.   General: No apparent distress alert and oriented  x3 mood and affect normal, dressed appropriately.  HEENT: Pupils equal, extraocular movements intact  Respiratory: Patient's speak in full sentences and does not appear short of breath  Cardiovascular: No lower extremity edema, non tender, no erythema  Low back exam does have some loss of lordosis.  Some tenderness to palpation in the paraspinal musculature.  Some tenderness in the parascapular region as well.  Still has tenderness over the knee.  Seems to be over the quadricep tendon approximately 2 cm proximal to the insertion on the patella.  Osteopathic findings  C2 flexed rotated and side bent right C6 flexed rotated and side bent left T3 extended rotated and side bent right inhaled rib T9 extended rotated and side bent left L1 flexed rotated and side bent right L5 flexed rotated and side bent left Sacrum right on right       Assessment and Plan:  Calcific tendinitis of lower leg, right Quadricep tendinitis.  Will send patient to shockwave therapy.  See if this will be beneficial to allow him to start being able to run again at some point.  Poor posture Continues to have poor posture and does respond well though to osteopathic manipulation.  Has been working more frequently and having to travel that is likely contributing to some of the discomfort and pain more recently.  Follow-up again in 6 to 8 weeks.    Nonallopathic problems  Decision today to treat with OMT was based on Physical Exam  After verbal consent patient was treated with HVLA, ME, FPR techniques in cervical, rib, thoracic, lumbar, and sacral  areas  Patient tolerated the procedure well with improvement in symptoms  Patient given exercises, stretches and lifestyle modifications  See medications in patient instructions if given  Patient will follow up in 4-8 weeks      The above documentation has been reviewed and is accurate and complete [name], DO        Note: This dictation was  prepared with Dragon dictation along with smaller phrase technology. Any transcriptional errors that result from this process are unintentional.

## 2022-02-17 ENCOUNTER — Ambulatory Visit: Payer: No Typology Code available for payment source | Admitting: Family Medicine

## 2022-02-17 ENCOUNTER — Encounter: Payer: Self-pay | Admitting: Family Medicine

## 2022-02-17 VITALS — BP 130/82 | HR 75 | Ht 67.0 in | Wt 187.0 lb

## 2022-02-17 DIAGNOSIS — R293 Abnormal posture: Secondary | ICD-10-CM

## 2022-02-17 DIAGNOSIS — M65261 Calcific tendinitis, right lower leg: Secondary | ICD-10-CM

## 2022-02-17 DIAGNOSIS — M9904 Segmental and somatic dysfunction of sacral region: Secondary | ICD-10-CM

## 2022-02-17 DIAGNOSIS — M9901 Segmental and somatic dysfunction of cervical region: Secondary | ICD-10-CM

## 2022-02-17 DIAGNOSIS — M9902 Segmental and somatic dysfunction of thoracic region: Secondary | ICD-10-CM | POA: Diagnosis not present

## 2022-02-17 DIAGNOSIS — M9908 Segmental and somatic dysfunction of rib cage: Secondary | ICD-10-CM

## 2022-02-17 DIAGNOSIS — M9903 Segmental and somatic dysfunction of lumbar region: Secondary | ICD-10-CM

## 2022-02-17 NOTE — Assessment & Plan Note (Signed)
Quadricep tendinitis.  Will send patient to shockwave therapy.  See if this will be beneficial to allow him to start being able to run again at some point.

## 2022-02-17 NOTE — Assessment & Plan Note (Signed)
Continues to have poor posture and does respond well though to osteopathic manipulation.  Has been working more frequently and having to travel that is likely contributing to some of the discomfort and pain more recently.  Follow-up again in 6 to 8 weeks.

## 2022-02-17 NOTE — Patient Instructions (Addendum)
Good to see you  Schedule shock wave therapy up front Continue to stay active Follow up in 6-8 weeks

## 2022-02-19 ENCOUNTER — Encounter: Payer: Self-pay | Admitting: Family Medicine

## 2022-02-23 ENCOUNTER — Encounter: Payer: Self-pay | Admitting: Internal Medicine

## 2022-02-25 ENCOUNTER — Encounter: Payer: Self-pay | Admitting: *Deleted

## 2022-03-09 ENCOUNTER — Encounter: Payer: Self-pay | Admitting: Family Medicine

## 2022-03-09 ENCOUNTER — Other Ambulatory Visit: Payer: Self-pay

## 2022-03-09 MED ORDER — ESCITALOPRAM OXALATE 20 MG PO TABS: 20.0000 mg | ORAL_TABLET | Freq: Every day | ORAL | 0 refills | Status: DC

## 2022-03-18 ENCOUNTER — Ambulatory Visit (INDEPENDENT_AMBULATORY_CARE_PROVIDER_SITE_OTHER): Payer: Self-pay | Admitting: Family Medicine

## 2022-03-18 DIAGNOSIS — M65261 Calcific tendinitis, right lower leg: Secondary | ICD-10-CM

## 2022-03-18 NOTE — Patient Instructions (Signed)
Thank you for coming in today.   Return in 1 week for next session.   OK to continue exercises and stretching.

## 2022-03-18 NOTE — Progress Notes (Signed)
   [name] [place] Phone: [phone]   Extracorporeal Shockwave Therapy Note    Patient is being treated today with ECSWT. Informed consent was obtained and patient tolerated procedure well.   Therapy performed by Lynne Leader  Condition treated: Quad tendonitis Treatment preset used: Calcific shoulder tendinitis Energy used: 90 mJ Frequency used: 10 Hz Number of pulses: 2000 Treatment #1 of #3-4  Electronically signed by:  [name] 11:54 AM 03/18/22

## 2022-03-25 ENCOUNTER — Ambulatory Visit (INDEPENDENT_AMBULATORY_CARE_PROVIDER_SITE_OTHER): Payer: Self-pay | Admitting: Family Medicine

## 2022-03-25 DIAGNOSIS — M65261 Calcific tendinitis, right lower leg: Secondary | ICD-10-CM

## 2022-03-25 NOTE — Progress Notes (Signed)
   [name] [place] Phone: [phone]   Extracorporeal Shockwave Therapy Note    Patient is being treated today with ECSWT. Informed consent was obtained and patient tolerated procedure well.   Therapy performed by Lynne Leader  Condition treated: Quadricep tendinitis Treatment preset used: Calcific shoulder tendinitis Energy used: 90 mJ Frequency used: 10 Hz Number of pulses: 2000 Treatment #2 of #3-4  Electronically signed by:  George [name] [facility] 9:04 AM 03/25/22

## 2022-03-29 NOTE — Progress Notes (Signed)
[place] Phone: [phone] Subjective:   George [name], am serving as a scribe for Dr. Hulan Saas.  I'm seeing this patient by the request  of:  Marin Olp, MD  CC: Knee pain, leg pain, back pain  [id number]  George [name] is a 57 y.o. male coming in with complaint of back and neck pain. OMT 02/17/2022. Has seen Dr. Georgina Snell for shockwave to quad tendon. Patient states that he had shockwave with corey today and it has not helped as much as the first round he had. Patient states back pain is doing relatively well.  Has had some neck pain.  Traveling for some of the holidays.  To be something that can aggravate it from time to time  Medications patient has been prescribed: Gabapentin  Taking:Gabapentin          Reviewed prior external information including notes and imaging from previsou exam, outside providers and external EMR if available.   As well as notes that were available from care everywhere and other healthcare systems.  Past medical history, social, surgical and family history all reviewed in electronic medical record.  [name] pertanent information unless stated regarding to the chief complaint.   Past Medical History:  Diagnosis Date   Anxiety    Colon polyps    GERD (gastroesophageal reflux disease)    Seasonal allergies     Allergies  Allergen Reactions   Trazodone And Nefazodone     Syncopal episode      Review of Systems:  [name] headache, visual changes, nausea, vomiting, diarrhea, constipation, dizziness, abdominal pain, skin rash, fevers, chills, night sweats, weight loss, swollen lymph nodes, , joint swelling, chest pain, shortness of breath, mood changes. POSITIVE muscle aches, body aches  Objective  Blood pressure 130/80, pulse 68, height '5\' 7"'$  (1.702 m), weight 187 lb (84.8 kg), SpO2 98 %.   General: [name] apparent distress alert and oriented x3 mood and affect normal,  dressed appropriately.  HEENT: Pupils equal, extraocular movements intact  Respiratory: Patient's speak in full sentences and does not appear short of breath  Cardiovascular: [name] lower extremity edema, non tender, [name] erythema  Right knee exam does have some good range of motion noted.  Mildly tender more over the superior patella than anywhere else.  Mild crepitus with range of motion.  Limited muscular skeletal ultrasound was performed and interpreted by Hulan Saas, M  Limited ultrasound of patient's knee shows some very mild calcific changes within the distal quadricep tendon noted.  [name] increase in Doppler flow or neovascularization.  Osteopathic findings  C6 flexed rotated and side bent left T3 extended rotated and side bent right inhaled rib T8 extended rotated and side bent left L3 flexed rotated and side bent right Sacrum right on right       Assessment and Plan:  Calcific tendinitis of lower leg, right Discussed with patient to continue another round and see if this will be beneficial.  Patient will increase activity slowly otherwise.  Follow-up with me again in 6 to 8 weeks  Lumbar radiculopathy Likely [name] radicular symptoms.  Doing well with current core strengthening.  Discussed icing regimen and home exercises.  Follow-up again in 6 to 8 weeks    Nonallopathic problems  Decision today to treat with OMT was based on Physical Exam  After verbal consent patient was treated with HVLA, ME, FPR techniques in cervical, rib, thoracic, lumbar, and sacral  areas  Patient tolerated the procedure well with improvement in symptoms  Patient given exercises, stretches and lifestyle modifications  See medications in patient instructions if given  Patient will follow up in 4-8 weeks      The above documentation has been reviewed and is accurate and complete [name], DO        Note: This dictation was prepared with Dragon dictation along with smaller phrase  technology. Any transcriptional errors that result from this process are unintentional.

## 2022-04-01 ENCOUNTER — Ambulatory Visit: Payer: Self-pay

## 2022-04-01 ENCOUNTER — Ambulatory Visit: Payer: No Typology Code available for payment source | Admitting: Family Medicine

## 2022-04-01 ENCOUNTER — Ambulatory Visit (INDEPENDENT_AMBULATORY_CARE_PROVIDER_SITE_OTHER): Payer: Self-pay | Admitting: Family Medicine

## 2022-04-01 VITALS — BP 130/80 | HR 68 | Ht 67.0 in | Wt 187.0 lb

## 2022-04-01 DIAGNOSIS — M5416 Radiculopathy, lumbar region: Secondary | ICD-10-CM | POA: Diagnosis not present

## 2022-04-01 DIAGNOSIS — M9901 Segmental and somatic dysfunction of cervical region: Secondary | ICD-10-CM | POA: Diagnosis not present

## 2022-04-01 DIAGNOSIS — M9903 Segmental and somatic dysfunction of lumbar region: Secondary | ICD-10-CM

## 2022-04-01 DIAGNOSIS — M79604 Pain in right leg: Secondary | ICD-10-CM | POA: Diagnosis not present

## 2022-04-01 DIAGNOSIS — M65261 Calcific tendinitis, right lower leg: Secondary | ICD-10-CM

## 2022-04-01 DIAGNOSIS — M9904 Segmental and somatic dysfunction of sacral region: Secondary | ICD-10-CM

## 2022-04-01 DIAGNOSIS — M9902 Segmental and somatic dysfunction of thoracic region: Secondary | ICD-10-CM

## 2022-04-01 DIAGNOSIS — M9908 Segmental and somatic dysfunction of rib cage: Secondary | ICD-10-CM

## 2022-04-01 NOTE — Assessment & Plan Note (Signed)
Discussed with patient to continue another round and see if this will be beneficial.  Patient will increase activity slowly otherwise.  Follow-up with me again in 6 to 8 weeks

## 2022-04-01 NOTE — Assessment & Plan Note (Signed)
Likely no radicular symptoms.  Doing well with current core strengthening.  Discussed icing regimen and home exercises.  Follow-up again in 6 to 8 weeks

## 2022-04-01 NOTE — Progress Notes (Signed)
   [name] [name] Phone: [phone]   Extracorporeal Shockwave Therapy Note    Patient is being treated today with ECSWT. Informed consent was obtained and patient tolerated procedure well.   Therapy performed by Lynne Leader  Condition treated: Calcific tendinitis quad tendon right knee. Treatment preset used: Calcific tendinitis shoulder Energy used: 90 mJ Frequency used: 10 Hz Number of pulses: 2000 Treatment #3 of #3-6  Electronically signed by:  [name] 10:03 AM 04/01/22

## 2022-04-01 NOTE — Patient Instructions (Signed)
Good to see you   Follow up in  

## 2022-04-05 ENCOUNTER — Ambulatory Visit (AMBULATORY_SURGERY_CENTER): Payer: No Typology Code available for payment source | Admitting: *Deleted

## 2022-04-05 VITALS — Ht 68.0 in | Wt 175.0 lb

## 2022-04-05 DIAGNOSIS — Z86010 Personal history of colonic polyps: Secondary | ICD-10-CM

## 2022-04-05 MED ORDER — NA SULFATE-K SULFATE-MG SULF 17.5-3.13-1.6 GM/177ML PO SOLN: 1.0000 | Freq: Once | ORAL | 0 refills | Status: AC

## 2022-04-05 NOTE — Progress Notes (Signed)
No egg or soy allergy known to patient  No issues known to pt with past sedation with any surgeries or procedures Patient denies ever with being intubated No issues moving head or neck No issues with swallowing No FH of Malignant Hyperthermia Pt is not on diet pills Pt is not on  home 02  Pt is not on blood thinners  Pt denies issues with constipation  Pt is not on dialysis Pt denies any upcoming cardiac testing Pt encouraged to use to use Singlecare or Goodrx to reduce cost  Patient's chart reviewed by Osvaldo Angst CNRA prior to previsit and patient appropriate for the [place].  Previsit completed and red dot placed by patient's name on their procedure day (on provider's schedule).  . Visit by phone Instructions sent by mail and my chart

## 2022-04-08 ENCOUNTER — Ambulatory Visit (INDEPENDENT_AMBULATORY_CARE_PROVIDER_SITE_OTHER): Payer: Self-pay | Admitting: Family Medicine

## 2022-04-08 DIAGNOSIS — M65261 Calcific tendinitis, right lower leg: Secondary | ICD-10-CM

## 2022-04-08 NOTE — Patient Instructions (Signed)
Thank you for coming in today.   Recheck as needed.   Continue home exercises.   Doing a couch to [facility] program is a good idea.

## 2022-04-08 NOTE — Progress Notes (Signed)
   [facility] [place] Phone: [phone]   Extracorporeal Shockwave Therapy Note    Patient is being treated today with ECSWT. Informed consent was obtained and patient tolerated procedure well.   Therapy performed by Lynne Leader  Condition treated: Calcific tendinitis right quad tendon right knee. Treatment preset used: Calcific tendinitis shoulder Energy used: 90 mJ Frequency used: 10 Hz Number of pulses: 2000 Treatment #4 of #4-6  Electronically signed by:  [facility] 9:14 AM 04/08/22  Resume home exercise program and check back as needed.

## 2022-04-26 ENCOUNTER — Ambulatory Visit (INDEPENDENT_AMBULATORY_CARE_PROVIDER_SITE_OTHER): Payer: No Typology Code available for payment source | Admitting: Nurse Practitioner

## 2022-04-26 ENCOUNTER — Encounter: Payer: Self-pay | Admitting: Nurse Practitioner

## 2022-04-26 ENCOUNTER — Encounter: Payer: No Typology Code available for payment source | Admitting: Internal Medicine

## 2022-04-26 ENCOUNTER — Ambulatory Visit: Payer: No Typology Code available for payment source | Admitting: Nurse Practitioner

## 2022-04-26 VITALS — BP 126/70 | HR 73 | Ht 69.0 in | Wt 187.6 lb

## 2022-04-26 DIAGNOSIS — Z86010 Personal history of colonic polyps: Secondary | ICD-10-CM

## 2022-04-26 NOTE — Progress Notes (Signed)
   George [name] is followed by Dr. Hilarie Fredrickson for GERD and also colon polyps. At time of his last colonoscopy in 2018 two sessile serrated polyps were removed. A 5 year recall was recommended, he is schedule for it next month.   While colonoscopy was being scheduled over the phone a question came up about whether he also needed another EGD ( had both done the last time).  He was tentatively scheduled for both the EGD and colonoscopy but advised that he needed an appointment in the office prior to the procedures to discuss?   He has no upper GI symptoms. No history of Barrett's esophagus. He was not placed on an EGD recall list. He doesn't want an EGD unless it is needed. GERD is well controlled on Dexilant   Will cancel EGD. I will refund his co-pay for this visit since it was not necessary.   Past Medical History:  Diagnosis Date   Allergy    Anxiety    Asthma    with activity Mild   Colon polyps    GERD (gastroesophageal reflux disease)    Seasonal allergies     Past Surgical History:  Procedure Laterality Date   COLONOSCOPY     UPPER GASTROINTESTINAL ENDOSCOPY      Current Medications, Allergies, Family History and Social History were reviewed in [employer] record.     Current Outpatient Medications  Medication Sig Dispense Refill   budesonide-formoterol (SYMBICORT) 160-4.5 MCG/ACT inhaler INHALE 2 PUFFS INTO THE LUNGS TWICE A DAY 10.2 each 12   buPROPion (WELLBUTRIN XL) 150 MG 24 hr tablet TAKE 1 TABLET BY MOUTH EVERY MORNING AFTER BREAKFAST Oral for 30 Days     cetirizine (ZYRTEC) 10 MG tablet Take 10 mg by mouth at bedtime.     DEXILANT 60 MG capsule TAKE 1 CAPSULE BY MOUTH EVERY DAY 90 capsule 0   escitalopram (LEXAPRO) 20 MG tablet Take 1 tablet (20 mg total) by mouth daily. 10 tablet 0   fluticasone (FLONASE) 50 MCG/ACT nasal spray Place 2 sprays into both nostrils daily. 16 g 11   gabapentin (NEURONTIN) 100 MG capsule Take 2 capsules (200 mg total) by mouth at  bedtime. 180 capsule 1   Melatonin 5 MG TABS Take by mouth.     rosuvastatin (CRESTOR) 10 MG tablet TAKE 1 TABLET (10 MG TOTAL) BY MOUTH DAILY. PLEASE MAKE YEARLY APPOINTMENT FOR FUTURE REFILLS. THANK YOU 90 tablet 0   tiZANidine (ZANAFLEX) 2 MG tablet TAKE 1 TABLET BY MOUTH EVERYDAY AT BEDTIME (Patient not taking: Reported on 04/05/2022) 90 tablet 1   No current facility-administered medications for this visit.     BP 126/70   Pulse 73   Ht '5\' 9"'$  (1.753 m)   Wt 187 lb 9.6 oz (85.1 kg)   SpO2 96%   BMI 27.70 kg/m  Constitutional:  Pleasant, generally well appearing male in no acute distress. Psychiatric: Normal mood and affect. Behavior is normal. EENT: Pupils normal.  Conjunctivae are normal. No scleral icterus. Neck supple.  Cardiovascular: Normal rate, regular rhythm.  Pulmonary/chest: Effort normal and breath sounds normal. No wheezing, rales or rhonchi. Abdominal: Soft, nondistended, nontender. Bowel sounds active throughout. There are no masses palpable. No hepatomegaly.  Tye Savoy, NP  04/26/2022, 1:32 PM

## 2022-04-26 NOTE — Patient Instructions (Signed)
   Due to recent changes in healthcare laws, you may see the results of your imaging and laboratory studies on MyChart before your provider has had a chance to review them.  We understand that in some cases there may be results that are confusing or concerning to you. Not all laboratory results come back in the same time frame and the provider may be waiting for multiple results in order to interpret others.  Please give [place] 48 hours in order for your provider to thoroughly review all the results before contacting the office for clarification of your results.    Sorry for your inconvenience today.   I appreciate the opportunity to care for you. Tye Savoy, NP-C

## 2022-05-03 ENCOUNTER — Other Ambulatory Visit: Payer: Self-pay | Admitting: Family Medicine

## 2022-05-03 ENCOUNTER — Encounter: Payer: Self-pay | Admitting: Family Medicine

## 2022-05-03 ENCOUNTER — Ambulatory Visit (INDEPENDENT_AMBULATORY_CARE_PROVIDER_SITE_OTHER): Payer: No Typology Code available for payment source | Admitting: Family Medicine

## 2022-05-03 VITALS — BP 120/88 | HR 67 | Temp 97.3°F | Ht 69.0 in | Wt 189.2 lb

## 2022-05-03 DIAGNOSIS — D225 Melanocytic nevi of trunk: Secondary | ICD-10-CM | POA: Insufficient documentation

## 2022-05-03 DIAGNOSIS — Z125 Encounter for screening for malignant neoplasm of prostate: Secondary | ICD-10-CM | POA: Diagnosis not present

## 2022-05-03 DIAGNOSIS — E538 Deficiency of other specified B group vitamins: Secondary | ICD-10-CM

## 2022-05-03 DIAGNOSIS — I700 Atherosclerosis of aorta: Secondary | ICD-10-CM | POA: Diagnosis not present

## 2022-05-03 DIAGNOSIS — E785 Hyperlipidemia, unspecified: Secondary | ICD-10-CM

## 2022-05-03 DIAGNOSIS — Z0000 Encounter for general adult medical examination without abnormal findings: Secondary | ICD-10-CM

## 2022-05-03 DIAGNOSIS — L719 Rosacea, unspecified: Secondary | ICD-10-CM | POA: Insufficient documentation

## 2022-05-03 DIAGNOSIS — Z23 Encounter for immunization: Secondary | ICD-10-CM

## 2022-05-03 DIAGNOSIS — L821 Other seborrheic keratosis: Secondary | ICD-10-CM | POA: Insufficient documentation

## 2022-05-03 DIAGNOSIS — F325 Major depressive disorder, single episode, in full remission: Secondary | ICD-10-CM

## 2022-05-03 DIAGNOSIS — D224 Melanocytic nevi of scalp and neck: Secondary | ICD-10-CM | POA: Insufficient documentation

## 2022-05-03 DIAGNOSIS — E559 Vitamin D deficiency, unspecified: Secondary | ICD-10-CM

## 2022-05-03 DIAGNOSIS — J4541 Moderate persistent asthma with (acute) exacerbation: Secondary | ICD-10-CM

## 2022-05-03 LAB — LIPID PANEL
Cholesterol: 165 mg/dL (ref 0–200)
HDL: 48.8 mg/dL (ref >39.00)
LDL Cholesterol: 98 mg/dL (ref 0–99)
NonHDL: 116.56
Total CHOL/HDL Ratio: 3
Triglycerides: 93 mg/dL (ref 0.0–149.0)
VLDL: 18.6 mg/dL (ref 0.0–40.0)

## 2022-05-03 LAB — COMPREHENSIVE METABOLIC PANEL
ALT: 22 U/L (ref 0–53)
AST: 22 U/L (ref 0–37)
Albumin: 4.3 g/dL (ref 3.5–5.2)
Alkaline Phosphatase: 75 U/L (ref 39–117)
BUN: 29 mg/dL — ABNORMAL HIGH (ref 6–23)
CO2: 29 mEq/L (ref 19–32)
Calcium: 9.5 mg/dL (ref 8.4–10.5)
Chloride: 103 mEq/L (ref 96–112)
Creatinine, Ser: 1.21 mg/dL (ref 0.40–1.50)
GFR: 67.16 mL/min (ref >60.00)
Glucose, Bld: 83 mg/dL (ref 70–99)
Potassium: 4.3 mEq/L (ref 3.5–5.1)
Sodium: 139 mEq/L (ref 135–145)
Total Bilirubin: 0.6 mg/dL (ref 0.2–1.2)
Total Protein: 7 g/dL (ref 6.0–8.3)

## 2022-05-03 LAB — CBC WITH DIFFERENTIAL/PLATELET
Basophils Absolute: 0.1 10*3/uL (ref 0.0–0.1)
Basophils Relative: 0.8 % (ref 0.0–3.0)
Eosinophils Absolute: 0.2 10*3/uL (ref 0.0–0.7)
Eosinophils Relative: 2.8 % (ref 0.0–5.0)
HCT: 44.1 % (ref 39.0–52.0)
Hemoglobin: 15.2 g/dL (ref 13.0–17.0)
Lymphocytes Relative: 30.3 % (ref 12.0–46.0)
Lymphs Abs: 2.1 10*3/uL (ref 0.7–4.0)
MCHC: 34.5 g/dL (ref 30.0–36.0)
MCV: 91.3 fl (ref 78.0–100.0)
Monocytes Absolute: 0.7 10*3/uL (ref 0.1–1.0)
Monocytes Relative: 10.5 % (ref 3.0–12.0)
Neutro Abs: 3.9 10*3/uL (ref 1.4–7.7)
Neutrophils Relative %: 55.6 % (ref 43.0–77.0)
Platelets: 243 10*3/uL (ref 150.0–400.0)
RBC: 4.83 Mil/uL (ref 4.22–5.81)
RDW: 12.9 % (ref 11.5–15.5)
WBC: 7.1 10*3/uL (ref 4.0–10.5)

## 2022-05-03 LAB — PSA: PSA: 1.2 ng/mL (ref 0.10–4.00)

## 2022-05-03 LAB — VITAMIN D 25 HYDROXY (VIT D DEFICIENCY, FRACTURES): VITD: 20.18 ng/mL — ABNORMAL LOW (ref 30.00–100.00)

## 2022-05-03 LAB — VITAMIN B12: Vitamin B-12: 271 pg/mL (ref 211–911)

## 2022-05-03 MED ORDER — VITAMIN D (ERGOCALCIFEROL) 1.25 MG (50000 UNIT) PO CAPS: 50000.0000 [IU] | ORAL_CAPSULE | ORAL | 0 refills | Status: DC

## 2022-05-03 MED ORDER — BUDESONIDE-FORMOTEROL FUMARATE 160-4.5 MCG/ACT IN AERO: INHALATION_SPRAY | RESPIRATORY_TRACT | 3 refills | Status: AC

## 2022-05-03 NOTE — Addendum Note (Signed)
Addended by: Marin Olp on: 05/03/2022 05:39 PM   Modules accepted: Level of Service

## 2022-05-03 NOTE — Addendum Note (Signed)
Addended by: Joanette Gula on: 05/03/2022 09:01 AM   Modules accepted: Orders

## 2022-05-03 NOTE — Progress Notes (Signed)
Phone: [phone]   Subjective:  Patient presents today for their annual physical. Chief complaint-noted.   See problem oriented charting- ROS- full  review of systems was completed and negative  except for: some wheeze and cough and sometimes phlegmy  The following were reviewed and entered/updated in epic: Past Medical History:  Diagnosis Date   Allergy    Anxiety    Asthma    with activity Mild   Colon polyps    GERD (gastroesophageal reflux disease)    Seasonal allergies    Patient Active Problem List   Diagnosis Date Noted   Aortic atherosclerosis ([place]) 04/10/2021    Priority: Medium    Mild sleep apnea 05/27/2020    Priority: Medium    GAD (generalized anxiety disorder) 03/17/2020    Priority: Medium    Major depressive disorder with single episode, in full remission ([place]) 03/17/2020    Priority: Medium    Hyperlipidemia, unspecified 07/03/2019    Priority: Medium    Vitamin D deficiency 03/21/2018    Priority: Medium    B12 deficiency 03/21/2018    Priority: Medium    Allergic rhinitis due to pollen 04/23/2015    Priority: Medium    Extrinsic asthma 08/11/2010    Priority: Medium    Insomnia 08/11/2010    Priority: Medium    GERD 12/08/2007    Priority: Medium    Irritable bowel syndrome 12/08/2007    Priority: Medium    Rosacea 05/03/2022    Priority: Low   Calcific tendinitis of lower leg, right 02/17/2022    Priority: 1.   Greater trochanteric bursitis of right hip 07/08/2021    Priority: 1.   Whiplash 12/09/2020    Priority: 1.   Foot contusion 12/09/2020    Priority: 1.   Right knee pain 06/04/2020    Priority: 1.   Hamstring tightness of right lower extremity 11/02/2019    Priority: 1.   Callus of foot 05/17/2019    Priority: 1.   Poor posture 12/22/2017    Priority: 1.   Capsulitis of metatarsophalangeal (MTP) joint of left foot 12/22/2017    Priority: 1.   Nonallopathic lesion of cervical region 10/28/2017    Priority: 1.    Nonallopathic lesion of rib cage 10/28/2017    Priority: 1.   Tibialis posterior tendinitis, right 08/30/2017    Priority: 1.   Extensor intersection syndrome of right wrist 02/24/2017    Priority: 1.   ECU (extensor carpi ulnaris), subluxation/dislocation, right, initial encounter 07/19/2016    Priority: 1.   Lumbar radiculopathy 08/14/2015    Priority: 1.   Nonallopathic lesion of thoracic region 05/19/2015    Priority: 1.   Nonallopathic lesion of lumbosacral region 05/19/2015    Priority: 1.   Nonallopathic lesion of sacral region 05/19/2015    Priority: 1.   Piriformis syndrome of right side 04/24/2015    Priority: 1.   Seborrheic keratoses 05/03/2022   Past Surgical History:  Procedure Laterality Date   COLONOSCOPY     UPPER GASTROINTESTINAL ENDOSCOPY      Family History  Problem Relation Age of Onset   Colon polyps Mother    Rectal cancer Father    Obesity Father    Asthma Father    Hypertension Father    Bladder Cancer Father    CAD Father        prior smoker. cabg in late 38s   Healthy Sister    Bipolar disorder Brother    Colon cancer Maternal  Grandmother    Diabetes Neg Hx    Early death Neg Hx    Heart disease Neg Hx    Hyperlipidemia Neg Hx    Kidney disease Neg Hx    Stroke Neg Hx    Esophageal cancer Neg Hx    Stomach cancer Neg Hx     Medications- reviewed and updated Current Outpatient Medications  Medication Sig Dispense Refill   buPROPion (WELLBUTRIN XL) 150 MG 24 hr tablet TAKE 1 TABLET BY MOUTH EVERY MORNING AFTER BREAKFAST Oral for 30 Days     cetirizine (ZYRTEC) 10 MG tablet Take 10 mg by mouth at bedtime.     DEXILANT 60 MG capsule TAKE 1 CAPSULE BY MOUTH EVERY DAY 90 capsule 0   escitalopram (LEXAPRO) 20 MG tablet Take 1 tablet (20 mg total) by mouth daily. 10 tablet 0   fluticasone (FLONASE) 50 MCG/ACT nasal spray Place 2 sprays into both nostrils daily. 16 g 11   gabapentin (NEURONTIN) 100 MG capsule Take 2 capsules (200 mg total) by  mouth at bedtime. 180 capsule 1   Melatonin 5 MG TABS Take by mouth.     rosuvastatin (CRESTOR) 10 MG tablet TAKE 1 TABLET (10 MG TOTAL) BY MOUTH DAILY. PLEASE MAKE YEARLY APPOINTMENT FOR FUTURE REFILLS. THANK YOU 90 tablet 0   budesonide-formoterol (SYMBICORT) 160-4.5 MCG/ACT inhaler INHALE 2 PUFFS INTO THE LUNGS TWICE A DAY 30.6 each 3   tiZANidine (ZANAFLEX) 2 MG tablet TAKE 1 TABLET BY MOUTH EVERYDAY AT BEDTIME (Patient not taking: Reported on 05/03/2022) 90 tablet 1   No current facility-administered medications for this visit.    Allergies-reviewed and updated Allergies  Allergen Reactions   Trazodone And Nefazodone     Syncopal episode     Social History   Social History Narrative   Married. 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       [place]: tae kwon do, exercise, movies, hiking   Objective  Objective:  BP 120/88 (BP Location: Left Arm, Patient Position: Sitting)   Pulse 67   Temp (!) 97.3 F (36.3 C) (Temporal)   Ht 5' 9"$  (1.753 m)   Wt 189 lb 3.2 oz (85.8 kg)   SpO2 98%   BMI 27.94 kg/m  Gen: NAD, resting comfortably HEENT: Mucous membranes are moist. Oropharynx normal Neck: no thyromegaly CV: RRR no murmurs rubs or gallops Lungs: CTAB no crackles, wheeze, rhonchi Abdomen: soft/nontender/nondistended/normal bowel sounds. No rebound or guarding.  Ext: no edema Skin: warm, dry Neuro: grossly normal, moves all extremities, PERRLA   Assessment and Plan  57 y.o. male presenting for annual physical.  Health Maintenance counseling: 1. Anticipatory guidance: Patient counseled regarding regular dental exams -q6 months, eye exams -yearly,  avoiding smoking and second hand smoke, limiting alcohol to 2 beverages per day - 1 a week, no illicit drugs.   2. Risk factor reduction:  Advised patient of need for regular exercise and diet rich and fruits and vegetables to reduce risk of heart attack and stroke.  Exercise- doing more swimming  for exercise- to take stress off knee in past but knee doing better and able to do some running.  6 days a week working out [employer] lbs up from last year- wants to work on bringing this back down- has started in last few weeks. Sweets are his issue and trying to cut back to events Wt Readings from Last 3 Encounters:  05/03/22 189 lb  3.2 oz (85.8 kg)  04/26/22 187 lb 9.6 oz (85.1 kg)  04/05/22 175 lb (79.4 kg)  3. Immunizations/screenings/ancillary studies- shingrix - will call back for nurse visit , states has flu and covid shot in fall, Tdap today  Immunization History  Administered Date(s) Administered   Influenza Split 01/27/2012, 12/13/2012, 12/13/2013   Influenza Whole 12/08/2007   Influenza,inj,Quad PF,6+ Mos 12/05/2014, 12/22/2017, 12/08/2018, 11/27/2019, 12/26/2020   Influenza-Unspecified 12/02/2021   PFIZER(Purple Top)SARS-COV-2 Vaccination 05/19/2019, 06/09/2019, 12/28/2019   Pfizer Covid-19 Vaccine Bivalent Booster 68yr & up 12/26/2020   Pneumococcal Polysaccharide-23 06/25/2011   Tdap 06/25/2011   Unspecified SARS-COV-2 Vaccination 12/02/2021   Zoster Recombinat (Shingrix) 02/22/2022   4. Prostate cancer screening-  trend psa with labs. Defer rectal for now unless concerning trend Lab Results  Component Value Date   PSA 0.60 12/05/2007   5. Colon cancer screening - scheduled in march with Dr. PHilarie Fredrickson, they did not feel needed EGD 6. Skin cancer screening- sees derm yearly when can- has been hard to get in. [place] derm he thinks. advised regular sunscreen use. Denies worrisome, changing, or new skin lesions.  7. Smoking associated screening (lung cancer screening, AAA screen 65-75, UA)- never smoker 8. STD screening - monogamous  Status of chronic or acute concerns   #social update- lewy body dementia with lewy body dementia, [place] in [place] ivy leagues as well  #hyperlipidemia-CT cardiac scoring 97.7 on 12/26/2018 81st  percentile for age #Aortic atherosclerosis S: Medication:Rosuvastatin 10 mg daily was recommended by Dr. BEmeterio ReeveLab Results  Component Value Date   CHOL 154 03/30/2021   HDL 56 03/30/2021   LDLCALC 85 03/30/2021   LDLDIRECT 159.3 06/25/2011   TRIG 63 03/30/2021   CHOLHDL 2.8 03/30/2021   A/P: lipids- update today- hed prefer to wait and chat with cards about any change in dose as long as LDL under 100  Aortic atherosclerosis (presumed stable)- LDL goal ideally <70 -continue current medications for now unless jump in lipids   # Asthma/allergies S: Maintenance Medication: Symbicort 160-4.5 mcg 2 puffs twice daily (up and down with consistency- sometimes does great, then may miss a week), Flonase 50 mg, Zyrtec 10 mg - cough and some wheeze a few months A/P: seems to be slightly worse lately- we discussed consistency with 2 puffs twice a day of symbicort and if not improving within a month then we could consider alternate treatments- short term if having flares could even do 3x a day OR 3 puffs twice a day    # B12 deficiency S: Current treatment/medication (oral vs. IM):  off right now Lab Results  Component Value Date   VITAMINB12 439 08/19/2021  A/P: update b12- can restart if trending down   # Anxiety/depression/insomnia S:Medication: Wellbutrin 150 mg extended release, Lexapro 20 mg, also uses melatonin for sleep Counseling: still in therapy    05/03/2022    8:22 AM 05/03/2022    8:20 AM 08/19/2021    8:16 AM  Depression screen PHQ 2/9  Decreased Interest 0 0 1  Down, Depressed, Hopeless 1 1 1  $ PHQ - 2 Score 1 1 2  $ Altered sleeping 1 1 0  Tired, decreased energy 0 0 2  Change in appetite 0 0 0  Feeling bad or failure about yourself  0 0 0  Trouble concentrating 0 0 0  Moving slowly or fidgety/restless 0 0 0  Suicidal thoughts 0 0 0  PHQ-9 Score 2 2 4  Difficult doing work/chores Not difficult at all Somewhat difficult Not difficult at all  A/P: anxiety imperfect but has  many stressors. Depression well controlled. Sleeping better.    # GERD S:Medication: Dexilant 60 mg. Not needing pepcid much anymore A/P: reasonable control- continue current medications     #Vitamin D deficiency S: Medication: none A/P: update today- determine if needs to restart   Recommended follow up: Return in about 1 year (around 05/04/2023) for physical or sooner if needed.Schedule b4 you leave. Future Appointments  Date Time Provider [place]  05/13/2022  9:30 AM [name], DO [facility] None  05/17/2022  2:00 PM Jettie Booze, MD [facility]  05/20/2022 11:00 AM Pyrtle, Lajuan Lines, MD [facility]   Lab/Order associations: fasting   ICD-10-CM   1. Preventative health care  Z00.00 Vitamin B12    CBC with Differential/Platelet    Comprehensive metabolic panel    Lipid panel    PSA    Vitamin D (25 hydroxy)    2. Aortic atherosclerosis ([facility])  I70.0     3. B12 deficiency  E53.8 Vitamin B12    4. Hyperlipidemia, unspecified hyperlipidemia type  E78.5 CBC with Differential/Platelet    Comprehensive metabolic panel    Lipid panel    5. Vitamin D deficiency  E55.9 Vitamin D (25 hydroxy)    6. Screening for prostate cancer  Z12.5 PSA    7. Major depressive disorder with single episode, in full remission ([place]) Chronic F32.5     8. Moderate persistent extrinsic asthma with acute exacerbation  J45.41 budesonide-formoterol (SYMBICORT) 160-4.5 MCG/ACT inhaler   Asthma exacerbation, worse at night and in am, improved with running. DDx allergic rhinitis, GERD, viral. Tx albuterol first.       Meds ordered this encounter  Medications   budesonide-formoterol (SYMBICORT) 160-4.5 MCG/ACT inhaler    Sig: INHALE 2 PUFFS INTO THE LUNGS TWICE A DAY    Dispense:  30.6 each    Refill:  3    DX Code Needed  .    Return precautions advised.  Garret Reddish, MD

## 2022-05-03 NOTE — Patient Instructions (Addendum)
Tdap today  Call back to schedule a nurse visit for Shingrix- or get at [facility] and let us know   Asthma seems to be slightly worse lately- we discussed consistency with 2 puffs twice a day of symbicort and if not improving within a month then we could consider alternate treatments (please reach out) - short term if having flares could even do 3x a day OR 3 puffs twice a day   Recommended follow up: Return in about 1 year (around 05/04/2023) for physical or sooner if needed.Schedule b4 you leave. -call if need a midway check or even sooner if needed such as if asthma not going in right direction

## 2022-05-06 ENCOUNTER — Encounter: Payer: Self-pay | Admitting: Internal Medicine

## 2022-05-10 ENCOUNTER — Ambulatory Visit: Payer: No Typology Code available for payment source | Admitting: Family Medicine

## 2022-05-10 VITALS — BP 122/84 | HR 69 | Ht 69.0 in | Wt 185.0 lb

## 2022-05-10 DIAGNOSIS — M9904 Segmental and somatic dysfunction of sacral region: Secondary | ICD-10-CM

## 2022-05-10 DIAGNOSIS — M25561 Pain in right knee: Secondary | ICD-10-CM | POA: Diagnosis not present

## 2022-05-10 DIAGNOSIS — G8929 Other chronic pain: Secondary | ICD-10-CM

## 2022-05-10 DIAGNOSIS — M9903 Segmental and somatic dysfunction of lumbar region: Secondary | ICD-10-CM

## 2022-05-10 DIAGNOSIS — R293 Abnormal posture: Secondary | ICD-10-CM | POA: Diagnosis not present

## 2022-05-10 DIAGNOSIS — M9901 Segmental and somatic dysfunction of cervical region: Secondary | ICD-10-CM

## 2022-05-10 DIAGNOSIS — M5416 Radiculopathy, lumbar region: Secondary | ICD-10-CM

## 2022-05-10 DIAGNOSIS — M9908 Segmental and somatic dysfunction of rib cage: Secondary | ICD-10-CM

## 2022-05-10 DIAGNOSIS — M9902 Segmental and somatic dysfunction of thoracic region: Secondary | ICD-10-CM

## 2022-05-10 NOTE — Assessment & Plan Note (Signed)
Stable at the moment.  Continue to monitor.

## 2022-05-10 NOTE — Assessment & Plan Note (Signed)
Patient has been traveling and has some difficulty with pressure.  Her could cause some mild increase in tightness.  It is starting to run more regularly and we discussed with her that this could have caused more exacerbation of the back noted.  Increase activity slowly otherwise.  Follow-up again in 6 to 8 weeks

## 2022-05-10 NOTE — Assessment & Plan Note (Signed)
Likely no radicular symptoms at this time but did have more tightness in the lower back.  Patient also has some tightness in the neck.  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  Patient has done relatively well with core strengthening though.  Discussed icing regimen and home exercises.  Follow-up again in 6 to 8 weeks

## 2022-05-10 NOTE — Progress Notes (Signed)
[facility]ach Tashana [facility] [place] [address] [place] Phone: [phone] Subjective:   [name], am serving as a scribe for Dr. Hulan Saas.  I'm seeing this patient by the request  of:  Marin Olp, MD  CC: Low back pain  [id number]  Chauncey Ahlgren is a 57 y.o. male coming in with complaint of back pain. LBP that has become more apparent over the last 3 weeks. Would like to be manipulated. No other concerns. Patient states though that it can affect him his daily activities.  Feels his knee is doing relatively well.  Has been trying to run occasionally.    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  Tobacco Use   Smoking status: Never   Smokeless tobacco: Never  Vaping Use   Vaping Use: Never used  Substance and Sexual Activity   Alcohol use: Yes    Comment: occasional    Drug use: No   Sexual activity: Yes    Comment: wife   Other Topics Concern   Not on file  Social History Narrative   Married. 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       [place]: tae kwon do, exercise, movies, hiking   Social Determinants of Health   Financial Resource Strain: Not on file  Food Insecurity: Not on file  Transportation Needs: Not on file  Physical Activity: Not on file  Stress: Not on file  Social Connections: Not on file   Allergies  Allergen Reactions   Trazodone  And Nefazodone     Syncopal episode    Family History  Problem Relation Age of Onset   Colon polyps Mother    Rectal cancer Father    Obesity Father    Asthma Father    Hypertension Father    Bladder Cancer Father    CAD Father        prior smoker. cabg in late 38s   Healthy Sister    Bipolar disorder Brother    Colon cancer Maternal Grandmother    Diabetes Neg Hx    Early death Neg Hx    Heart disease Neg Hx    Hyperlipidemia Neg Hx    Kidney disease Neg Hx    Stroke Neg Hx    Esophageal cancer Neg Hx    Stomach cancer Neg Hx      Current Outpatient Medications (Cardiovascular):    rosuvastatin (CRESTOR) 10 MG tablet, TAKE 1 TABLET (10 MG TOTAL) BY MOUTH DAILY. PLEASE MAKE YEARLY APPOINTMENT FOR FUTURE REFILLS. THANK YOU  Current Outpatient Medications (Respiratory):    budesonide-formoterol (SYMBICORT) 160-4.5 MCG/ACT inhaler, INHALE 2 PUFFS INTO THE LUNGS TWICE A DAY   cetirizine (ZYRTEC) 10 MG tablet, Take 10 mg  by mouth at bedtime.   fluticasone (FLONASE) 50 MCG/ACT nasal spray, Place 2 sprays into both nostrils daily.    Current Outpatient Medications (Other):    buPROPion (WELLBUTRIN XL) 150 MG 24 hr tablet, TAKE 1 TABLET BY MOUTH EVERY MORNING AFTER BREAKFAST Oral for 30 Days   DEXILANT 60 MG capsule, TAKE 1 CAPSULE BY MOUTH EVERY DAY   escitalopram (LEXAPRO) 20 MG tablet, Take 1 tablet (20 mg total) by mouth daily.   gabapentin (NEURONTIN) 100 MG capsule, Take 2 capsules (200 mg total) by mouth at bedtime.   Melatonin 5 MG TABS, Take by mouth.   Vitamin D, Ergocalciferol, (DRISDOL) 1.25 MG (50000 UNIT) CAPS capsule, Take 1 capsule (50,000 Units total) by mouth every 7 (seven) days.   Reviewed prior external information including notes and imaging from  primary care provider As well as notes that were available from care everywhere and other healthcare systems.  Past medical history, social, surgical and family history all reviewed in electronic medical  record.  No pertanent information unless stated regarding to the chief complaint.   Review of Systems:  No headache, visual changes, nausea, vomiting, diarrhea, constipation, dizziness, abdominal pain, skin rash, fevers, chills, night sweats, weight loss, swollen lymph nodes,  joint swelling, chest pain, shortness of breath, mood changes. POSITIVE muscle aches, body aches   Objective  Blood pressure 122/84, pulse 69, height '5\' 9"'$  (1.753 m), weight 185 lb (83.9 kg), SpO2 99 %.   General: No apparent distress alert and oriented x3 mood and affect normal, dressed appropriately.  HEENT: Pupils equal, extraocular movements intact  Respiratory: Patient's speak in full sentences and does not appear short of breath  Cardiovascular: No lower extremity edema, non tender, no erythema  Low back exam does have some loss of lordosis.  Some tenderness to palpation in the paraspinal musculature.  Osteopathic findings C2 flexed rotated and side bent right C4 flexed rotated and side bent left C6 flexed rotated and side bent left T3 extended rotated and side bent right inhaled third rib T9 extended rotated and side bent left L2 flexed rotated and side bent right Sacrum right on right     Impression and Recommendations:    The above documentation has been reviewed and is accurate and complete [name], DO

## 2022-05-13 ENCOUNTER — Ambulatory Visit: Payer: No Typology Code available for payment source | Admitting: Family Medicine

## 2022-05-16 NOTE — Progress Notes (Unsigned)
Cardiology Office Note   Date:  05/17/2022   ID:  George [name], DOB [birth year], MRN [id number]  PCP:  Marin Olp, MD    No chief complaint on file.  Coronary artery calcification  Wt Readings from Last 3 Encounters:  05/17/22 186 lb 6.4 oz (84.6 kg)  05/10/22 185 lb (83.9 kg)  05/03/22 189 lb 3.2 oz (85.8 kg)       History of Present Illness: George [name] is a 57 y.o. male who I first saw in 2022 due to syncope.  His father had bypass surgery in his late 2s.  The patient had coronary calcification noted on a calcium scoring CT scan.  12/2020: "Coronary arteries: Normal origins.   Coronary Calcium Score:   Left main: 0.501   Left anterior descending artery: 77.4   Left circumflex artery: 0   Right coronary artery: 19.8   Total: 97.7   Percentile: 81st   Pericardium: Normal.   Ascending Aorta: Normal caliber.  Atherosclerosis of the aorta.   Non-cardiac: See separate report from [facility].   IMPRESSION: Coronary calcium score of 97.7. This was 81st percentile for age-, race-, and sex-matched controls.   Atherosclerosis of the aorta."  In 11/2020: "He has had vasovagal episodes in the past since childhood.  No particular trigger for except rough airplane rides, and with fasting on Yom Kippur.This episode was different than the vagal episode.    Since this episode, he has felt fatigued.  Allergies have been worse.  Stress increased with work and his wife's back problem.  No further syncope or dizziness."  Running and swimming.  Does some weights.    Denies : Chest pain. Dizziness. Leg edema. Nitroglycerin use. Orthopnea. Palpitations. Paroxysmal nocturnal dyspnea. Shortness of breath. Syncope.    Past Medical History:  Diagnosis Date   Allergy    Anxiety    Asthma    with activity Mild   Colon polyps    GERD (gastroesophageal reflux disease)    Seasonal allergies     Past Surgical History:  Procedure Laterality Date   COLONOSCOPY      UPPER GASTROINTESTINAL ENDOSCOPY       Current Outpatient Medications  Medication Sig Dispense Refill   budesonide-formoterol (SYMBICORT) 160-4.5 MCG/ACT inhaler INHALE 2 PUFFS INTO THE LUNGS TWICE A DAY 30.6 each 3   buPROPion (WELLBUTRIN XL) 150 MG 24 hr tablet TAKE 1 TABLET BY MOUTH EVERY MORNING AFTER BREAKFAST Oral for 30 Days     cetirizine (ZYRTEC) 10 MG tablet Take 10 mg by mouth at bedtime.     DEXILANT 60 MG capsule TAKE 1 CAPSULE BY MOUTH EVERY DAY 90 capsule 0   escitalopram (LEXAPRO) 20 MG tablet Take 1 tablet (20 mg total) by mouth daily. 10 tablet 0   fluticasone (FLONASE) 50 MCG/ACT nasal spray Place 2 sprays into both nostrils daily. 16 g 11   gabapentin (NEURONTIN) 100 MG capsule Take 2 capsules (200 mg total) by mouth at bedtime. 180 capsule 1   Melatonin 5 MG TABS Take by mouth.     rosuvastatin (CRESTOR) 10 MG tablet TAKE 1 TABLET (10 MG TOTAL) BY MOUTH DAILY. PLEASE MAKE YEARLY APPOINTMENT FOR FUTURE REFILLS. THANK YOU 90 tablet 0   Vitamin D, Ergocalciferol, (DRISDOL) 1.25 MG (50000 UNIT) CAPS capsule Take 1 capsule (50,000 Units total) by mouth every 7 (seven) days. 12 capsule 0   No current facility-administered medications for this visit.    Allergies:   Trazodone and nefazodone  Social History:  The patient  reports that he has never smoked. He has never used smokeless tobacco. He reports current alcohol use. He reports that he does not use drugs.   Family History:  The patient's family history includes Asthma in his father; Bipolar disorder in his brother; Bladder Cancer in his father; CAD in his father; Colon cancer in his maternal grandmother; Colon polyps in his mother; Healthy in his sister; Hypertension in his father; Obesity in his father; Rectal cancer in his father.    ROS:  Please see the history of present illness.   Otherwise, review of systems are positive for some dietary indiscretion.   All other systems are reviewed and negative.     PHYSICAL EXAM: VS:  BP 130/80   Pulse 78   Ht '5\' 9"'$  (1.753 m)   Wt 186 lb 6.4 oz (84.6 kg)   SpO2 97%   BMI 27.53 kg/m  , BMI Body mass index is 27.53 kg/m. GEN: Well nourished, well developed, in no acute distress HEENT: normal Neck: no JVD, carotid bruits, or masses Cardiac: RRR; no murmurs, rubs, or gallops,no edema  Respiratory:  clear to auscultation bilaterally, normal work of breathing GI: soft, nontender, nondistended, + BS MS: no deformity or atrophy Skin: warm and dry, no rash Neuro:  Strength and sensation are intact Psych: euthymic mood, full affect   EKG:   The ekg ordered today demonstrates normal ECG   Recent Labs: 08/19/2021: TSH 3.35 05/03/2022: ALT 22; BUN 29; Creatinine, Ser 1.21; Hemoglobin 15.2; Platelets 243.0; Potassium 4.3; Sodium 139   Lipid Panel    Component Value Date/Time   CHOL 165 05/03/2022 0843   CHOL 154 03/30/2021 0816   TRIG 93.0 05/03/2022 0843   HDL 48.80 05/03/2022 0843   HDL 56 03/30/2021 0816   CHOLHDL 3 05/03/2022 0843   VLDL 18.6 05/03/2022 0843   LDLCALC 98 05/03/2022 0843   LDLCALC 85 03/30/2021 0816   LDLDIRECT 159.3 06/25/2011 0926     Other studies Reviewed: Additional studies/ records that were reviewed today with results demonstrating: LDL 98.     ASSESSMENT AND PLAN:  Coronary artery calcification: No angina.  Continue aggressive secondary prevention.  Healthy diet, regular exercise discussed. Hyperlipidemia: Continue lipid-lowering therapy.  Whole food, plant-based diet recommended.  Avoid processed foods.  Increase rosuvastatin 20 mg daily.   Liver and lipid tests in 3 months. Prior vasovagal syncope. Sinus brady noted in the past as well.  No further syncope.  Aortic atherosclerosis: Healthy lifestyle and regular exercise as noted below.   Current medicines are reviewed at length with the patient today.  The patient concerns regarding his medicines were addressed.  The following changes have been made:   No change  Labs/ tests ordered today include:  No orders of the defined types were placed in this encounter.   Recommend 150 minutes/week of aerobic exercise Low fat, low carb, high fiber diet recommended  Disposition:   FU in 1 year   Signed, Larae Grooms, MD  05/17/2022 2:34 PM    [facility] [address], [place], [place]  [address] Phone: [phone]; Fax: [phone]

## 2022-05-17 ENCOUNTER — Ambulatory Visit
Payer: No Typology Code available for payment source | Attending: Interventional Cardiology | Admitting: Interventional Cardiology

## 2022-05-17 ENCOUNTER — Encounter: Payer: Self-pay | Admitting: Interventional Cardiology

## 2022-05-17 VITALS — BP 130/80 | HR 78 | Ht 69.0 in | Wt 186.4 lb

## 2022-05-17 DIAGNOSIS — I2584 Coronary atherosclerosis due to calcified coronary lesion: Secondary | ICD-10-CM

## 2022-05-17 DIAGNOSIS — R001 Bradycardia, unspecified: Secondary | ICD-10-CM

## 2022-05-17 DIAGNOSIS — I2510 Atherosclerotic heart disease of native coronary artery without angina pectoris: Secondary | ICD-10-CM

## 2022-05-17 DIAGNOSIS — E782 Mixed hyperlipidemia: Secondary | ICD-10-CM

## 2022-05-17 DIAGNOSIS — I700 Atherosclerosis of aorta: Secondary | ICD-10-CM | POA: Diagnosis not present

## 2022-05-17 MED ORDER — ROSUVASTATIN CALCIUM 20 MG PO TABS: 20.0000 mg | ORAL_TABLET | Freq: Every day | ORAL | 3 refills | Status: DC

## 2022-05-17 NOTE — Patient Instructions (Addendum)
Medication Instructions:  Your physician has recommended you make the following change in your medication: Increase Rosuvastatin to 20 mg by mouth daily  *If you need a refill on your cardiac medications before your next appointment, please call your pharmacy*   Lab Work: Your physician recommends that you return for lab work on Aug 12, 2022.  Lipid and liver profiles.  This will be fasting.  The lab opens at 7:15  If you have labs (blood work) drawn today and your tests are completely normal, you will receive your results only by: [place] (if you have MyChart) OR A paper copy in the mail If you have any lab test that is abnormal or we need to change your treatment, we will call you to review the results.   Testing/Procedures: none   Follow-Up: At [facility], you and your health needs are our priority.  As part of our continuing mission to provide you with exceptional heart care, we have created designated Provider Care Teams.  These Care Teams include your primary Cardiologist (physician) and Advanced Practice Providers (APPs -  Physician Assistants and Nurse Practitioners) who all work together to provide you with the care you need, when you need it.  We recommend signing up for the patient portal called "MyChart".  Sign up information is provided on this After Visit Summary.  MyChart is used to connect with patients for Virtual Visits (Telemedicine).  Patients are able to view lab/test results, encounter notes, upcoming appointments, etc.  Non-urgent messages can be sent to your provider as well.   To learn more about what you can do with MyChart, go to [email or web address].    Your next appointment:   12 month(s)  Provider:   Larae Grooms, MD     Other Instructions

## 2022-05-18 ENCOUNTER — Other Ambulatory Visit: Payer: Self-pay | Admitting: Family Medicine

## 2022-05-19 ENCOUNTER — Telehealth: Payer: Self-pay

## 2022-05-19 NOTE — Telephone Encounter (Signed)
Patient notified

## 2022-05-19 NOTE — Telephone Encounter (Signed)
DOD Patient of Dr Hilarie Fredrickson. Patient is scheduled for his colonoscopy tomorrow at 11:00 am. He lost his instructions and reached out to [place]. I have instructed him. He responded with the following information.  Hi, I had breakfast at 830 this morning because I thought it was 24 hours previous. I haven't eaten since.  I also had raw yesterday.  carrots yesterday. Do I need to reschedule if I only do liquids from hereon out, or am I ok?   Thanks, and my apologies for screwing it up.   [facility]  Please advise

## 2022-05-20 ENCOUNTER — Ambulatory Visit (AMBULATORY_SURGERY_CENTER): Payer: No Typology Code available for payment source | Admitting: Internal Medicine

## 2022-05-20 ENCOUNTER — Encounter: Payer: Self-pay | Admitting: Internal Medicine

## 2022-05-20 ENCOUNTER — Encounter: Payer: No Typology Code available for payment source | Admitting: Internal Medicine

## 2022-05-20 VITALS — BP 107/67 | HR 58 | Temp 96.2°F | Resp 14 | Ht 68.0 in | Wt 175.0 lb

## 2022-05-20 DIAGNOSIS — Z86010 Personal history of colonic polyps: Secondary | ICD-10-CM | POA: Diagnosis not present

## 2022-05-20 DIAGNOSIS — Z09 Encounter for follow-up examination after completed treatment for conditions other than malignant neoplasm: Secondary | ICD-10-CM | POA: Diagnosis present

## 2022-05-20 MED ORDER — SODIUM CHLORIDE 0.9 % IV SOLN: 500.0000 mL | Freq: Once | INTRAVENOUS | Status: DC

## 2022-05-20 NOTE — Progress Notes (Signed)
Report to PACU, RN, vss, BBS= Clear.  

## 2022-05-20 NOTE — Progress Notes (Signed)
Pt's states no medical or surgical changes since previsit or office visit.   Pt drank approximately 2 oz  of water around 0940. CRNA made aware.

## 2022-05-20 NOTE — Op Note (Signed)
[facility] Patient Name: George [name] Procedure Date: 05/20/2022 10:56 AM MRN: [id number] Endoscopist: Jerene Bears , MD, [id number] Age: 57 Referring MD:  Date of Birth: 1966/02/05 Gender: Male Account #: [id number] Procedure:                Colonoscopy Indications:              High risk colon cancer surveillance: Personal                            history of sessile serrated colon polyps (less than                            10 mm in size) with no dysplasia, Last colonoscopy:                            September 2018 (SSP x 2) Medicines:                Monitored Anesthesia Care Procedure:                Pre-Anesthesia Assessment:                           - Prior to the procedure, a History and Physical                            was performed, and patient medications and                            allergies were reviewed. The patient's tolerance of                            previous anesthesia was also reviewed. The risks                            and benefits of the procedure and the sedation                            options and risks were discussed with the patient.                            All questions were answered, and informed consent                            was obtained. Prior Anticoagulants: The patient has                            taken no anticoagulant or antiplatelet agents. ASA                            Grade Assessment: II - A patient with mild systemic                            disease. After reviewing the risks and benefits,  the patient was deemed in satisfactory condition to                            undergo the procedure.                           After obtaining informed consent, the colonoscope                            was passed under direct vision. Throughout the                            procedure, the patient's blood pressure, pulse, and                            oxygen saturations were monitored  continuously. The                            CF [id number] was introduced through the anus                            and advanced to the cecum, identified by                            appendiceal orifice and ileocecal valve. The                            colonoscopy was performed without difficulty. The                            patient tolerated the procedure well. The quality                            of the bowel preparation was excellent. The                            ileocecal valve, appendiceal orifice, and rectum                            were photographed. Scope In: 11:10:20 AM Scope Out: 11:27:59 AM Scope Withdrawal Time: 0 hours 12 minutes 46 seconds  Total Procedure Duration: 0 hours 17 minutes 39 seconds  Findings:                 The digital rectal exam was normal.                           Multiple medium-mouthed and small-mouthed                            diverticula were found in the sigmoid colon,                            descending colon and transverse colon.  Internal hemorrhoids were found during                            retroflexion. The hemorrhoids were small.                           The exam was otherwise without abnormality. Complications:            No immediate complications. Estimated Blood Loss:     Estimated blood loss: none. Impression:               - Mild diverticulosis in the sigmoid colon, in the                            descending colon and in the transverse colon.                           - Small internal hemorrhoids.                           - The examination was otherwise normal.                           - No specimens collected. Recommendation:           - Patient has a contact number available for                            emergencies. The signs and symptoms of potential                            delayed complications were discussed with the                            patient. Return to normal  activities tomorrow.                            Written discharge instructions were provided to the                            patient.                           - Resume previous diet.                           - Continue present medications.                           - Repeat colonoscopy in 10 years for surveillance. Jerene Bears, MD 05/20/2022 11:30:25 AM This report has been signed electronically.

## 2022-05-20 NOTE — Patient Instructions (Signed)
Handouts provided on diverticulosis and hemorrhoids.   Resume previous diet.  Continue present medications.  Repeat colonoscopy in 10 years for surveillance.   YOU HAD AN ENDOSCOPIC PROCEDURE TODAY AT [facility]:   Refer to the procedure report that was given to you for any specific questions about what was found during the examination.  If the procedure report does not answer your questions, please call your gastroenterologist to clarify.  If you requested that your care partner not be given the details of your procedure findings, then the procedure report has been included in a sealed envelope for you to review at your convenience later.  YOU SHOULD EXPECT: Some feelings of bloating in the abdomen. Passage of more gas than usual.  Walking can help get rid of the air that was put into your GI tract during the procedure and reduce the bloating. If you had a lower endoscopy (such as a colonoscopy or flexible sigmoidoscopy) you may notice spotting of blood in your stool or on the toilet paper. If you underwent a bowel prep for your procedure, you may not have a normal bowel movement for a few days.  Please Note:  You might notice some irritation and congestion in your nose or some drainage.  This is from the oxygen used during your procedure.  There is no need for concern and it should clear up in a day or so.  SYMPTOMS TO REPORT IMMEDIATELY:  Following lower endoscopy (colonoscopy or flexible sigmoidoscopy):  Excessive amounts of blood in the stool  Significant tenderness or worsening of abdominal pains  Swelling of the abdomen that is new, acute  Fever of 100F or higher  For urgent or emergent issues, a gastroenterologist can be reached at any hour by calling [phone]. Do not use MyChart messaging for urgent concerns.    DIET:  We do recommend a small meal at first, but then you may proceed to your regular diet.  Drink plenty of fluids but you should avoid alcoholic  beverages for 24 hours.  ACTIVITY:  You should plan to take it easy for the rest of today and you should NOT DRIVE or use heavy machinery until tomorrow (because of the sedation medicines used during the test).    FOLLOW UP: Our staff will call the number listed on your records the next business day following your procedure.  We will call around 7:15- 8:00 am to check on you and address any questions or concerns that you may have regarding the information given to you following your procedure. If we do not reach you, we will leave a message.     If any biopsies were taken you will be contacted by phone or by letter within the next 1-3 weeks.  Please call us at [phone] if you have not heard about the biopsies in 3 weeks.    SIGNATURES/CONFIDENTIALITY: You and/or your care partner have signed paperwork which will be entered into your electronic medical record.  These signatures attest to the fact that that the information above on your After Visit Summary has been reviewed and is understood.  Full responsibility of the confidentiality of this discharge information lies with you and/or your care-partner.

## 2022-05-20 NOTE — Progress Notes (Signed)
GASTROENTEROLOGY PROCEDURE H&P NOTE   Primary Care Physician: Marin Olp, MD    Reason for Procedure:  History of SSP x 2  Plan:    Colonoscopy  Patient is appropriate for endoscopic procedure(s) in the ambulatory ([place]) setting.  The nature of the procedure, as well as the risks, benefits, and alternatives were carefully and thoroughly reviewed with the patient. Ample time for discussion and questions allowed. The patient understood, was satisfied, and agreed to proceed.     HPI: George [name] is a 57 y.o. male who presents for surveillance colonoscopy.  Medical history as below.  Tolerated the prep.  No recent chest pain or shortness of breath.  No abdominal pain today.  Past Medical History:  Diagnosis Date   Allergy    Anxiety    Asthma    with activity Mild   Colon polyps    GERD (gastroesophageal reflux disease)    Seasonal allergies     Past Surgical History:  Procedure Laterality Date   COLONOSCOPY     UPPER GASTROINTESTINAL ENDOSCOPY      Prior to Admission medications   Medication Sig Start Date End Date Taking? Authorizing Provider  budesonide-formoterol (SYMBICORT) 160-4.5 MCG/ACT inhaler INHALE 2 PUFFS INTO THE LUNGS TWICE A DAY 05/03/22  Yes Marin Olp, MD  buPROPion (WELLBUTRIN XL) 150 MG 24 hr tablet TAKE 1 TABLET BY MOUTH EVERY MORNING AFTER BREAKFAST Oral for 30 Days   Yes [provider]  cetirizine (ZYRTEC) 10 MG tablet Take 10 mg by mouth at bedtime.   Yes [provider]  cyanocobalamin (VITAMIN B12) 500 MCG tablet Take 1,000 mcg by mouth daily.   Yes [provider]  DEXILANT 60 MG capsule TAKE 1 CAPSULE BY MOUTH EVERY DAY 01/11/22  Yes Marin Olp, MD  escitalopram (LEXAPRO) 20 MG tablet TAKE 1 TABLET BY MOUTH EVERY DAY 05/18/22  Yes Marin Olp, MD  fluticasone [facility]) 50 MCG/ACT nasal spray Place 2 sprays into both nostrils daily. 04/23/15  Yes Janith Lima, MD  gabapentin (NEURONTIN) 100 MG  capsule Take 2 capsules (200 mg total) by mouth at bedtime. 12/29/21  Yes [name], DO  Melatonin 5 MG TABS Take by mouth.   Yes [provider]  rosuvastatin (CRESTOR) 20 MG tablet Take 1 tablet (20 mg total) by mouth daily. 05/17/22  Yes Jettie Booze, MD  Vitamin D, Ergocalciferol, (DRISDOL) 1.25 MG (50000 UNIT) CAPS capsule Take 1 capsule (50,000 Units total) by mouth every 7 (seven) days. 05/03/22  Yes Marin Olp, MD    Current Outpatient Medications  Medication Sig Dispense Refill   budesonide-formoterol (SYMBICORT) 160-4.5 MCG/ACT inhaler INHALE 2 PUFFS INTO THE LUNGS TWICE A DAY 30.6 each 3   buPROPion (WELLBUTRIN XL) 150 MG 24 hr tablet TAKE 1 TABLET BY MOUTH EVERY MORNING AFTER BREAKFAST Oral for 30 Days     cetirizine (ZYRTEC) 10 MG tablet Take 10 mg by mouth at bedtime.     cyanocobalamin (VITAMIN B12) 500 MCG tablet Take 1,000 mcg by mouth daily.     DEXILANT 60 MG capsule TAKE 1 CAPSULE BY MOUTH EVERY DAY 90 capsule 0   escitalopram (LEXAPRO) 20 MG tablet TAKE 1 TABLET BY MOUTH EVERY DAY 90 tablet 3   fluticasone (FLONASE) 50 MCG/ACT nasal spray Place 2 sprays into both nostrils daily. 16 g 11   gabapentin (NEURONTIN) 100 MG capsule Take 2 capsules (200 mg total) by mouth at bedtime. 180 capsule 1  Melatonin 5 MG TABS Take by mouth.     rosuvastatin (CRESTOR) 20 MG tablet Take 1 tablet (20 mg total) by mouth daily. 90 tablet 3   Vitamin D, Ergocalciferol, (DRISDOL) 1.25 MG (50000 UNIT) CAPS capsule Take 1 capsule (50,000 Units total) by mouth every 7 (seven) days. 12 capsule 0   Current Facility-Administered Medications  Medication Dose Route Frequency Provider Last Rate Last Admin   0.9 %  sodium chloride infusion  500 mL Intravenous Once Genae Strine, Lajuan Lines, MD        Allergies as of 05/20/2022 - Review Complete 05/20/2022  Allergen Reaction Noted   Trazodone and nefazodone  04/10/2021    Family History  Problem Relation Age of Onset   Colon  polyps Mother    Rectal cancer Father    Obesity Father    Asthma Father    Hypertension Father    Bladder Cancer Father    CAD Father        prior smoker. 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  Tobacco Use   Smoking status: Never   Smokeless tobacco: Never  Vaping Use   Vaping Use: Never used  Substance and Sexual Activity   Alcohol use: Yes    Comment: occasional    Drug use: No   Sexual activity: Yes    Comment: wife   Other Topics Concern   Not on file  Social History Narrative   Married. 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       [place]: tae kwon do, exercise, movies, hiking   Social Determinants of Health   Financial Resource Strain: Not on file  Food Insecurity: Not on file  Transportation Needs: Not on file  Physical Activity: Not on file  Stress: Not on file  Social Connections: Not on file  Intimate Partner Violence: Not on file    Physical Exam: Vital signs in last 24 hours: '@BP'$  121/77   Pulse 72   Temp (!) 96.2 F (35.7 C)   Ht '5\' 8"'$  (1.727 m)   Wt 175 lb (79.4 kg)   SpO2 98%   BMI 26.61 kg/m  GEN: NAD EYE: Sclerae anicteric ENT: MMM CV: Non-tachycardic Pulm: CTA b/l GI: Soft, NT/ND NEURO:  Alert & Oriented x 3   Zenovia Jarred, MD [facility]  05/20/2022 10:57 AM

## 2022-05-21 ENCOUNTER — Telehealth: Payer: Self-pay | Admitting: *Deleted

## 2022-05-21 NOTE — Telephone Encounter (Signed)
  Follow up Call-     05/20/2022   10:23 AM  Call back number  Post procedure Call Back phone  # [phone]  Permission to leave phone message Yes     Patient questions:  Do you have a fever, pain , or abdominal swelling? No. Pain Score  0 *  Have you tolerated food without any problems? Yes.    Have you been able to return to your normal activities? Yes.    Do you have any questions about your discharge instructions: Diet   No. Medications  No. Follow up visit  No.  Do you have questions or concerns about your Care? No.  Actions: * If pain score is 4 or above: No action needed, pain <4.

## 2022-06-01 NOTE — Progress Notes (Signed)
    George [name] George [name] Phone: [phone]   Assessment and Plan:     There are no diagnoses linked to this encounter.  ***   Pertinent previous records reviewed include ***   Follow Up: ***     Subjective:   I, Lorely Bubb, am serving as a Education administrator for Doctor Glennon Mac  Chief Complaint: back pain   HPI:   06/02/2022 Patient is a 57 year old male complaining of back pain. Patient states  Relevant Historical Information: ***  Additional pertinent review of systems negative.   Current Outpatient Medications:    budesonide-formoterol (SYMBICORT) 160-4.5 MCG/ACT inhaler, INHALE 2 PUFFS INTO THE LUNGS TWICE A DAY, Disp: 30.6 each, Rfl: 3   buPROPion (WELLBUTRIN XL) 150 MG 24 hr tablet, TAKE 1 TABLET BY MOUTH EVERY MORNING AFTER BREAKFAST Oral for 30 Days, Disp: , Rfl:    cetirizine (ZYRTEC) 10 MG tablet, Take 10 mg by mouth at bedtime., Disp: , Rfl:    cyanocobalamin (VITAMIN B12) 500 MCG tablet, Take 1,000 mcg by mouth daily., Disp: , Rfl:    DEXILANT 60 MG capsule, TAKE 1 CAPSULE BY MOUTH EVERY DAY, Disp: 90 capsule, Rfl: 0   escitalopram (LEXAPRO) 20 MG tablet, TAKE 1 TABLET BY MOUTH EVERY DAY, Disp: 90 tablet, Rfl: 3   fluticasone (FLONASE) 50 MCG/ACT nasal spray, Place 2 sprays into both nostrils daily., Disp: 16 g, Rfl: 11   gabapentin (NEURONTIN) 100 MG capsule, Take 2 capsules (200 mg total) by mouth at bedtime., Disp: 180 capsule, Rfl: 1   Melatonin 5 MG TABS, Take by mouth., Disp: , Rfl:    rosuvastatin (CRESTOR) 20 MG tablet, Take 1 tablet (20 mg total) by mouth daily., Disp: 90 tablet, Rfl: 3   Vitamin D, Ergocalciferol, (DRISDOL) 1.25 MG (50000 UNIT) CAPS capsule, Take 1 capsule (50,000 Units total) by mouth every 7 (seven) days., Disp: 12 capsule, Rfl: 0   Objective:     There were no vitals filed for this visit.    There is no height or weight on file to calculate BMI.     Physical Exam:    ***   Electronically signed by:  George [name] D.[facility] 2:13 PM 06/01/22

## 2022-06-02 ENCOUNTER — Ambulatory Visit: Payer: No Typology Code available for payment source | Admitting: Sports Medicine

## 2022-06-02 VITALS — BP 122/78 | HR 73 | Ht 68.0 in | Wt 185.0 lb

## 2022-06-02 DIAGNOSIS — M9905 Segmental and somatic dysfunction of pelvic region: Secondary | ICD-10-CM

## 2022-06-02 DIAGNOSIS — M9906 Segmental and somatic dysfunction of lower extremity: Secondary | ICD-10-CM | POA: Diagnosis not present

## 2022-06-02 DIAGNOSIS — M545 Low back pain, unspecified: Secondary | ICD-10-CM | POA: Diagnosis not present

## 2022-06-02 DIAGNOSIS — M9903 Segmental and somatic dysfunction of lumbar region: Secondary | ICD-10-CM

## 2022-06-02 DIAGNOSIS — G8929 Other chronic pain: Secondary | ICD-10-CM

## 2022-06-02 MED ORDER — MELOXICAM 15 MG PO TABS: 15.0000 mg | ORAL_TABLET | Freq: Every day | ORAL | 0 refills | Status: DC

## 2022-06-02 NOTE — Patient Instructions (Addendum)
Good to see you  - Start meloxicam 15 mg daily x2 weeks.  If still having pain after 2 weeks, complete 3rd-week of meloxicam. May use remaining meloxicam as needed once daily for pain control.  Do not to use additional NSAIDs while taking meloxicam.  May use Tylenol [phone] mg 2 to 3 times a day for breakthrough pain. Low back HEP Keep appointment with Dr. Tamala Julian

## 2022-06-15 ENCOUNTER — Ambulatory Visit: Payer: No Typology Code available for payment source | Admitting: Family Medicine

## 2022-06-23 ENCOUNTER — Other Ambulatory Visit: Payer: Self-pay | Admitting: Family Medicine

## 2022-06-24 NOTE — Progress Notes (Signed)
[name] [address] [place] [address] Phone: [phone] Subjective:   Bruce Donath, am serving as a scribe for Dr. Antoine Primas.  I'm seeing this patient by the request  of:  Shelva Majestic, MD  CC: back and neck pain follow up   BJY:NWGNFAOZHY  George [name] is a 57 y.o. male coming in with complaint of back and neck pain. OMT 06/02/2022. Patient states that he is having some tenderness in lower thoracic and upper lumbar.  Medications patient has been prescribed: Gabapentin  Taking:         Reviewed prior external information including notes and imaging from previsou exam, outside providers and external EMR if available.   As well as notes that were available from care everywhere and other healthcare systems.  Past medical history, social, surgical and family history all reviewed in electronic medical record.  No pertanent information unless stated regarding to the chief complaint.   Past Medical History:  Diagnosis Date   Allergy    Anxiety    Asthma    with activity Mild   Colon polyps    GERD (gastroesophageal reflux disease)    Seasonal allergies     Allergies  Allergen Reactions   Trazodone And Nefazodone     Syncopal episode      Review of Systems:  No headache, visual changes, nausea, vomiting, diarrhea, constipation, dizziness, abdominal pain, skin rash, fevers, chills, night sweats, weight loss, swollen lymph nodes, body aches, joint swelling, chest pain, shortness of breath, mood changes. POSITIVE muscle aches  Objective  Blood pressure 106/74, pulse 70, height  (1.727 m), weight 184 lb (83.5 kg), SpO2 99 %.   General: No apparent distress alert and oriented x3 mood and affect normal, dressed appropriately.  HEENT: Pupils equal, extraocular movements intact  Respiratory: Patient's speak in full sentences and does not appear short of breath  Cardiovascular: No lower extremity edema, non tender, no  erythema  MSK:  Back shows continued tightness of the right paraspinal musculature.  Seems to be more in the thoracolumbar junction.  Does have tightness with hip flexor on the right side.  Osteopathic findings  C3 flexed rotated and side bent right C6 flexed rotated and side bent left T3 extended rotated and side bent right inhaled rib T11 extended rotated and side bent right L1 flexed rotated and side bent right Sacrum right on right     Assessment and Plan:  Lumbar radiculopathy Seems to be more secondary to hip tightness and hip flexor tightness on the right side.  Has been out of doing a lot of traveling recently.  I think this did cause some mild exacerbation.  Discussed icing regimen and home exercises, which activities to do and which ones to avoid.  Follow-up again in 6 to 8 weeks.    Nonallopathic problems  Decision today to treat with OMT was based on Physical Exam  After verbal consent patient was treated with HVLA, ME, FPR techniques in cervical, rib, thoracic, lumbar, and sacral  areas  Patient tolerated the procedure well with improvement in symptoms  Patient given exercises, stretches and lifestyle modifications  See medications in patient instructions if given  Patient will follow up in 4-8 weeks     The above documentation has been reviewed and is accurate and complete Judi Saa, DO         Note: This dictation was prepared with Dragon dictation along with smaller phrase technology. Any  transcriptional errors that result from this process are unintentional.

## 2022-06-29 ENCOUNTER — Ambulatory Visit: Payer: No Typology Code available for payment source | Admitting: Family Medicine

## 2022-06-29 ENCOUNTER — Other Ambulatory Visit: Payer: Self-pay | Admitting: Sports Medicine

## 2022-06-29 ENCOUNTER — Encounter: Payer: Self-pay | Admitting: Family Medicine

## 2022-06-29 VITALS — BP 106/74 | HR 70 | Ht 68.0 in | Wt 184.0 lb

## 2022-06-29 DIAGNOSIS — M9903 Segmental and somatic dysfunction of lumbar region: Secondary | ICD-10-CM

## 2022-06-29 DIAGNOSIS — M9908 Segmental and somatic dysfunction of rib cage: Secondary | ICD-10-CM

## 2022-06-29 DIAGNOSIS — M9904 Segmental and somatic dysfunction of sacral region: Secondary | ICD-10-CM | POA: Diagnosis not present

## 2022-06-29 DIAGNOSIS — M9901 Segmental and somatic dysfunction of cervical region: Secondary | ICD-10-CM

## 2022-06-29 DIAGNOSIS — M5416 Radiculopathy, lumbar region: Secondary | ICD-10-CM

## 2022-06-29 DIAGNOSIS — M9902 Segmental and somatic dysfunction of thoracic region: Secondary | ICD-10-CM

## 2022-06-29 NOTE — Assessment & Plan Note (Signed)
Seems to be more secondary to hip tightness and hip flexor tightness on the right side.  Has been out of doing a lot of traveling recently.  I think this did cause some mild exacerbation.  Discussed icing regimen and home exercises, which activities to do and which ones to avoid.  Follow-up again in 6 to 8 weeks.

## 2022-06-29 NOTE — Patient Instructions (Signed)
Great to see you Ok to do core See me in 6-8 weeks

## 2022-07-18 ENCOUNTER — Encounter: Payer: Self-pay | Admitting: Family Medicine

## 2022-07-19 NOTE — Progress Notes (Unsigned)
Aleen Sells D[facility] [address] [place] [address] Phone: [phone]   Assessment and Plan:     There are no diagnoses linked to this encounter.  *** - Patient has received significant relief with OMT in the past.  Elects for repeat OMT today.  Tolerated well per note below. - Decision today to treat with OMT was based on Physical Exam   After verbal consent patient was treated with HVLA (high velocity low amplitude), ME (muscle energy), FPR (flex positional release), ST (soft tissue), PC/PD (Pelvic Compression/ Pelvic Decompression) techniques in cervical, rib, thoracic, lumbar, and pelvic areas. Patient tolerated the procedure well with improvement in symptoms.  Patient educated on potential side effects of soreness and recommended to rest, hydrate, and use Tylenol as needed for pain control.   Pertinent previous records reviewed include ***   Follow Up: ***     Subjective:   I, Isamar Nazir, am serving as a Neurosurgeon for Doctor Richardean Sale   Chief Complaint: back pain    HPI:    06/02/2022 Patient is a 57 year old male complaining of back pain. Patient states that about a week ago, no MOI, started feeling tightness and pain on the left side but it has radiated to the whole back , feels muscular, has been improving , pain when getting up he has a certain spot where the back "catches", ibu for the pain and that seems to help a little, pain is worst at night , heat and ice , walking and swim help    07/20/2022 Patient states    Relevant Historical Information: GERD  Additional pertinent review of systems negative.  Current Outpatient Medications  Medication Sig Dispense Refill   budesonide-formoterol (SYMBICORT) 160-4.5 MCG/ACT inhaler INHALE 2 PUFFS INTO THE LUNGS TWICE A DAY 30.6 each 3   buPROPion (WELLBUTRIN XL) 150 MG 24 hr tablet TAKE 1 TABLET BY MOUTH EVERY MORNING AFTER BREAKFAST Oral for 30 Days     cetirizine (ZYRTEC) 10 MG  tablet Take 10 mg by mouth at bedtime.     cyanocobalamin (VITAMIN B12) 500 MCG tablet Take 1,000 mcg by mouth daily.     DEXILANT 60 MG capsule TAKE 1 CAPSULE BY MOUTH EVERY DAY 90 capsule 0   escitalopram (LEXAPRO) 20 MG tablet TAKE 1 TABLET BY MOUTH EVERY DAY 90 tablet 3   fluticasone (FLONASE) 50 MCG/ACT nasal spray Place 2 sprays into both nostrils daily. 16 g 11   gabapentin (NEURONTIN) 100 MG capsule Take 2 capsules (200 mg total) by mouth at bedtime. 180 capsule 1   Melatonin 5 MG TABS Take by mouth.     meloxicam (MOBIC) 15 MG tablet Take 1 tablet (15 mg total) by mouth daily. 30 tablet 0   rosuvastatin (CRESTOR) 10 MG tablet TAKE 1 TABLET DAILY. PLEASE MAKE YEARLY APPOINTMENT FOR FUTURE REFILLS. THANK YOU 90 tablet 0   rosuvastatin (CRESTOR) 20 MG tablet Take 1 tablet (20 mg total) by mouth daily. 90 tablet 3   Vitamin D, Ergocalciferol, (DRISDOL) 1.25 MG (50000 UNIT) CAPS capsule Take 1 capsule (50,000 Units total) by mouth every 7 (seven) days. 12 capsule 0   No current facility-administered medications for this visit.      Objective:     There were no vitals filed for this visit.    There is no height or weight on file to calculate BMI.    Physical Exam:     General: Well-appearing, cooperative, sitting comfortably in  no acute distress.   OMT Physical Exam:  ASIS Compression Test: Positive Right Cervical: TTP paraspinal, *** Rib: Bilateral elevated first rib with TTP Thoracic: TTP paraspinal,*** Lumbar: TTP paraspinal,*** Pelvis: Right anterior innominate  Electronically signed by:  Aleen Sells [facility] 12:17 PM 07/19/22

## 2022-07-20 ENCOUNTER — Ambulatory Visit: Payer: No Typology Code available for payment source | Admitting: Sports Medicine

## 2022-07-20 VITALS — BP 110/82 | HR 75 | Ht 68.0 in | Wt 183.0 lb

## 2022-07-20 DIAGNOSIS — M545 Low back pain, unspecified: Secondary | ICD-10-CM

## 2022-07-20 DIAGNOSIS — G8929 Other chronic pain: Secondary | ICD-10-CM | POA: Diagnosis not present

## 2022-07-20 DIAGNOSIS — M9904 Segmental and somatic dysfunction of sacral region: Secondary | ICD-10-CM

## 2022-07-20 DIAGNOSIS — M9903 Segmental and somatic dysfunction of lumbar region: Secondary | ICD-10-CM | POA: Diagnosis not present

## 2022-07-20 DIAGNOSIS — M9905 Segmental and somatic dysfunction of pelvic region: Secondary | ICD-10-CM

## 2022-07-20 NOTE — Patient Instructions (Addendum)
Good to see you  Low back HEP  Pt referral  Keep follow with Dr. Katrinka Blazing

## 2022-07-27 ENCOUNTER — Other Ambulatory Visit: Payer: Self-pay

## 2022-07-27 MED ORDER — CYCLOBENZAPRINE HCL 5 MG PO TABS: 5.0000 mg | ORAL_TABLET | Freq: Two times a day (BID) | ORAL | 0 refills | Status: DC | PRN

## 2022-08-05 NOTE — Progress Notes (Signed)
[name] [address] [place] [address] Phone: [phone] Subjective:   George [name], am serving as a scribe for Dr. Antoine Primas.  I'm seeing this patient by the request  of:  Shelva Majestic, MD  CC: Back and neck pain follow-up  BJY:NWGNFAOZHY  George [name] is a 57 y.o. male coming in with complaint of back and neck pain. OMT 06/29/2022. Patient states that his pain has improved over past 3 weeks. Pain has been moving around but is no longer present.   Medications patient has been prescribed: Flexeril  Taking:         Reviewed prior external information including notes and imaging from previsou exam, outside providers and external EMR if available.   As well as notes that were available from care everywhere and other healthcare systems.  Past medical history, social, surgical and family history all reviewed in electronic medical record.  No pertanent information unless stated regarding to the chief complaint.   Past Medical History:  Diagnosis Date   Allergy    Anxiety    Asthma    with activity Mild   Colon polyps    GERD (gastroesophageal reflux disease)    Seasonal allergies     Allergies  Allergen Reactions   Trazodone And Nefazodone     Syncopal episode      Review of Systems:  No headache, visual changes, nausea, vomiting, diarrhea, constipation, dizziness, abdominal pain, skin rash, fevers, chills, night sweats, weight loss, swollen lymph nodes, body aches, joint swelling, chest pain, shortness of breath, mood changes. POSITIVE muscle aches  Objective  Blood pressure 110/78, pulse 97, height 5\' 8"  (1.727 m), weight 182 lb (82.6 kg).   General: No apparent distress alert and oriented x3 mood and affect normal, dressed appropriately.  HEENT: Pupils equal, extraocular movements intact  Respiratory: Patient's speak in full sentences and does not appear short of breath  Cardiovascular: No lower extremity  edema, non tender, no erythema  Back exam does have some loss of lordosis noted.  Some tenderness to palpation in the paraspinal musculature. Significant tightness noted in the right parascapular area.  Neck exam does have some limited sidebending bilaterally.  Osteopathic findings  C2 flexed rotated and side bent right C6 flexed rotated and side bent left T3 extended rotated and side bent right inhaled rib T9 extended rotated and side bent left L2 flexed rotated and side bent right Sacrum right on right       Assessment and Plan:  Poor posture Continue work on posture and ergonomics, could be potentially stress related as well.  We discussed the muscle relaxers and given vitamin D.  Discussed which activities to do and which ones to avoid.  Increase activity slowly over the course of next several weeks.  Follow-up with me again in 6 to 8 weeks.    Nonallopathic problems  Decision today to treat with OMT was based on Physical Exam  After verbal consent patient was treated with HVLA, ME, FPR techniques in cervical, rib, thoracic, lumbar, and sacral  areas  Patient tolerated the procedure well with improvement in symptoms  Patient given exercises, stretches and lifestyle modifications  See medications in patient instructions if given  Patient will follow up in 4-8 weeks     The above documentation has been reviewed and is accurate and complete Judi Saa, DO         Note: This dictation was prepared with Dragon dictation along with  smaller phrase technology. Any transcriptional errors that result from this process are unintentional.

## 2022-08-10 ENCOUNTER — Ambulatory Visit: Payer: No Typology Code available for payment source | Admitting: Family Medicine

## 2022-08-10 VITALS — BP 110/78 | HR 97 | Ht 68.0 in | Wt 182.0 lb

## 2022-08-10 DIAGNOSIS — M9901 Segmental and somatic dysfunction of cervical region: Secondary | ICD-10-CM

## 2022-08-10 DIAGNOSIS — M9904 Segmental and somatic dysfunction of sacral region: Secondary | ICD-10-CM | POA: Diagnosis not present

## 2022-08-10 DIAGNOSIS — M9908 Segmental and somatic dysfunction of rib cage: Secondary | ICD-10-CM

## 2022-08-10 DIAGNOSIS — M9903 Segmental and somatic dysfunction of lumbar region: Secondary | ICD-10-CM

## 2022-08-10 DIAGNOSIS — R293 Abnormal posture: Secondary | ICD-10-CM

## 2022-08-10 DIAGNOSIS — M9902 Segmental and somatic dysfunction of thoracic region: Secondary | ICD-10-CM

## 2022-08-10 MED ORDER — PREDNISONE 20 MG PO TABS: 40.0000 mg | ORAL_TABLET | Freq: Every day | ORAL | 0 refills | Status: DC

## 2022-08-10 NOTE — Patient Instructions (Signed)
prednisone 40 mg for 10 days  [email or web address]  See you in August

## 2022-08-10 NOTE — Assessment & Plan Note (Signed)
Continue work on [employer], could be potentially stress related as well.  We discussed the muscle relaxers and given vitamin D.  Discussed which activities to do and which ones to avoid.  Increase activity slowly over the course of next several weeks.  Follow-up with me again in 6 to 8 weeks.

## 2022-08-12 ENCOUNTER — Other Ambulatory Visit: Payer: Self-pay

## 2022-08-12 ENCOUNTER — Ambulatory Visit: Payer: No Typology Code available for payment source | Attending: Interventional Cardiology

## 2022-08-12 ENCOUNTER — Encounter: Payer: Self-pay | Admitting: Family Medicine

## 2022-08-12 DIAGNOSIS — E782 Mixed hyperlipidemia: Secondary | ICD-10-CM

## 2022-08-13 ENCOUNTER — Telehealth: Payer: Self-pay | Admitting: *Deleted

## 2022-08-13 LAB — LIPID PANEL
Chol/HDL Ratio: 3 ratio (ref 0.0–5.0)
Cholesterol, Total: 166 mg/dL (ref 100–199)
HDL: 55 mg/dL (ref >39)
LDL Chol Calc (NIH): 93 mg/dL (ref 0–99)
Triglycerides: 99 mg/dL (ref 0–149)
VLDL Cholesterol Cal: 18 mg/dL (ref 5–40)

## 2022-08-13 LAB — HEPATIC FUNCTION PANEL
ALT: 26 IU/L (ref 0–44)
AST: 24 IU/L (ref 0–40)
Albumin: 4.5 g/dL (ref 3.8–4.9)
Alkaline Phosphatase: 87 IU/L (ref 44–121)
Bilirubin Total: 0.4 mg/dL (ref 0.0–1.2)
Bilirubin, Direct: 0.12 mg/dL (ref 0.00–0.40)
Total Protein: 7.3 g/dL (ref 6.0–8.5)

## 2022-08-13 NOTE — Telephone Encounter (Signed)
Per DPR it is OK to leave a detailed message.  Left message that Dr Eldridge Dace would like him to continue Rosuvastatin 20 mg daily and to call office if any questions.

## 2022-08-13 NOTE — Telephone Encounter (Signed)
-----   Message from Corky Crafts, MD sent at 08/13/2022  8:39 AM EDT ----- LFTs normal.  LDL < 100 but increased from prior. Need to clarify dose of statin, Rosuvastatin 10 mg vs. 20 mg.

## 2022-08-13 NOTE — Telephone Encounter (Signed)
Patient notified.  He reports he is taking Rosuvastatin 20 mg daily

## 2022-08-13 NOTE — Telephone Encounter (Signed)
OK. Continue rosuvastatin 20 mg daily.

## 2022-08-17 ENCOUNTER — Ambulatory Visit: Payer: No Typology Code available for payment source | Attending: Sports Medicine

## 2022-08-17 ENCOUNTER — Other Ambulatory Visit: Payer: Self-pay

## 2022-08-17 DIAGNOSIS — G8929 Other chronic pain: Secondary | ICD-10-CM | POA: Insufficient documentation

## 2022-08-17 DIAGNOSIS — M545 Low back pain, unspecified: Secondary | ICD-10-CM | POA: Diagnosis not present

## 2022-08-17 DIAGNOSIS — M6281 Muscle weakness (generalized): Secondary | ICD-10-CM | POA: Insufficient documentation

## 2022-08-17 DIAGNOSIS — M9905 Segmental and somatic dysfunction of pelvic region: Secondary | ICD-10-CM | POA: Insufficient documentation

## 2022-08-17 DIAGNOSIS — M9904 Segmental and somatic dysfunction of sacral region: Secondary | ICD-10-CM | POA: Diagnosis not present

## 2022-08-17 DIAGNOSIS — M5459 Other low back pain: Secondary | ICD-10-CM | POA: Insufficient documentation

## 2022-08-17 DIAGNOSIS — M9903 Segmental and somatic dysfunction of lumbar region: Secondary | ICD-10-CM | POA: Diagnosis not present

## 2022-08-17 NOTE — Therapy (Signed)
OUTPATIENT PHYSICAL THERAPY THORACOLUMBAR EVALUATION   Patient Name: George [name] MRN: [id number] DOB:04-13-1965, 57 y.o., male Today's Date: 08/18/2022  END OF SESSION:  PT End of Session - 08/17/22 1757     Visit Number 1    Number of Visits 3    Date for PT Re-Evaluation 09/28/22    Authorization Type [employer]    PT Start Time 1615    PT Stop Time 1650    PT Time Calculation (min) 35 min    Activity Tolerance Patient tolerated treatment well    Behavior During Therapy WFL for tasks assessed/performed             Past Medical History:  Diagnosis Date   Allergy    Anxiety    Asthma    with activity Mild   Colon polyps    GERD (gastroesophageal reflux disease)    Seasonal allergies    Past Surgical History:  Procedure Laterality Date   COLONOSCOPY     UPPER GASTROINTESTINAL ENDOSCOPY     Patient Active Problem List   Diagnosis Date Noted   Rosacea 05/03/2022   Seborrheic keratoses 05/03/2022   Calcific tendinitis of lower leg, right 02/17/2022   Greater trochanteric bursitis of right hip 07/08/2021   Aortic atherosclerosis (HCC) 04/10/2021   Whiplash 12/09/2020   Foot contusion 12/09/2020   Right knee pain 06/04/2020   Mild sleep apnea 05/27/2020   GAD (generalized anxiety disorder) 03/17/2020   Major depressive disorder with single episode, in full remission (HCC) 03/17/2020   Hamstring tightness of right lower extremity 11/02/2019   Hyperlipidemia, unspecified 07/03/2019   Callus of foot 05/17/2019   Vitamin D deficiency 03/21/2018   B12 deficiency 03/21/2018   Poor posture 12/22/2017   Capsulitis of metatarsophalangeal (MTP) joint of left foot 12/22/2017   Nonallopathic lesion of cervical region 10/28/2017   Nonallopathic lesion of rib cage 10/28/2017   Tibialis posterior tendinitis, right 08/30/2017   Extensor intersection syndrome of right wrist 02/24/2017   ECU (extensor carpi ulnaris), subluxation/dislocation, right, initial encounter 07/19/2016    Lumbar radiculopathy 08/14/2015   Nonallopathic lesion of thoracic region 05/19/2015   Nonallopathic lesion of lumbosacral region 05/19/2015   Nonallopathic lesion of sacral region 05/19/2015   Piriformis syndrome of right side 04/24/2015   Allergic rhinitis due to pollen 04/23/2015   Extrinsic asthma 08/11/2010   Insomnia 08/11/2010   GERD 12/08/2007   Irritable bowel syndrome 12/08/2007    PCP: Shelva Majestic, MD  REFERRING PROVIDER: Richardean Sale, DO  REFERRING DIAG:  [phone] (ICD-10-CM) - Chronic bilateral low back pain without sciatica M99.03 (ICD-10-CM) - Somatic dysfunction of lumbar region M99.05 (ICD-10-CM) - Somatic dysfunction of pelvic region M99.04 (ICD-10-CM) - Somatic dysfunction of sacral region  Rationale for Evaluation and Treatment: Rehabilitation  THERAPY DIAG:  Other low back pain  Muscle weakness (generalized)  ONSET DATE: Chronic  SUBJECTIVE:  SUBJECTIVE STATEMENT: Pt presents to PT with reports of onset of acute LBP approximately two months ago, although of recent weeks has decreased. Was working out and noted the next day that his back was very painful to certain motions with decreased mobility as well. Pt is well known to therapist and has done well with therapy in past. Denies bowel/bladder changes or saddle anesthesia. Pain localized to L side of lower back. Pt would like to start a core strengthening program to decrease instances of LBP in future.   PERTINENT HISTORY:  None  PAIN:  Are you having pain?  Yes: NPRS scale: 0/10 Worst: 7/10 Pain location: L and midline lower back Pain description: sharp, tight Aggravating factors: flexion Relieving factors: rest  PRECAUTIONS: None  WEIGHT BEARING RESTRICTIONS: No  FALLS:  Has patient fallen in  last 6 months? No  LIVING ENVIRONMENT: Lives with: lives with their family Lives in: House/apartment  OCCUPATION: Professor at [employer]  PLOF: Independent  PATIENT GOALS: get an exercise program for core strengthening to decrease back pain  OBJECTIVE:   DIAGNOSTIC FINDINGS:  N/A  PATIENT SURVEYS:  N/A  COGNITION: Overall cognitive status: Within functional limits for tasks assessed     SENSATION: WFL  MUSCLE LENGTH: Thomas test: Right (+) deg; Left (+) deg  POSTURE: increased lumbar lordosis  PALPATION: No overt TTP noted; decreased lumbar spine segmental mobility noted during spring testing  LUMBAR ROM:   AROM eval  Flexion WFL  Extension WFL  Right lateral flexion WFL  Left lateral flexion WFL  Right rotation   Left rotation    (Blank rows = not tested)  LOWER EXTREMITY MMT:    MMT Right eval Left eval  Hip flexion 5/5 5/5  Hip extension    Hip abduction 5/5 5/5  Hip adduction    Hip internal rotation    Hip external rotation    Knee flexion    Knee extension    Ankle dorsiflexion    Ankle plantarflexion    Ankle inversion    Ankle eversion     (Blank rows = not tested)  LUMBAR SPECIAL TESTS:  Prone instability test: Negative, Straight leg raise test: Negative, and Slump test: Negative  FUNCTIONAL TESTS:  90/90 hold: 30 sec Front plank: 30 sec  GAIT: Distance walked: 23ft Assistive device utilized: None Level of assistance: Complete Independence Comments: no deviations noted  TREATMENT: [facility] Adult PT Treatment:                                                DATE: 08/17/2022 Therapeutic Exercise: Supine PPT x 5 - 5" hold 90/90 table top hold x 30"  90/90 alt taps x 10  Dead bug with pball x 5 Side plank on knees x 30"  Bird dog x 5 Child's pose x 30"  Modified thomas stretch x 30" R  PATIENT EDUCATION:  Education details: eval findings, FOTO, HEP, POC Person educated: Patient Education method: Explanation, Demonstration, and  Handouts Education comprehension: verbalized understanding and returned demonstration  HOME EXERCISE PROGRAM: Access Code: [facility] URL: [email or web address] Date: 08/17/2022 Prepared by: Edwinna Areola  Exercises - Supine Posterior Pelvic Tilt  - 1 x daily - 7 x weekly - 2 sets - 10 reps - 5 sec hold - Supine 90/90 Abdominal Bracing  - 1 x daily - 7 x weekly - 3 sets -  30 sec hold - Supine 90/90 Alternating Heel Touches with Posterior Pelvic Tilt  - 1 x daily - 7 x weekly - 2 sets - 10 reps - Dead Bug with Swiss Ball  - 1 x daily - 7 x weekly - 3 sets - 10 reps - Side Plank on Knees  - 1 x daily - 7 x weekly - 2-3 sets - 30 hold - Bird Dog  - 1 x daily - 7 x weekly - 3 sets - 10 reps - Child's Pose Stretch  - 1 x daily - 7 x weekly - 3 sets - 30 sec hold - Modified Thomas Stretch  - 1 x daily - 7 x weekly - 3 reps - 60 sec hold  ASSESSMENT:  CLINICAL IMPRESSION: Patient is a 56 y.o. M who was seen today for physical therapy evaluation and treatment for acute onset of LBP. Physical findings are fairly consistent with subjective findings, as pt has decrease hip flexor muscle length and lumbar spinal segmental mobility and core weakness with prolonged static neutral spine positioning. He would benefit from short PT POC working on creating a thorough HEP designed for core stabilization and neutral spine strengthening.   OBJECTIVE IMPAIRMENTS: decreased activity tolerance, decreased mobility, improper body mechanics, and pain.   ACTIVITY LIMITATIONS: standing, squatting, transfers, and locomotion level  PARTICIPATION LIMITATIONS: shopping, community activity, occupation, yard work, and gym exericse  PERSONAL FACTORS:  None  REHAB POTENTIAL: Excellent  CLINICAL DECISION MAKING: Stable/uncomplicated  EVALUATION COMPLEXITY: Low   GOALS: Goals reviewed with patient? No  SHORT TERM GOALS: Target date: 09/07/2022   Pt will be compliant and knowledgeable with initial HEP  for improved comfort and carryover Baseline: initial HEP given  Goal status: INITIAL  2.  Pt will self report low back pain no greater than 3/10 for improved comfort and functional ability Baseline: 7/10 at worst Goal status: INITIAL    LONG TERM GOALS: Target date: 09/28/2022   Pt will self report lower back pain no greater than 1/10 for improved comfort and functional ability Baseline: 7/10 at worst Goal status: INITIAL   2.  Pt will be compliant and knowledgeable with advanced  HEP for improved comfort and carryover Baseline: initial HEP given  Goal status: INITIAL  3.  Pt will improve core strength to hold front plank for >60 seconds for improved stabilization and decrease LBP Baseline: 30 sec Goal status: INITIAL   PLAN:  PT FREQUENCY: every other week  PT DURATION: 4 weeks  PLANNED INTERVENTIONS: Therapeutic exercises, Therapeutic activity, Neuromuscular re-education, Balance training, Gait training, Patient/Family education, Self Care, Joint mobilization, Dry Needling, Electrical stimulation, and Manual therapy.  PLAN FOR NEXT SESSION: assess HEP response, progressive neutral spine core strength    Eloy End, PT 08/18/2022, 7:37 AM

## 2022-09-01 ENCOUNTER — Ambulatory Visit: Payer: No Typology Code available for payment source

## 2022-09-23 ENCOUNTER — Other Ambulatory Visit: Payer: Self-pay | Admitting: Family Medicine

## 2022-10-22 ENCOUNTER — Encounter: Payer: Self-pay | Admitting: Family Medicine

## 2022-10-24 ENCOUNTER — Other Ambulatory Visit: Payer: Self-pay | Admitting: Family Medicine

## 2022-10-25 MED ORDER — ALBUTEROL SULFATE HFA 108 (90 BASE) MCG/ACT IN AERS: 2.0000 | INHALATION_SPRAY | Freq: Four times a day (QID) | RESPIRATORY_TRACT | 2 refills | Status: AC | PRN

## 2022-10-27 ENCOUNTER — Ambulatory Visit: Payer: No Typology Code available for payment source | Admitting: Family Medicine

## 2022-10-28 NOTE — Progress Notes (Signed)
[name] [address] [place] [address] Phone: [phone] Subjective:   George [name], am serving as a scribe for Dr. Antoine Primas.  I'm seeing this patient by the request  of:  Shelva Majestic, MD  CC: back and neck pain follow up   [id number]  George [name] is a 57 y.o. male coming in with complaint of back and neck pain. OMT 08/10/2022. Patient states that his back is doing fine.   Pain in L thumb in IP joint. Painful to grip but pain is improving.   Medications patient has been prescribed: Prednisone, Flexeril  Taking:         Reviewed prior external information including notes and imaging from previsou exam, outside providers and external EMR if available.   As well as notes that were available from care everywhere and other healthcare systems.  Past medical history, social, surgical and family history all reviewed in electronic medical record.  No pertanent information unless stated regarding to the chief complaint.   Past Medical History:  Diagnosis Date   Allergy    Anxiety    Asthma    with activity Mild   Colon polyps    GERD (gastroesophageal reflux disease)    Seasonal allergies     Allergies  Allergen Reactions   Trazodone And Nefazodone     Syncopal episode      Review of Systems:  No headache, visual changes, nausea, vomiting, diarrhea, constipation, dizziness, abdominal pain, skin rash, fevers, chills, night sweats, weight loss, swollen lymph nodes, body aches, joint swelling, chest pain, shortness of breath, mood changes. POSITIVE muscle aches  Objective  Blood pressure 108/78, pulse 73, height 5\' 8"  (1.727 m), weight 183 lb (83 kg), SpO2 97%.   General: No apparent distress alert and oriented x3 mood and affect normal, dressed appropriately.  HEENT: Pupils equal, extraocular movements intact  Respiratory: Patient's speak in full sentences and does not appear short of breath  Cardiovascular: No  lower extremity edema, non tender, no erythema  MSK:  Back does have some loss lordosis noted.  Neck exam does have tightness with sidebending.  On core strength though is doing relatively well.  Osteopathic findings  C2 flexed rotated and side bent right C6 flexed rotated and side bent left T3 extended rotated and side bent right inhaled rib T9 extended rotated and side bent left L2 flexed rotated and side bent right Sacrum right on right       Assessment and Plan:  Poor posture Patient has been doing much better.  Not have any radicular symptoms.  Patient was traveling all summer and likely was more beneficial for this individual.  Discussed icing regimen and home exercises, which activities to do and which ones to avoid.  We discussed increasing activity as tolerated.  Follow-up with me again in 6 to 8 weeks otherwise.    Nonallopathic problems  Decision today to treat with OMT was based on Physical Exam  After verbal consent patient was treated with HVLA, ME, FPR techniques in cervical, rib, thoracic, lumbar, and sacral  areas  Patient tolerated the procedure well with improvement in symptoms  Patient given exercises, stretches and lifestyle modifications  See medications in patient instructions if given  Patient will follow up in 4-8 weeks     The above documentation has been reviewed and is accurate and complete Judi Saa, DO         Note: This dictation was prepared with  Dragon dictation along with smaller Lobbyist. Any transcriptional errors that result from this process are unintentional.

## 2022-10-29 IMAGING — CT CT CARDIAC CORONARY ARTERY CALCIUM SCORE
3 series · 14 of 20 positions shown, 16 images · non-contrast
Comparison: None.
COMPARISON: None.

Addendum:
EXAM:
OVER-READ INTERPRETATION  CT CHEST

The following report is an over-read performed by radiologist Dr.
Amnon Tiger [REDACTED] on 12/25/2020. This
over-read does not include interpretation of cardiac or coronary
anatomy or pathology. The coronary calcium score interpretation by
the cardiologist is attached.
CLINICAL DATA: 55M for cardiovascular disease risk stratification
Coronary Calcium Score
TECHNIQUE: A gated, non-contrast computed tomography scan of the heart was
performed using 3mm slice thickness. Axial images were analyzed on a
dedicated workstation. Calcium scoring of the coronary arteries was
performed using the Agatston method.

[Series 2: cascseq 2.0 sa36 70% (id) · axial · 0.43mm/px · z∈[-202,-122]mm · 4 of 68 slices shown]
[im 14/68  vessel]
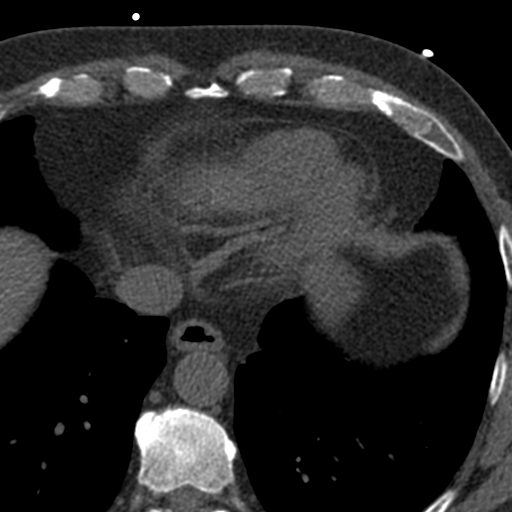
[im 27/68  vessel]
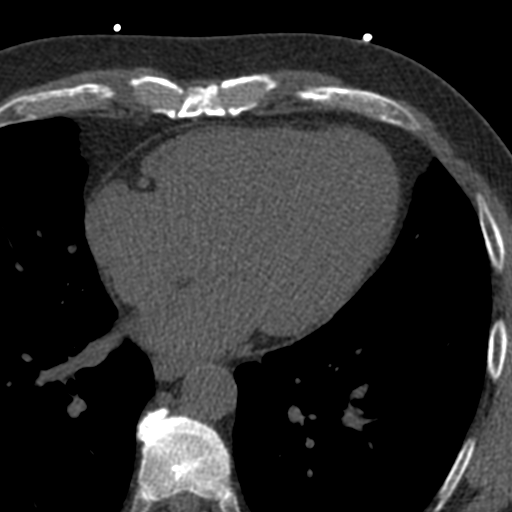
[im 41/68  vessel]
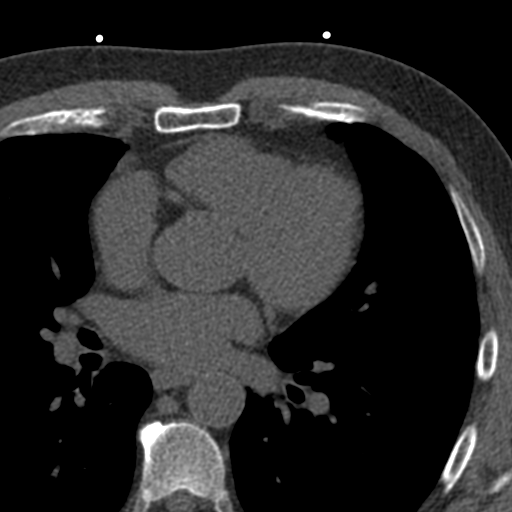
[im 54/68  vessel]
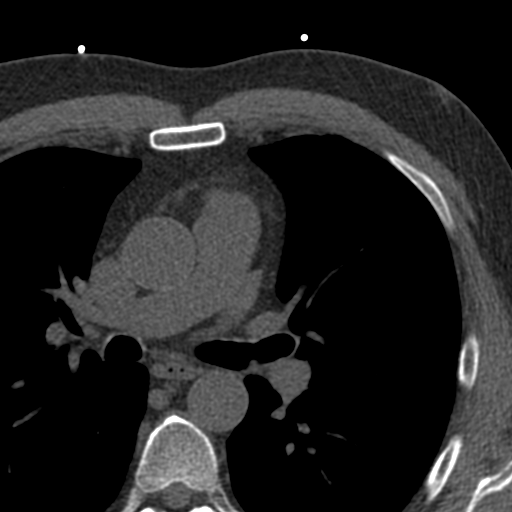

[Series 3: cascseq 2.0 bf37 st · axial · 0.72mm/px · z∈[-206,-118]mm · 5 of 68 slices shown, 7 images]
[im 12/68  vessel]
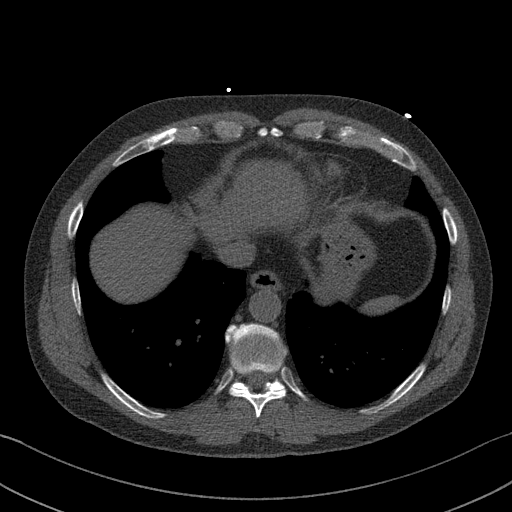
[im 12/68  lung]
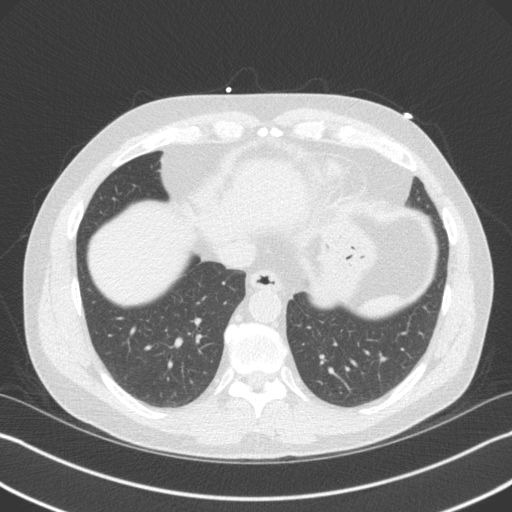
[im 23/68  vessel]
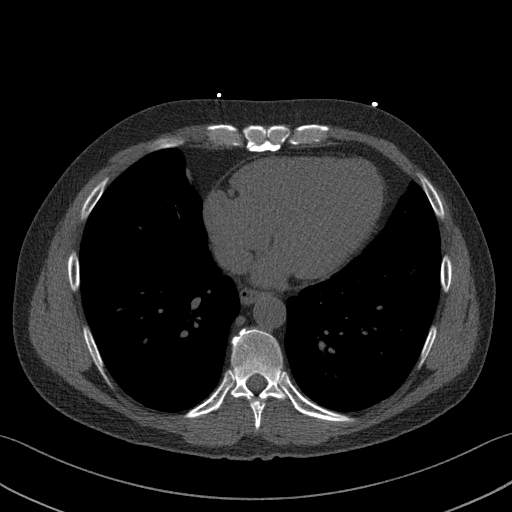
[im 34/68  vessel]
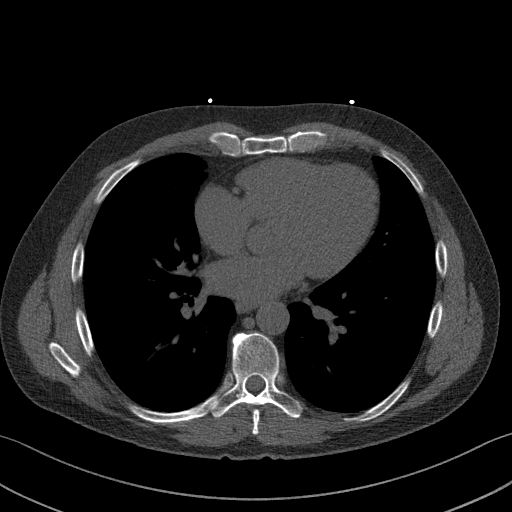
[im 45/68  vessel]
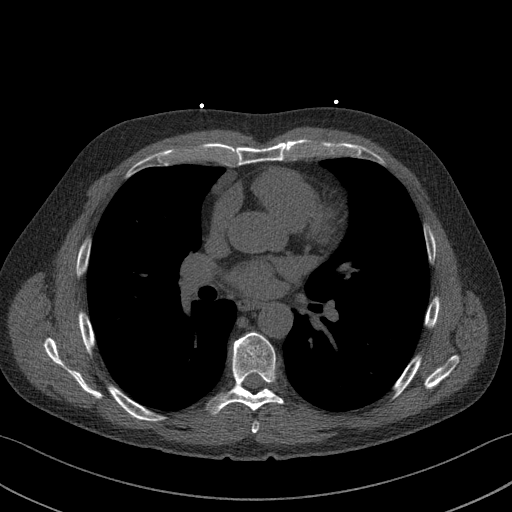
[im 56/68  vessel]
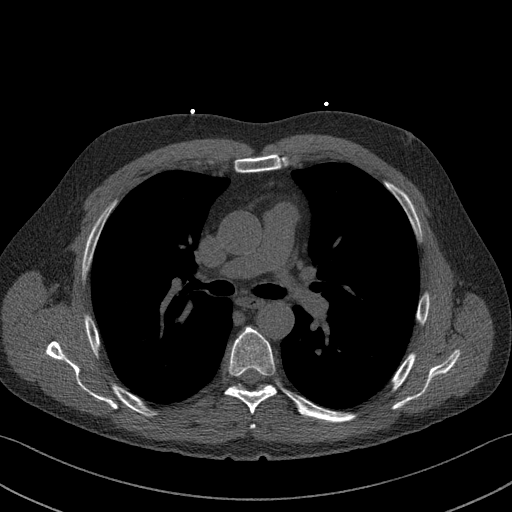
[im 56/68  lung]
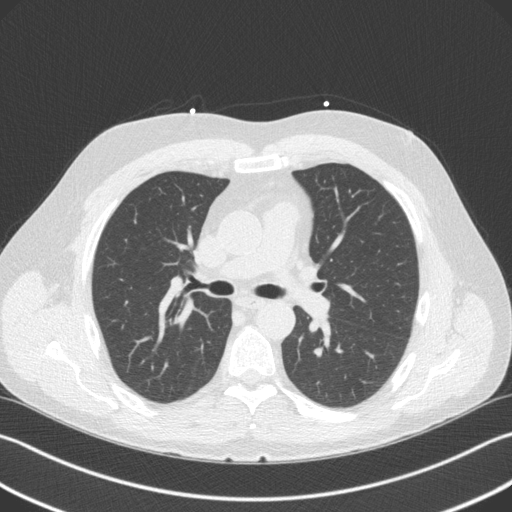

[Series 4: cascseq 2.0 br59 lung · axial · 0.72mm/px · z∈[-206,-118]mm · 5 of 68 slices shown]
[im 12/68  lung]
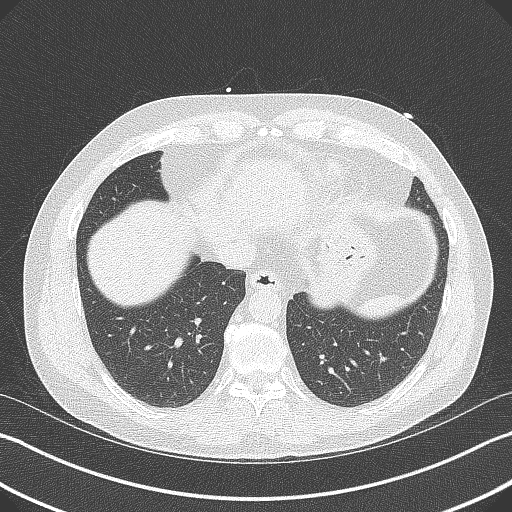
[im 23/68  lung]
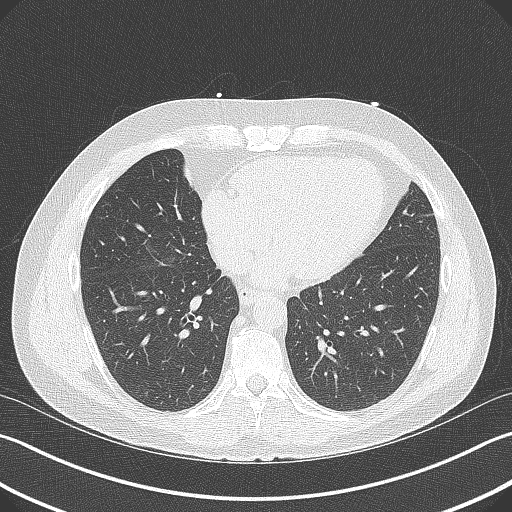
[im 34/68  lung]
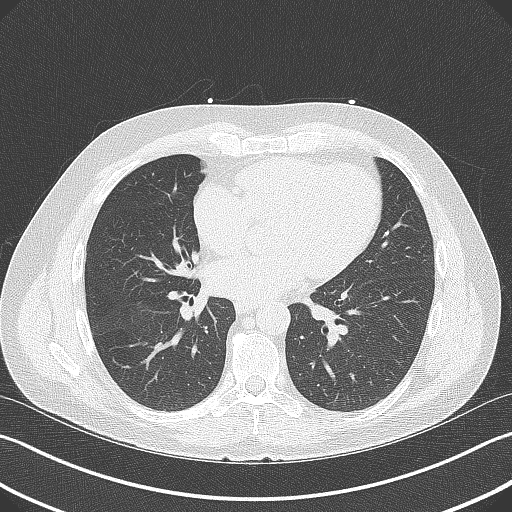
[im 45/68  lung]
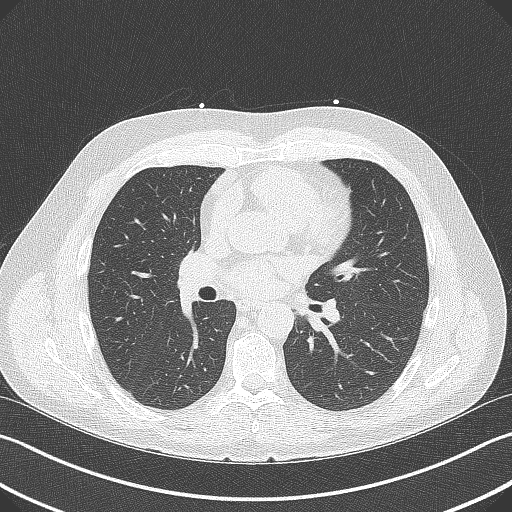
[im 56/68  lung]
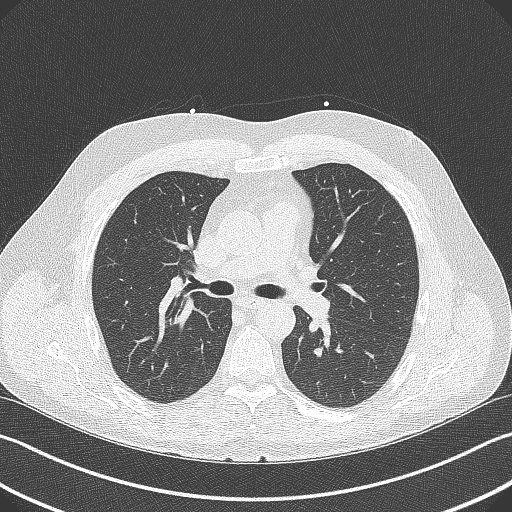

[14 of 20 positions shown; findings below may reference images not displayed]

FINDINGS: Well-defined ovoid shaped lesion measuring 0.8 x 2.3 cm (axial image
22 of series 3) which demonstrates low-attenuation adjacent to the
right atrial appendage, intimately associated with the pericardium,
likely a small pericardial cyst. Within the visualized portions of
the thorax there are no suspicious appearing pulmonary nodules or
masses, there is no acute consolidative airspace disease, no pleural
effusions, no pneumothorax and no lymphadenopathy. Visualized
portions of the upper abdomen are unremarkable. There are no
aggressive appearing lytic or blastic lesions noted in the
visualized portions of the skeleton.
IMPRESSION: 1. Probable small pericardial cyst, as above.
FINDINGS: Coronary arteries: Normal origins.

Coronary Calcium Score:

Left main:

Left anterior descending artery:

Left circumflex artery: 0

Right coronary artery:

Total:

Percentile: 81st

Pericardium: Normal.

Ascending Aorta: Normal caliber.  Atherosclerosis of the aorta.

Non-cardiac: See separate report from [REDACTED].
IMPRESSION: Coronary calcium score of 97.7. This was 81st percentile for age-,
race-, and sex-matched controls.

Atherosclerosis of the aorta.



If CAC=0, it is reasonable to withhold statin therapy and reassess
in 5 to 10 years, as long as higher risk conditions are absent
(diabetes mellitus, family history of premature CHD in first degree
relatives (males <55 years; females <65 years), cigarette smoking,
or LDL >=190 mg/dL).

If CAC is 1 to 99, it is reasonable to initiate statin therapy for
patients >=55 years of age.

If CAC is >=100 or >=75th percentile, it is reasonable to initiate
statin therapy at any age.

Cardiology referral should be considered for patients with CAC
scores >=400 or >=75th percentile.

*9865 AHA/ACC/AACVPR/AAPA/ABC/KABBA/[place]THIA/XONN/[place]
Guideline on the Management of Blood Cholesterol: A Report of the
American College of Cardiology/American Heart Association Task Force
on Clinical Practice Guidelines. J Am Coll Cardiol.
1955;73(24):7227-7032.

*** End of Addendum ***
EXAM:
OVER-READ INTERPRETATION  CT CHEST

The following report is an over-read performed by radiologist Dr.
Amnon Tiger [REDACTED] on 12/25/2020. This
over-read does not include interpretation of cardiac or coronary
anatomy or pathology. The coronary calcium score interpretation by
the cardiologist is attached.
FINDINGS: Well-defined ovoid shaped lesion measuring 0.8 x 2.3 cm (axial image
22 of series 3) which demonstrates low-attenuation adjacent to the
right atrial appendage, intimately associated with the pericardium,
likely a small pericardial cyst. Within the visualized portions of
the thorax there are no suspicious appearing pulmonary nodules or
masses, there is no acute consolidative airspace disease, no pleural
effusions, no pneumothorax and no lymphadenopathy. Visualized
portions of the upper abdomen are unremarkable. There are no
aggressive appearing lytic or blastic lesions noted in the
visualized portions of the skeleton.
IMPRESSION: 1. Probable small pericardial cyst, as above.

## 2022-11-03 ENCOUNTER — Ambulatory Visit: Payer: No Typology Code available for payment source | Admitting: Family Medicine

## 2022-11-03 ENCOUNTER — Encounter: Payer: Self-pay | Admitting: Family Medicine

## 2022-11-03 VITALS — BP 108/78 | HR 73 | Ht 68.0 in | Wt 183.0 lb

## 2022-11-03 DIAGNOSIS — R293 Abnormal posture: Secondary | ICD-10-CM | POA: Diagnosis not present

## 2022-11-03 DIAGNOSIS — M9903 Segmental and somatic dysfunction of lumbar region: Secondary | ICD-10-CM | POA: Diagnosis not present

## 2022-11-03 DIAGNOSIS — M9908 Segmental and somatic dysfunction of rib cage: Secondary | ICD-10-CM | POA: Diagnosis not present

## 2022-11-03 DIAGNOSIS — M9902 Segmental and somatic dysfunction of thoracic region: Secondary | ICD-10-CM | POA: Diagnosis not present

## 2022-11-03 DIAGNOSIS — M9901 Segmental and somatic dysfunction of cervical region: Secondary | ICD-10-CM

## 2022-11-03 DIAGNOSIS — M9904 Segmental and somatic dysfunction of sacral region: Secondary | ICD-10-CM

## 2022-11-03 NOTE — Assessment & Plan Note (Signed)
Patient has been doing much better.  Not have any radicular symptoms.  Patient was traveling all summer and likely was more beneficial for this individual.  Discussed icing regimen and home exercises, which activities to do and which ones to avoid.  We discussed increasing activity as tolerated.  Follow-up with me again in 6 to 8 weeks otherwise.

## 2022-11-24 ENCOUNTER — Ambulatory Visit: Payer: No Typology Code available for payment source | Admitting: Family Medicine

## 2022-12-14 NOTE — Progress Notes (Unsigned)
[facility] [address] [place] [address] Phone: [phone] Subjective:   [name], am serving as a scribe for Dr. Antoine Primas.  I'm seeing this patient by the request  of:  Shelva Majestic, MD  CC: Back and neck pain, hand pain  [id number]  Lyndel [name] is a 57 y.o. male coming in with complaint of back and neck pain. OMT 11/03/2022. Patient states hip is better. The R knee is starting to feel the discomfort again. Shockwave helped. L thumb is hurting distal to [facility] joint  Medications patient has been prescribed: Gabapentin  Taking: Intermittently         Reviewed prior external information including notes and imaging from previsou exam, outside providers and external EMR if available.   As well as notes that were available from care everywhere and other healthcare systems.  Past medical history, social, surgical and family history all reviewed in electronic medical record.  No pertanent information unless stated regarding to the chief complaint.   Past Medical History:  Diagnosis Date   Allergy    Anxiety    Asthma    with activity Mild   Colon polyps    GERD (gastroesophageal reflux disease)    Seasonal allergies     Allergies  Allergen Reactions   Trazodone And Nefazodone     Syncopal episode      Review of Systems:  No headache, visual changes, nausea, vomiting, diarrhea, constipation, dizziness, abdominal pain, skin rash, fevers, chills, night sweats, weight loss, swollen lymph nodes, body aches, joint swelling, chest pain, shortness of breath, mood changes. POSITIVE muscle aches  Objective  Blood pressure 124/86, pulse 76, height 5\' 8"  (1.727 m), weight 184 lb (83.5 kg), SpO2 99%.   General: No apparent distress alert and oriented x3 mood and affect normal, dressed appropriately.  HEENT: Pupils equal, extraocular movements intact  Respiratory: Patient's speak in full sentences and does not appear short  of breath  Cardiovascular: No lower extremity edema, non tender, no erythema  Upper back does have significant tightness within the parascapular area bilaterally.  Some tightness noted in the right side of her lower back mostly around the sacroiliac joint.  Osteopathic findings  C3 flexed rotated and side bent right C7 flexed rotated and side bent left T3 extended rotated and side bent right inhaled rib T9 extended rotated and side bent left L2 flexed rotated and side bent right L3 flexed rotated and side bent left Sacrum right on right  Left hand exam shows the patient does have some tenderness over the IP joint of the thumb mostly on the flexor aspect.  Limited muscular skeletal ultrasound was performed and interpreted by Antoine Primas, M  Limited ultrasound shows the patient does have a mild hypoechoic changes of the tendon sheath of the flexor tendon of the left thumb.  No bony abnormalities noted.  No calcific changes. Impression and plan early trigger thumb   Assessment and Plan:  Trigger thumb of left hand Discussed warm compresses and massage, bracing at night, will continue to monitor ergonomics and consider the possibility of injections if needed.  Likely will do well with conservative therapy.  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  Discussed standing desk where appropriate.  Continue to be active.  Doing more weightlifting which I do think will be beneficial.  Follow-up again in 6 to 8 weeks  Nonallopathic problems  Decision today to treat with OMT was based on Physical Exam  After verbal consent patient was treated with HVLA, ME, FPR techniques in cervical, rib, thoracic, lumbar, and sacral  areas  Patient tolerated the procedure well with improvement in symptoms  Patient given exercises, stretches and lifestyle modifications  See medications in patient instructions if given  Patient  will follow up in 4-8 weeks     The above documentation has been reviewed and is accurate and complete Judi Saa, DO         Note: This dictation was prepared with Dragon dictation along with smaller phrase technology. Any transcriptional errors that result from this process are unintentional.

## 2022-12-16 ENCOUNTER — Ambulatory Visit: Payer: No Typology Code available for payment source | Admitting: Family Medicine

## 2022-12-16 ENCOUNTER — Encounter: Payer: Self-pay | Admitting: Family Medicine

## 2022-12-16 ENCOUNTER — Other Ambulatory Visit: Payer: Self-pay

## 2022-12-16 VITALS — BP 124/86 | HR 76 | Ht 68.0 in | Wt 184.0 lb

## 2022-12-16 DIAGNOSIS — M9908 Segmental and somatic dysfunction of rib cage: Secondary | ICD-10-CM | POA: Diagnosis not present

## 2022-12-16 DIAGNOSIS — M65312 Trigger thumb, left thumb: Secondary | ICD-10-CM

## 2022-12-16 DIAGNOSIS — M9902 Segmental and somatic dysfunction of thoracic region: Secondary | ICD-10-CM

## 2022-12-16 DIAGNOSIS — R293 Abnormal posture: Secondary | ICD-10-CM

## 2022-12-16 DIAGNOSIS — G8929 Other chronic pain: Secondary | ICD-10-CM | POA: Diagnosis not present

## 2022-12-16 DIAGNOSIS — M9901 Segmental and somatic dysfunction of cervical region: Secondary | ICD-10-CM

## 2022-12-16 DIAGNOSIS — M9904 Segmental and somatic dysfunction of sacral region: Secondary | ICD-10-CM | POA: Diagnosis not present

## 2022-12-16 DIAGNOSIS — M79645 Pain in left finger(s): Secondary | ICD-10-CM | POA: Diagnosis not present

## 2022-12-16 DIAGNOSIS — M9903 Segmental and somatic dysfunction of lumbar region: Secondary | ICD-10-CM | POA: Diagnosis not present

## 2022-12-16 NOTE — Patient Instructions (Signed)
Splint for thumb at night Warm compress and massage the thumb Consider voice recognition software See you again in 6 weeks

## 2022-12-16 NOTE — Assessment & Plan Note (Signed)
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  Discussed standing desk where appropriate.  Continue to be active.  Doing more weightlifting which I do think will be beneficial.  Follow-up again in 6 to 8 weeks

## 2022-12-16 NOTE — Assessment & Plan Note (Signed)
Discussed warm compresses and massage, bracing at night, will continue to monitor ergonomics and consider the possibility of injections if needed.  Likely will do well with conservative therapy.

## 2023-01-20 ENCOUNTER — Other Ambulatory Visit: Payer: Self-pay | Admitting: Sports Medicine

## 2023-01-20 NOTE — Progress Notes (Signed)
[name] [address] [place] [address] Phone: [phone] Subjective:   George [name], am serving as a scribe for Dr. Antoine Primas.  I'm seeing this patient by the request  of:  Shelva Majestic, MD  CC: back and neck pain follow up   [id number]  George [name] is a 57 y.o. male coming in with complaint of back and neck pain. OMT 01/05/2023. Also f/u for L trigger thumb pain. Patient states doing better with thumb. Everything else is same per usual. No new concerns.  Medications patient has been prescribed: None         Reviewed prior external information including notes and imaging from previsou exam, outside providers and external EMR if available.   As well as notes that were available from care everywhere and other healthcare systems.  Past medical history, social, surgical and family history all reviewed in electronic medical record.  No pertanent information unless stated regarding to the chief complaint.   Past Medical History:  Diagnosis Date   Allergy    Anxiety    Asthma    with activity Mild   Colon polyps    GERD (gastroesophageal reflux disease)    Seasonal allergies     Allergies  Allergen Reactions   Trazodone And Nefazodone     Syncopal episode      Review of Systems:  No headache, visual changes, nausea, vomiting, diarrhea, constipation, dizziness, abdominal pain, skin rash, fevers, chills, night sweats, weight loss, swollen lymph nodes, body aches, joint swelling, chest pain, shortness of breath, mood changes. POSITIVE muscle aches some worsening reflux disease  Objective  Blood pressure 122/84, pulse 81, height 5\' 8"  (1.727 m), weight 185 lb (83.9 kg), SpO2 97%.   General: No apparent distress alert and oriented x3 mood and affect normal, dressed appropriately.  HEENT: Pupils equal, extraocular movements intact  Respiratory: Patient's speak in full sentences and does not appear short of breath   Cardiovascular: No lower extremity edema, non tender, no erythema  Gait MSK:  Back patient is neck exam does have some loss lordosis noted.  Does have tightness with sidebending bilaterally.  Negative crepitus noted.  Patient does though have some low back pain noted as well.  More of a tightness than true pain.  Osteopathic findings  C3 flexed rotated and side bent right C7 flexed rotated and side bent left T3 extended rotated and side bent right inhaled rib T5 extended rotated and side bent left L2 flexed rotated and side bent right Sacrum right on right     Assessment and Plan:  Poor posture Still working on posture.  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  We discussed continuing to work on posture and ergonomics, which activities to do and which ones to avoid.  Increase activity slowly otherwise.  Follow-up again in 6 to 8 weeks    Nonallopathic problems  Decision today to treat with OMT was based on Physical Exam  After verbal consent patient was treated with HVLA, ME, FPR techniques in cervical, rib, thoracic, lumbar, and sacral  areas  Patient tolerated the procedure well with improvement in symptoms  Patient given exercises, stretches and lifestyle modifications  See medications in patient instructions if given  Patient will follow up in 4-8 weeks     The above documentation has been reviewed and is accurate  and complete Judi Saa, DO         Note: This dictation was prepared with Dragon dictation along with smaller phrase technology. Any transcriptional errors that result from this process are unintentional.

## 2023-01-24 ENCOUNTER — Encounter: Payer: Self-pay | Admitting: Family Medicine

## 2023-01-24 ENCOUNTER — Ambulatory Visit: Payer: No Typology Code available for payment source | Admitting: Family Medicine

## 2023-01-24 VITALS — BP 122/84 | HR 81 | Ht 68.0 in | Wt 185.0 lb

## 2023-01-24 DIAGNOSIS — M9908 Segmental and somatic dysfunction of rib cage: Secondary | ICD-10-CM | POA: Diagnosis not present

## 2023-01-24 DIAGNOSIS — M9904 Segmental and somatic dysfunction of sacral region: Secondary | ICD-10-CM

## 2023-01-24 DIAGNOSIS — K219 Gastro-esophageal reflux disease without esophagitis: Secondary | ICD-10-CM | POA: Diagnosis not present

## 2023-01-24 DIAGNOSIS — R293 Abnormal posture: Secondary | ICD-10-CM

## 2023-01-24 DIAGNOSIS — M9903 Segmental and somatic dysfunction of lumbar region: Secondary | ICD-10-CM | POA: Diagnosis not present

## 2023-01-24 DIAGNOSIS — M9901 Segmental and somatic dysfunction of cervical region: Secondary | ICD-10-CM | POA: Diagnosis not present

## 2023-01-24 DIAGNOSIS — M9902 Segmental and somatic dysfunction of thoracic region: Secondary | ICD-10-CM

## 2023-01-24 NOTE — Assessment & Plan Note (Signed)
Discussed over-the-counter medications.  Worsening symptoms would need to follow-up with primary care or GI

## 2023-01-24 NOTE — Patient Instructions (Addendum)
Good to see you! Nexium 2x daily for 2 weeks See you again in 8 weeks

## 2023-01-24 NOTE — Assessment & Plan Note (Signed)
Still working on posture.  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  We discussed continuing to work on posture and ergonomics, which activities to do and which ones to avoid.  Increase activity slowly otherwise.  Follow-up again in 6 to 8 weeks

## 2023-01-25 ENCOUNTER — Encounter: Payer: Self-pay | Admitting: Family Medicine

## 2023-01-25 NOTE — Telephone Encounter (Signed)
Pt has been scheduled for 01/31/23 @ 4pm w/ [facility].

## 2023-01-27 NOTE — Telephone Encounter (Signed)
Patient called for an update on form. Would like to come pick it up ASAP.

## 2023-01-31 ENCOUNTER — Encounter: Payer: Self-pay | Admitting: Family

## 2023-01-31 ENCOUNTER — Ambulatory Visit (INDEPENDENT_AMBULATORY_CARE_PROVIDER_SITE_OTHER): Payer: No Typology Code available for payment source | Admitting: Family

## 2023-01-31 VITALS — BP 130/78 | HR 78 | Temp 98.3°F | Ht 69.0 in | Wt 187.6 lb

## 2023-01-31 DIAGNOSIS — Z23 Encounter for immunization: Secondary | ICD-10-CM

## 2023-01-31 DIAGNOSIS — K219 Gastro-esophageal reflux disease without esophagitis: Secondary | ICD-10-CM

## 2023-01-31 NOTE — Progress Notes (Signed)
Patient ID: George [name], male    DOB: 1965-03-22, 57 y.o.   MRN: [id number]  Chief Complaint  Patient presents with   Acid reflux    Discussed the use of AI scribe software for clinical note transcription with the patient, who gave verbal consent to proceed.  History of Present Illness   The patient, with a history of reflux and asthma, presents with worsening reflux symptoms. He reports that his reflux symptoms were 'pretty bad' but improved significantly after starting over-the-counter omeprazole twice daily for two weeks. However, he experienced a flare-up of symptoms after consuming pizza. He has attempted dietary modifications, including reducing caffeine intake and switching from coffee to tea, but admits to struggling with these changes. He also reports a history of asthma, which is typically mild and exercise-induced. He has been using Symbicort and albuterol as needed, which seems to help.  He also reports a large amount of stress at work over the last 2 months.     Assessment & Plan:     Gastroesophageal Reflux Disease (GERD) -  Recent exacerbation of symptoms, possibly related to diet and stress. Currently on Dexilant and over-the-counter omeprazole with some improvement. -Continue Dexilant as currently prescribed every morning. -Add famotidine 40mg  at bedtime for additional acid control (pt has at home) for the next 1-2w, then ok to reduce to 20mg  qhs or every other night to control sx. -Work on continued dietary modifications to avoid trigger foods and beverages. -Work on reducing stress as able with exercise, deep breathing, meditation. -F/U with PCP for ongoing sx.  General Health Maintenance - -Administer shingles vaccine today.     Subjective:    Outpatient Medications Prior to Visit  Medication Sig Dispense Refill   albuterol (VENTOLIN HFA) 108 (90 Base) MCG/ACT inhaler Inhale 2 puffs into the lungs every 6 (six) hours as needed for wheezing or shortness of breath. 1  each 2   budesonide-formoterol (SYMBICORT) 160-4.5 MCG/ACT inhaler INHALE 2 PUFFS INTO THE LUNGS TWICE A DAY 30.6 each 3   buPROPion (WELLBUTRIN XL) 150 MG 24 hr tablet TAKE 1 TABLET BY MOUTH EVERY MORNING AFTER BREAKFAST Oral for 30 Days     cetirizine (ZYRTEC) 10 MG tablet Take 10 mg by mouth at bedtime.     cyanocobalamin (VITAMIN B12) 500 MCG tablet Take 1,000 mcg by mouth daily.     cyclobenzaprine (FLEXERIL) 5 MG tablet Take 1 tablet (5 mg total) by mouth 2 (two) times daily as needed for muscle spasms. 90 tablet 0   DEXILANT 60 MG capsule TAKE 1 CAPSULE BY MOUTH EVERY DAY 90 capsule 0   escitalopram (LEXAPRO) 20 MG tablet TAKE 1 TABLET BY MOUTH EVERY DAY 90 tablet 3   fluticasone (FLONASE) 50 MCG/ACT nasal spray Place 2 sprays into both nostrils daily. 16 g 11   gabapentin (NEURONTIN) 100 MG capsule TAKE 2 CAPSULES BY MOUTH AT BEDTIME. 180 capsule 1   Melatonin 5 MG TABS Take by mouth.     meloxicam (MOBIC) 15 MG tablet Take 1 tablet (15 mg total) by mouth daily. 30 tablet 0   predniSONE (DELTASONE) 20 MG tablet Take 2 tablets (40 mg total) by mouth daily with breakfast. 20 tablet 0   rosuvastatin (CRESTOR) 10 MG tablet TAKE 1 TABLET DAILY. PLEASE MAKE YEARLY APPOINTMENT FOR FUTURE REFILLS. THANK YOU 90 tablet 0   rosuvastatin (CRESTOR) 20 MG tablet Take 1 tablet (20 mg total) by mouth daily. 90 tablet 3   Vitamin D, Ergocalciferol, (DRISDOL) 1.25 MG (  50000 UNIT) CAPS capsule Take 1 capsule (50,000 Units total) by mouth every 7 (seven) days. 12 capsule 0   No facility-administered medications prior to visit.   Past Medical History:  Diagnosis Date   Allergy    Anxiety    Asthma    with activity Mild   Colon polyps    GERD (gastroesophageal reflux disease)    Seasonal allergies    Past Surgical History:  Procedure Laterality Date   COLONOSCOPY     UPPER GASTROINTESTINAL ENDOSCOPY     Allergies  Allergen Reactions   Trazodone And Nefazodone     Syncopal episode        Objective:    Physical Exam Vitals and nursing note reviewed.  Constitutional:      General: He is not in acute distress.    Appearance: Normal appearance.  HENT:     Head: Normocephalic.  Cardiovascular:     Rate and Rhythm: Normal rate and regular rhythm.  Pulmonary:     Effort: Pulmonary effort is normal.     Breath sounds: Normal breath sounds.  Musculoskeletal:        General: Normal range of motion.     Cervical back: Normal range of motion.  Skin:    General: Skin is warm and dry.  Neurological:     Mental Status: He is alert and oriented to person, place, and time.  Psychiatric:        Mood and Affect: Mood normal.    Pulse 78   Temp 98.3 F (36.8 C) (Temporal)   Ht 5\' 9"  (1.753 m)   Wt 187 lb 9.6 oz (85.1 kg)   SpO2 98%   BMI 27.70 kg/m  Wt Readings from Last 3 Encounters:  01/31/23 187 lb 9.6 oz (85.1 kg)  01/24/23 185 lb (83.9 kg)  12/16/22 184 lb (83.5 kg)      Dulce Sellar, NP

## 2023-01-31 NOTE — Patient Instructions (Addendum)
It was very nice to see you today!   Start taking the Famotidine (Pepcid) you have at home, 40mg , every night at bedtime for 1-2 weeks until your symptoms are better, then reduce to taking every other night, and 20mg  may be fine at that time.  Remember to reduce the acid in your diet as much as possible including caffeine, chocolate, juicy fruits (oranges, grapefruits, etc), tomatoes and tomato sauces, vinegar based condiments, etc.  Let your PCP know if symptoms persist.     PLEASE NOTE:  If you had any lab tests please let us know if you have not heard back within a few days. You may see your results on MyChart before we have a chance to review them but we will give you a call once they are reviewed by [place]. If we ordered any referrals today, please let us know if you have not heard from their office within the next week.

## 2023-02-07 ENCOUNTER — Other Ambulatory Visit: Payer: Self-pay | Admitting: Family Medicine

## 2023-03-22 NOTE — Progress Notes (Signed)
 [name] [address] [place] [address] Phone: [phone] Subjective:   [name] George [name], am serving as a scribe for Dr. Arthea Claudene.  I'm seeing this patient by the request  of:  Katrinka Garnette KIDD, MD  CC: back and neck pain follow  up   [name]  George [name] is a 58 y.o. male coming in with complaint of back and neck pain. OMT 01/24/2023. Patient states routine OMT.  Patient states no significant worsening of pain.  Has not been quite as active.  Has had some stress recently as well.  Medications patient has been prescribed: Gabapentin   Taking: gabapentin           Reviewed prior external information including notes and imaging from previsou exam, outside providers and external EMR if available.   As well as notes that were available from care everywhere and other healthcare systems.  Past medical history, social, surgical and family history all reviewed in electronic medical record.  No pertanent information unless stated regarding to the chief complaint.   Past Medical History:  Diagnosis Date   Allergy    Anxiety    Asthma    with activity Mild   Colon polyps    GERD (gastroesophageal reflux disease)    Seasonal allergies     Allergies  Allergen Reactions   Trazodone  And Nefazodone     Syncopal episode      Review of Systems:  No headache, visual changes, nausea, vomiting, diarrhea, constipation, dizziness, abdominal pain, skin rash, fevers, chills, night sweats, weight loss, swollen lymph nodes, body aches, joint swelling, chest pain, shortness of breath, mood changes. POSITIVE muscle aches  Objective  Blood pressure 120/78, pulse 71, height 5' 9 (1.753 m), SpO2 98%.   General: No apparent distress alert and oriented x3 mood and affect normal, dressed appropriately.  HEENT: Pupils equal, extraocular movements intact  Respiratory: Patient's speak in full sentences and does not appear short of breath   Cardiovascular: No lower extremity edema, non tender, no erythema  Gait MSK:  Back low back does have some tightness noted.  Tightness of the hamstring on the right side as well noted.  This seems to be more worse than usual.  Seems to have some discomfort over the popliteal area.  No pain in the calf noted.  Back does have some mild loss lordosis noted.  Osteopathic findings  C6 flexed rotated and side bent left T4 extended rotated and side bent right inhaled rib L4 flexed rotated and side bent right Sacrum right on right    Assessment and Plan:  Right knee pain Likely secondary to distal hamstring and popliteal pain.  Discussed icing regimen and home exercises, discussed which activities to do and which ones to avoid.  Will send a formal physical therapy which patient has responded to previously.  Hopefully that this will help out significantly as well.  Lumbar radiculopathy Chronic problem with mild exacerbation.  Has had some tightness noted.  Discussed icing regimen and home exercises, which activities to do and which ones to avoid.  Increase activity slowly.  I follow-up again in 6 to 8 weeks    Nonallopathic problems  Decision today to treat with OMT was based on Physical Exam  After verbal consent patient was treated with HVLA, ME, FPR techniques in cervical, rib, thoracic, lumbar, and sacral  areas  Patient tolerated the procedure well with improvement in symptoms  Patient given exercises, stretches and lifestyle modifications  See medications  in patient instructions if given  Patient will follow up in 4-8 weeks    The above documentation has been reviewed and is accurate and complete Dianah Pruett M Laveah Gloster, DO          Note: This dictation was prepared with Dragon dictation along with smaller phrase technology. Any transcriptional errors that result from this process are unintentional.

## 2023-03-24 ENCOUNTER — Ambulatory Visit: Payer: No Typology Code available for payment source | Admitting: Family Medicine

## 2023-03-24 ENCOUNTER — Encounter: Payer: Self-pay | Admitting: Family Medicine

## 2023-03-24 VITALS — BP 120/78 | HR 71 | Ht 69.0 in

## 2023-03-24 DIAGNOSIS — S76301A Unspecified injury of muscle, fascia and tendon of the posterior muscle group at thigh level, right thigh, initial encounter: Secondary | ICD-10-CM | POA: Diagnosis not present

## 2023-03-24 DIAGNOSIS — M25561 Pain in right knee: Secondary | ICD-10-CM | POA: Diagnosis not present

## 2023-03-24 DIAGNOSIS — M9902 Segmental and somatic dysfunction of thoracic region: Secondary | ICD-10-CM

## 2023-03-24 DIAGNOSIS — M9904 Segmental and somatic dysfunction of sacral region: Secondary | ICD-10-CM | POA: Diagnosis not present

## 2023-03-24 DIAGNOSIS — M9908 Segmental and somatic dysfunction of rib cage: Secondary | ICD-10-CM

## 2023-03-24 DIAGNOSIS — M9901 Segmental and somatic dysfunction of cervical region: Secondary | ICD-10-CM | POA: Diagnosis not present

## 2023-03-24 DIAGNOSIS — M5416 Radiculopathy, lumbar region: Secondary | ICD-10-CM | POA: Diagnosis not present

## 2023-03-24 DIAGNOSIS — M9903 Segmental and somatic dysfunction of lumbar region: Secondary | ICD-10-CM

## 2023-03-24 DIAGNOSIS — G8929 Other chronic pain: Secondary | ICD-10-CM

## 2023-03-24 NOTE — Assessment & Plan Note (Signed)
 Chronic problem with mild exacerbation.  Has had some tightness noted.  Discussed icing regimen and home exercises, which activities to do and which ones to avoid.  Increase activity slowly.  I follow-up again in 6 to 8 weeks

## 2023-03-24 NOTE — Assessment & Plan Note (Signed)
 Likely secondary to distal hamstring and popliteal pain.  Discussed icing regimen and home exercises, discussed which activities to do and which ones to avoid.  Will send a formal physical therapy which patient has responded to previously.  Hopefully that this will help out significantly as well.

## 2023-03-24 NOTE — Patient Instructions (Addendum)
 Good to see you  PT [address] hamstrings Stay active Follow up in 8 weeks

## 2023-04-25 ENCOUNTER — Encounter: Payer: Self-pay | Admitting: Family Medicine

## 2023-04-26 ENCOUNTER — Other Ambulatory Visit: Payer: Self-pay

## 2023-04-26 MED ORDER — GABAPENTIN 100 MG PO CAPS: 200.0000 mg | ORAL_CAPSULE | Freq: Every day | ORAL | 1 refills | Status: DC

## 2023-04-27 ENCOUNTER — Other Ambulatory Visit: Payer: Self-pay

## 2023-04-27 MED ORDER — GABAPENTIN 100 MG PO CAPS: 200.0000 mg | ORAL_CAPSULE | Freq: Every day | ORAL | 1 refills | Status: DC

## 2023-05-04 ENCOUNTER — Other Ambulatory Visit: Payer: Self-pay | Admitting: Interventional Cardiology

## 2023-05-18 NOTE — Progress Notes (Unsigned)
 [name] [address] [place] [address] Phone: [phone] Subjective:    I'm seeing this patient by the request  of:  Shelva Majestic, MD  CC: Back and neck pain follow-up  [id number]  George [name] is a 58 y.o. male coming in with complaint of back and neck pain. OMT 03/24/2023. Also f/u for R knee pain. Patient states he is okay. Here for a check in . Back/hip feel under control. Right thumb pain and thinks he has a right plantar wart that he wants looked at    Medications patient has been prescribed: Gabapentin  Taking:         Reviewed prior external information including notes and imaging from previsou exam, outside providers and external EMR if available.   As well as notes that were available from care everywhere and other healthcare systems.  Past medical history, social, surgical and family history all reviewed in electronic medical record.  No pertanent information unless stated regarding to the chief complaint.   Past Medical History:  Diagnosis Date   Allergy    Anxiety    Asthma    with activity Mild   Colon polyps    GERD (gastroesophageal reflux disease)    Seasonal allergies     Allergies  Allergen Reactions   Trazodone And Nefazodone     Syncopal episode      Review of Systems:  No headache, visual changes, nausea, vomiting, diarrhea, constipation, dizziness, abdominal pain, skin rash, fevers, chills, night sweats, weight loss, swollen lymph nodes, body aches, joint swelling, chest pain, shortness of breath, mood changes. POSITIVE muscle aches  Objective  Blood pressure 110/82, pulse 69, height 5\' 9"  (1.753 m), weight 186 lb (84.4 kg), SpO2 100%.   General: No apparent distress alert and oriented x3 mood and affect normal, dressed appropriately.  HEENT: Pupils equal, extraocular movements intact  Respiratory: Patient's speak in full sentences and does not appear short of breath  Cardiovascular: No  lower extremity edema, non tender, no erythema  MSK:  Back tightness noted.  Patient does have tightness noted of the neck as well.  Negative straight leg test noted.  Negative Spurling's of the neck.  Seems to be mostly muscular.  Some in the tightness of the parascapular area and seems to be bilateral  Osteopathic findings  C2 flexed rotated and side bent right C7 flexed rotated and side bent left T3 extended rotated and side bent right inhaled rib T5 extended rotated and side bent left inhaled rib L2 flexed rotated and side bent right Sacrum right on right    Assessment and Plan:  Callus of foot Does have a plantar wart also noted.  We discussed different home modalities.  Worsening symptoms will consider liquid nitrogen at follow-up.  Trigger thumb of left hand Had trigger thumb on the contralateral side and will need to monitor.  Hopefully will do well with changes in his stylus and other treatment options.  Poor posture Increased tightness in the thoracic spine and given more of a rotational component.  Follow-up again in 6 to 8 weeks    Nonallopathic problems  Decision today to treat with OMT was based on Physical Exam  After verbal consent patient was treated with HVLA, ME, FPR techniques in cervical, rib, thoracic, lumbar, and sacral  areas  Patient tolerated the procedure well with improvement in symptoms  Patient given exercises, stretches and lifestyle modifications  See medications in patient instructions if given  Patient  will follow up in 4-8 weeks    The above documentation has been reviewed and is accurate and complete Judi Saa, DO          Note: This dictation was prepared with Dragon dictation along with smaller phrase technology. Any transcriptional errors that result from this process are unintentional.

## 2023-05-19 ENCOUNTER — Ambulatory Visit: Payer: No Typology Code available for payment source | Admitting: Family Medicine

## 2023-05-19 ENCOUNTER — Encounter: Payer: Self-pay | Admitting: Family Medicine

## 2023-05-19 VITALS — BP 110/82 | HR 69 | Ht 69.0 in | Wt 186.0 lb

## 2023-05-19 DIAGNOSIS — M9908 Segmental and somatic dysfunction of rib cage: Secondary | ICD-10-CM

## 2023-05-19 DIAGNOSIS — M65312 Trigger thumb, left thumb: Secondary | ICD-10-CM | POA: Diagnosis not present

## 2023-05-19 DIAGNOSIS — M9904 Segmental and somatic dysfunction of sacral region: Secondary | ICD-10-CM

## 2023-05-19 DIAGNOSIS — L84 Corns and callosities: Secondary | ICD-10-CM

## 2023-05-19 DIAGNOSIS — M9903 Segmental and somatic dysfunction of lumbar region: Secondary | ICD-10-CM

## 2023-05-19 DIAGNOSIS — M9902 Segmental and somatic dysfunction of thoracic region: Secondary | ICD-10-CM | POA: Diagnosis not present

## 2023-05-19 DIAGNOSIS — R293 Abnormal posture: Secondary | ICD-10-CM | POA: Diagnosis not present

## 2023-05-19 DIAGNOSIS — M9901 Segmental and somatic dysfunction of cervical region: Secondary | ICD-10-CM

## 2023-05-19 NOTE — Assessment & Plan Note (Signed)
 Increased tightness in the thoracic spine and given more of a rotational component.  Follow-up again in 6 to 8 weeks

## 2023-05-19 NOTE — Patient Instructions (Addendum)
 Dr Jabier Mutton wart remover cream daily for week  covered with band aid Ice cube for 2 minutes usually does okay  Great to see you You have a great kid See me in 6-8 weeks

## 2023-05-19 NOTE — Assessment & Plan Note (Signed)
 Had trigger thumb on the contralateral side and will need to monitor.  Hopefully will do well with changes in his stylus and other treatment options.

## 2023-05-19 NOTE — Assessment & Plan Note (Signed)
 Does have a plantar wart also noted.  We discussed different home modalities.  Worsening symptoms will consider liquid nitrogen at follow-up.

## 2023-05-20 ENCOUNTER — Encounter: Payer: Self-pay | Admitting: Cardiology

## 2023-05-20 ENCOUNTER — Ambulatory Visit: Payer: No Typology Code available for payment source | Attending: Cardiology | Admitting: Cardiology

## 2023-05-20 VITALS — BP 114/82 | HR 65 | Resp 16 | Ht 69.0 in | Wt 185.6 lb

## 2023-05-20 DIAGNOSIS — E7800 Pure hypercholesterolemia, unspecified: Secondary | ICD-10-CM

## 2023-05-20 DIAGNOSIS — I700 Atherosclerosis of aorta: Secondary | ICD-10-CM | POA: Diagnosis not present

## 2023-05-20 DIAGNOSIS — R931 Abnormal findings on diagnostic imaging of heart and coronary circulation: Secondary | ICD-10-CM | POA: Diagnosis not present

## 2023-05-20 MED ORDER — EZETIMIBE 10 MG PO TABS
10.0000 mg | ORAL_TABLET | Freq: Every day | ORAL | 1 refills | Status: DC
Start: 2023-05-20 — End: 2023-06-13

## 2023-05-20 NOTE — Progress Notes (Signed)
 + Cardiology Office Note:  .   Date:  05/20/2023  ID:  George [name], DOB 22-Aug-1965, MRN [id number] PCP: Shelva Majestic, MD  [facility] Providers Cardiologist:  Lance Muss, MD Electrophysiologist:  Yates Decamp, MD   History of Present Illness: .   George [name] is a 58 y.o. Patient with elevated coronary calcium score in the 85th percentile, aortic atherosclerosis, hypercholesterolemia presents for annual visit.  He remains asymptomatic, continues to exercise regularly.   Labs   Lab Results  Component Value Date   CHOL 166 08/12/2022   HDL 55 08/12/2022   LDLCALC 93 08/12/2022   LDLDIRECT 159.3 06/25/2011   TRIG 99 08/12/2022   CHOLHDL 3.0 08/12/2022   Lab Results  Component Value Date   NA 139 05/03/2022   K 4.3 05/03/2022   CO2 29 05/03/2022   GLUCOSE 83 05/03/2022   BUN 29 (H) 05/03/2022   CREATININE 1.21 05/03/2022   CALCIUM 9.5 05/03/2022   GFR 67.16 05/03/2022   GFRNONAA 68 11/27/2019      Latest Ref Rng & Units 05/03/2022    8:43 AM 08/19/2021    9:16 AM 11/27/2019    2:37 PM  BMP  Glucose 70 - 99 mg/dL 83  89  82   BUN 6 - 23 mg/dL 29  23  22    Creatinine 0.40 - 1.50 mg/dL 6.21  3.08  6.57   BUN/Creat Ratio 6 - 22 (calc)   NOT APPLICABLE   Sodium 135 - 145 mEq/L 139  139  138   Potassium 3.5 - 5.1 mEq/L 4.3  4.1  5.0   Chloride 96 - 112 mEq/L 103  102  102   CO2 19 - 32 mEq/L 29  29  27    Calcium 8.4 - 10.5 mg/dL 9.5  84.6  9.5       Latest Ref Rng & Units 05/03/2022    8:43 AM 08/19/2021    9:16 AM 11/27/2019    2:37 PM  CBC  WBC 4.0 - 10.5 K/uL 7.1  7.4  9.7   Hemoglobin 13.0 - 17.0 g/dL 96.2  95.2  84.1   Hematocrit 39.0 - 52.0 % 44.1  45.2  43.0   Platelets 150.0 - 400.0 K/uL 243.0  246.0  225    No results found for: "HGBA1C"  Lab Results  Component Value Date   TSH 3.35 08/19/2021    External Labs:  Review of Systems  Cardiovascular:  Negative for chest pain, dyspnea on exertion and leg swelling.   Physical Exam:   VS:  BP  114/82 (BP Location: Left Arm, Patient Position: Sitting, Cuff Size: Normal)   Pulse 65   Resp 16   Ht 5\' 9"  (1.753 m)   Wt 185 lb 9.6 oz (84.2 kg)   SpO2 97%   BMI 27.41 kg/m    Wt Readings from Last 3 Encounters:  05/20/23 185 lb 9.6 oz (84.2 kg)  05/19/23 186 lb (84.4 kg)  01/31/23 187 lb 9.6 oz (85.1 kg)    Physical Exam Neck:     Vascular: No carotid bruit or JVD.  Cardiovascular:     Rate and Rhythm: Normal rate and regular rhythm.     Pulses: Intact distal pulses.     Heart sounds: Normal heart sounds. No murmur heard.    No gallop.  Pulmonary:     Effort: Pulmonary effort is normal.     Breath sounds: Normal breath sounds.  Abdominal:     General: Bowel  sounds are normal.     Palpations: Abdomen is soft.  Musculoskeletal:     Right lower leg: No edema.     Left lower leg: No edema.    Studies Reviewed: Marland Kitchen     EKG:    EKG Interpretation Date/Time:  Friday May 20 2023 08:03:46 EST Ventricular Rate:  63 PR Interval:  168 QRS Duration:  80 QT Interval:  412 QTC Calculation: 421 R Axis:   -9  Text Interpretation: EKG 05/20/2023: Normal sinus rhythm at rate of 63 bpm, normal EKG. Confirmed by Delrae Rend [id number]) on 05/20/2023 8:19:40 AM    Medications and allergies    Allergies  Allergen Reactions   Trazodone And Nefazodone     Syncopal episode      Current Outpatient Medications:    albuterol (VENTOLIN HFA) 108 (90 Base) MCG/ACT inhaler, Inhale 2 puffs into the lungs every 6 (six) hours as needed for wheezing or shortness of breath., Disp: 1 each, Rfl: 2   budesonide-formoterol (SYMBICORT) 160-4.5 MCG/ACT inhaler, INHALE 2 PUFFS INTO THE LUNGS TWICE A DAY, Disp: 30.6 each, Rfl: 3   buPROPion (WELLBUTRIN XL) 150 MG 24 hr tablet, TAKE 1 TABLET BY MOUTH EVERY MORNING AFTER BREAKFAST Oral for 30 Days, Disp: , Rfl:    cetirizine (ZYRTEC) 10 MG tablet, Take 10 mg by mouth at bedtime., Disp: , Rfl:    cyanocobalamin (VITAMIN B12) 500 MCG tablet, Take 1,000  mcg by mouth daily., Disp: , Rfl:    DEXILANT 60 MG capsule, TAKE ONE CAPSULE BY MOUTH DAILY, Disp: 90 capsule, Rfl: 2   escitalopram (LEXAPRO) 20 MG tablet, TAKE 1 TABLET BY MOUTH EVERY DAY, Disp: 90 tablet, Rfl: 3   ezetimibe (ZETIA) 10 MG tablet, Take 1 tablet (10 mg total) by mouth daily., Disp: 30 tablet, Rfl: 1   fluticasone (FLONASE) 50 MCG/ACT nasal spray, Place 2 sprays into both nostrils daily., Disp: 16 g, Rfl: 11   gabapentin (NEURONTIN) 100 MG capsule, Take 2 capsules (200 mg total) by mouth at bedtime., Disp: 180 capsule, Rfl: 1   Melatonin 5 MG TABS, Take by mouth., Disp: , Rfl:    rosuvastatin (CRESTOR) 20 MG tablet, TAKE 1 TABLET BY MOUTH EVERY DAY, Disp: 90 tablet, Rfl: 0   ASSESSMENT AND PLAN: .      ICD-10-CM   1. Aortic atherosclerosis (HCC)  I70.0 EKG 12-Lead    ezetimibe (ZETIA) 10 MG tablet    2. Elevated coronary artery calcium score 2022: Total score 97.7 in the 85th percentle MESA database  R93.1 ezetimibe (ZETIA) 10 MG tablet    3. Pure hypercholesterolemia  E78.00 ezetimibe (ZETIA) 10 MG tablet      1. Aortic atherosclerosis (HCC) Patient with aortic atherosclerosis and elevated coronary calcium score in the 85th percentile, needs primary prevention only.  He remains asymptomatic and continues to exercise regularly.  I reviewed his lipids, LDL <100 but he is only 58 years of age and preferred to have it closer to 28.  Will add Zetia 10 mg daily.  I will request his PCP to take over his care and see me back on a as needed basis.  2. Elevated coronary artery calcium score 2022: Total score 97.7 in the 85th percentle MESA database As dictated above  3.  Pure hypercholesterolemia Clearly addition of Zetia will bring his LDL down to 70 or less.  As patient is willing to start the medication, Zetia 10 mg have been sent, will request PCP to take over.  Signed,  Yates Decamp, MD, [facility] 05/20/2023, 8:31 AM [facility] [address] [place], [place]  [address] Phone: [phone]. Fax:  [phone]

## 2023-05-20 NOTE — Patient Instructions (Signed)
 Medication Instructions:  Your physician recommends that you continue on your current medications as directed. Please refer to the Current Medication list given to you today. *If you need a refill on your cardiac medications before your next appointment, please call your pharmacy*   Follow-Up: At [facility], you and your health needs are our priority.  As part of our continuing mission to provide you with exceptional heart care, we have created designated Provider Care Teams.  These Care Teams include your primary Cardiologist (physician) and Advanced Practice Providers (APPs -  Physician Assistants and Nurse Practitioners) who all work together to provide you with the care you need, when you need it.  We recommend signing up for the patient portal called "MyChart".  Sign up information is provided on this After Visit Summary.  MyChart is used to connect with patients for Virtual Visits (Telemedicine).  Patients are able to view lab/test results, encounter notes, upcoming appointments, etc.  Non-urgent messages can be sent to your provider as well.   To learn more about what you can do with MyChart, go to [email or web address].    Your next appointment:   As Needed  Provider:   Dr Jacinto Halim

## 2023-06-13 ENCOUNTER — Other Ambulatory Visit: Payer: Self-pay | Admitting: Cardiology

## 2023-06-13 DIAGNOSIS — I700 Atherosclerosis of aorta: Secondary | ICD-10-CM

## 2023-06-13 DIAGNOSIS — E7800 Pure hypercholesterolemia, unspecified: Secondary | ICD-10-CM

## 2023-06-13 DIAGNOSIS — R931 Abnormal findings on diagnostic imaging of heart and coronary circulation: Secondary | ICD-10-CM

## 2023-06-19 ENCOUNTER — Encounter: Payer: Self-pay | Admitting: Family Medicine

## 2023-06-22 ENCOUNTER — Other Ambulatory Visit: Payer: Self-pay

## 2023-06-22 DIAGNOSIS — I700 Atherosclerosis of aorta: Secondary | ICD-10-CM

## 2023-06-22 DIAGNOSIS — E7800 Pure hypercholesterolemia, unspecified: Secondary | ICD-10-CM

## 2023-06-22 DIAGNOSIS — R931 Abnormal findings on diagnostic imaging of heart and coronary circulation: Secondary | ICD-10-CM

## 2023-06-22 MED ORDER — EZETIMIBE 10 MG PO TABS: 10.0000 mg | ORAL_TABLET | Freq: Every day | ORAL | 3 refills | Status: AC

## 2023-07-06 NOTE — Progress Notes (Unsigned)
 [facility] [address] [place] [address] Phone: [phone] Subjective:   George [name], am serving as a scribe for Dr. Ronnell Coins.  I'[name] seeing this patient by the request  of:  Almira Jaeger, MD  CC: Back and neck pain follow-up, left thumb follow-up  [id number]  George [name] is a 58 y.o. male coming in with complaint of back and neck pain. OMT 05/19/2023. Also f/u for L thumb pain. Patient states that he is doing well. Little soreness but nothing really bothering him.   Medications patient has been prescribed: Gabapentin   Taking:         Reviewed prior external information including notes and imaging from previsou exam, outside providers and external EMR if available.   As well as notes that were available from care everywhere and other healthcare systems.  Past medical history, social, surgical and family history all reviewed in electronic medical record.  No pertanent information unless stated regarding to the chief complaint.   Past Medical History:  Diagnosis Date   Allergy    Anxiety    Asthma    with activity Mild   Colon polyps    GERD (gastroesophageal reflux disease)    Seasonal allergies     Allergies  Allergen Reactions   Trazodone  And Nefazodone     Syncopal episode      Review of Systems:  No headache, visual changes, nausea, vomiting, diarrhea, constipation, dizziness, abdominal pain, skin rash, fevers, chills, night sweats, weight loss, swollen lymph nodes, body aches, joint swelling, chest pain, shortness of breath, mood changes. POSITIVE muscle aches  Objective  Blood pressure 110/72, pulse 72, height 5\' 9"  (1.753 [name]), weight 183 lb (83 kg), SpO2 98%.   General: No apparent distress alert and oriented x3 mood and affect normal, dressed appropriately.  HEENT: Pupils equal, extraocular movements intact  Respiratory: Patient's speak in full sentences and does not appear short of breath   Cardiovascular: No lower extremity edema, non tender, no erythema  Gait MSK:  Back does have some loss lordosis and some tenderness to palpation noted.  Osteopathic findings  C2 flexed rotated and side bent right C6 flexed rotated and side bent left T3 extended rotated and side bent right inhaled rib T9 extended rotated and side bent left L2 flexed rotated and side bent right Sacrum right on right    Assessment and Plan:  Lumbar radiculopathy Patient likely is not having any true radicular symptoms at this time.  Explained to continue to be active.  Discussed icing regimen and home exercises.  Discussed posture and ergonomics.  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  Follow-up again in 6 to 8 weeks    Nonallopathic problems  Decision today to treat with OMT was based on Physical Exam  After verbal consent patient was treated with HVLA, ME, FPR techniques in cervical, rib, thoracic, lumbar, and sacral  areas  Patient tolerated the procedure well with improvement in symptoms  Patient given exercises, stretches and lifestyle modifications  See medications in patient instructions if given  Patient will follow up in 4-8 weeks     The above documentation has been reviewed and is accurate and complete George [name] George Lague, DO         Note: This dictation was prepared with Dragon dictation along with smaller phrase technology. Any transcriptional errors that result from this process are unintentional.

## 2023-07-07 ENCOUNTER — Ambulatory Visit: Admitting: Family Medicine

## 2023-07-07 ENCOUNTER — Encounter: Payer: Self-pay | Admitting: Family Medicine

## 2023-07-07 ENCOUNTER — Other Ambulatory Visit: Payer: Self-pay

## 2023-07-07 VITALS — BP 110/72 | HR 72 | Ht 69.0 in | Wt 183.0 lb

## 2023-07-07 DIAGNOSIS — M5416 Radiculopathy, lumbar region: Secondary | ICD-10-CM

## 2023-07-07 DIAGNOSIS — M9904 Segmental and somatic dysfunction of sacral region: Secondary | ICD-10-CM

## 2023-07-07 DIAGNOSIS — M9901 Segmental and somatic dysfunction of cervical region: Secondary | ICD-10-CM | POA: Diagnosis not present

## 2023-07-07 DIAGNOSIS — M9908 Segmental and somatic dysfunction of rib cage: Secondary | ICD-10-CM

## 2023-07-07 DIAGNOSIS — M79645 Pain in left finger(s): Secondary | ICD-10-CM

## 2023-07-07 DIAGNOSIS — M9902 Segmental and somatic dysfunction of thoracic region: Secondary | ICD-10-CM | POA: Diagnosis not present

## 2023-07-07 DIAGNOSIS — M9903 Segmental and somatic dysfunction of lumbar region: Secondary | ICD-10-CM

## 2023-07-07 DIAGNOSIS — G8929 Other chronic pain: Secondary | ICD-10-CM

## 2023-07-07 NOTE — Assessment & Plan Note (Signed)
 Patient likely is not having any true radicular symptoms at this time.  Explained to continue to be active.  Discussed icing regimen and home exercises.  Discussed posture and ergonomics.  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  Follow-up again in 6 to 8 weeks

## 2023-07-07 NOTE — Patient Instructions (Signed)
 See me in 6-8 weeks

## 2023-08-02 ENCOUNTER — Ambulatory Visit (INDEPENDENT_AMBULATORY_CARE_PROVIDER_SITE_OTHER): Payer: No Typology Code available for payment source | Admitting: Family Medicine

## 2023-08-02 ENCOUNTER — Ambulatory Visit: Payer: Self-pay | Admitting: Family Medicine

## 2023-08-02 ENCOUNTER — Encounter: Payer: Self-pay | Admitting: Family Medicine

## 2023-08-02 VITALS — BP 120/82 | HR 86 | Temp 97.2°F | Ht 69.0 in | Wt 183.0 lb

## 2023-08-02 DIAGNOSIS — E663 Overweight: Secondary | ICD-10-CM

## 2023-08-02 DIAGNOSIS — E785 Hyperlipidemia, unspecified: Secondary | ICD-10-CM | POA: Diagnosis not present

## 2023-08-02 DIAGNOSIS — R0683 Snoring: Secondary | ICD-10-CM

## 2023-08-02 DIAGNOSIS — Z0000 Encounter for general adult medical examination without abnormal findings: Secondary | ICD-10-CM | POA: Diagnosis not present

## 2023-08-02 DIAGNOSIS — Z23 Encounter for immunization: Secondary | ICD-10-CM | POA: Diagnosis not present

## 2023-08-02 DIAGNOSIS — Z131 Encounter for screening for diabetes mellitus: Secondary | ICD-10-CM | POA: Diagnosis not present

## 2023-08-02 DIAGNOSIS — E538 Deficiency of other specified B group vitamins: Secondary | ICD-10-CM

## 2023-08-02 DIAGNOSIS — E559 Vitamin D deficiency, unspecified: Secondary | ICD-10-CM

## 2023-08-02 DIAGNOSIS — Z125 Encounter for screening for malignant neoplasm of prostate: Secondary | ICD-10-CM | POA: Diagnosis not present

## 2023-08-02 LAB — CBC WITH DIFFERENTIAL/PLATELET
Basophils Absolute: 0 10*3/uL (ref 0.0–0.1)
Basophils Relative: 0.7 % (ref 0.0–3.0)
Eosinophils Absolute: 0.2 10*3/uL (ref 0.0–0.7)
Eosinophils Relative: 2.5 % (ref 0.0–5.0)
HCT: 44.6 % (ref 39.0–52.0)
Hemoglobin: 15.2 g/dL (ref 13.0–17.0)
Lymphocytes Relative: 26.5 % (ref 12.0–46.0)
Lymphs Abs: 1.7 10*3/uL (ref 0.7–4.0)
MCHC: 34 g/dL (ref 30.0–36.0)
MCV: 90.9 fl (ref 78.0–100.0)
Monocytes Absolute: 0.6 10*3/uL (ref 0.1–1.0)
Monocytes Relative: 9.6 % (ref 3.0–12.0)
Neutro Abs: 4 10*3/uL (ref 1.4–7.7)
Neutrophils Relative %: 60.7 % (ref 43.0–77.0)
Platelets: 229 10*3/uL (ref 150.0–400.0)
RBC: 4.91 Mil/uL (ref 4.22–5.81)
RDW: 12.8 % (ref 11.5–15.5)
WBC: 6.5 10*3/uL (ref 4.0–10.5)

## 2023-08-02 LAB — COMPREHENSIVE METABOLIC PANEL WITH GFR
ALT: 28 U/L (ref 0–53)
AST: 27 U/L (ref 0–37)
Albumin: 4.6 g/dL (ref 3.5–5.2)
Alkaline Phosphatase: 72 U/L (ref 39–117)
BUN: 24 mg/dL — ABNORMAL HIGH (ref 6–23)
CO2: 28 meq/L (ref 19–32)
Calcium: 9.6 mg/dL (ref 8.4–10.5)
Chloride: 103 meq/L (ref 96–112)
Creatinine, Ser: 1.12 mg/dL (ref 0.40–1.50)
GFR: 73.04 mL/min (ref >60.00)
Glucose, Bld: 90 mg/dL (ref 70–99)
Potassium: 4.7 meq/L (ref 3.5–5.1)
Sodium: 141 meq/L (ref 135–145)
Total Bilirubin: 0.6 mg/dL (ref 0.2–1.2)
Total Protein: 7.2 g/dL (ref 6.0–8.3)

## 2023-08-02 LAB — LIPID PANEL
Cholesterol: 142 mg/dL (ref 0–200)
HDL: 54.4 mg/dL (ref >39.00)
LDL Cholesterol: 65 mg/dL (ref 0–99)
NonHDL: 88.01
Total CHOL/HDL Ratio: 3
Triglycerides: 116 mg/dL (ref 0.0–149.0)
VLDL: 23.2 mg/dL (ref 0.0–40.0)

## 2023-08-02 LAB — HEMOGLOBIN A1C: Hgb A1c MFr Bld: 5.5 % (ref 4.6–6.5)

## 2023-08-02 LAB — VITAMIN B12: Vitamin B-12: 538 pg/mL (ref 211–911)

## 2023-08-02 LAB — PSA: PSA: 2.02 ng/mL (ref 0.10–4.00)

## 2023-08-02 LAB — VITAMIN D 25 HYDROXY (VIT D DEFICIENCY, FRACTURES): VITD: 25.27 ng/mL — ABNORMAL LOW (ref 30.00–100.00)

## 2023-08-02 MED ORDER — GABAPENTIN 100 MG PO CAPS: 200.0000 mg | ORAL_CAPSULE | Freq: Every day | ORAL | 1 refills | Status: AC

## 2023-08-02 MED ORDER — VITAMIN D (ERGOCALCIFEROL) 1.25 MG (50000 UNIT) PO CAPS
50000.0000 [IU] | ORAL_CAPSULE | ORAL | 1 refills | Status: DC
Start: 2023-08-02 — End: 2024-01-13

## 2023-08-02 NOTE — Patient Instructions (Addendum)
 Please stop by lab before you go If you have mychart- we will send your results within 3 business days of us  receiving them.  If you do not have mychart- we will call you about results within 5 business days of us  receiving them.  *please also note that you will see labs on mychart as soon as they post. I will later go in and write notes on them- will say "notes from Dr. Arlene Ben"   We have placed a referral for you today to pulmonary- please call their # if you do not hear within a week (may be listed below or you may see mychart message within a few days with #).   Refilled gabapentin - glad this is helpful  Recommended follow up: Return in about 1 year (around 08/01/2024) for physical or sooner if needed.Schedule b4 you leave.

## 2023-08-02 NOTE — Progress Notes (Signed)
 Phone: [phone]   Subjective:  Patient presents today for their annual physical. Chief complaint-noted.   See problem oriented charting- ROS- full  review of systems was completed and negative  Per full ROS sheet completed by patient except for topics noted under acute/chronic concerns- plus mild stomach sensitivity  The following were reviewed and entered/updated in epic: Past Medical History:  Diagnosis Date   Allergy    Anxiety    Asthma    with activity Mild   Colon polyps    GERD (gastroesophageal reflux disease)    Seasonal allergies    Patient Active Problem List   Diagnosis Date Noted   Aortic atherosclerosis (HCC) 04/10/2021    Priority: Medium    Mild sleep apnea 05/27/2020    Priority: Medium    GAD (generalized anxiety disorder) 03/17/2020    Priority: Medium    Major depressive disorder with single episode, in full remission (HCC) 03/17/2020    Priority: Medium    Hyperlipidemia, unspecified 07/03/2019    Priority: Medium    Vitamin D  deficiency 03/21/2018    Priority: Medium    B12 deficiency 03/21/2018    Priority: Medium    Allergic rhinitis due to pollen 04/23/2015    Priority: Medium    Extrinsic asthma 08/11/2010    Priority: Medium    Insomnia 08/11/2010    Priority: Medium    GERD 12/08/2007    Priority: Medium    Irritable bowel syndrome 12/08/2007    Priority: Medium    Rosacea 05/03/2022    Priority: Low   Seborrheic keratoses 05/03/2022    Priority: Low   Trigger thumb of left hand 12/16/2022    Priority: 1.   Calcific tendinitis of lower leg, right 02/17/2022    Priority: 1.   Greater trochanteric bursitis of right hip 07/08/2021    Priority: 1.   Whiplash 12/09/2020    Priority: 1.   Foot contusion 12/09/2020    Priority: 1.   Right knee pain 06/04/2020    Priority: 1.   Hamstring tightness of right lower extremity 11/02/2019    Priority: 1.   Callus of foot 05/17/2019    Priority: 1.   Poor posture 12/22/2017     Priority: 1.   Capsulitis of metatarsophalangeal (MTP) joint of left foot 12/22/2017    Priority: 1.   Nonallopathic lesion of cervical region 10/28/2017    Priority: 1.   Nonallopathic lesion of rib cage 10/28/2017    Priority: 1.   Tibialis posterior tendinitis, right 08/30/2017    Priority: 1.   Extensor intersection syndrome of right wrist 02/24/2017    Priority: 1.   ECU (extensor carpi ulnaris), subluxation/dislocation, right, initial encounter 07/19/2016    Priority: 1.   Lumbar radiculopathy 08/14/2015    Priority: 1.   Nonallopathic lesion of thoracic region 05/19/2015    Priority: 1.   Nonallopathic lesion of lumbosacral region 05/19/2015    Priority: 1.   Nonallopathic lesion of sacral region 05/19/2015    Priority: 1.   Piriformis syndrome of right side 04/24/2015    Priority: 1.   Past Surgical History:  Procedure Laterality Date   COLONOSCOPY     UPPER GASTROINTESTINAL ENDOSCOPY      Family History  Problem Relation Age of Onset   Colon polyps Mother    Rectal cancer Father    Obesity Father    Asthma Father    Hypertension Father    Bladder Cancer Father    CAD Father  prior smoker. cabg in late 85s   Healthy Sister    Bipolar disorder Brother    Colon cancer Maternal Grandmother    Diabetes Neg Hx    Early death Neg Hx    Heart disease Neg Hx    Hyperlipidemia Neg Hx    Kidney disease Neg Hx    Stroke Neg Hx    Esophageal cancer Neg Hx    Stomach cancer Neg Hx     Medications- reviewed and updated Current Outpatient Medications  Medication Sig Dispense Refill   albuterol  (VENTOLIN  HFA) 108 (90 Base) MCG/ACT inhaler Inhale 2 puffs into the lungs every 6 (six) hours as needed for wheezing or shortness of breath. 1 each 2   budesonide -formoterol  (SYMBICORT ) 160-4.5 MCG/ACT inhaler INHALE 2 PUFFS INTO THE LUNGS TWICE A DAY 30.6 each 3   buPROPion (WELLBUTRIN XL) 150 MG 24 hr tablet TAKE 1 TABLET BY MOUTH EVERY MORNING AFTER BREAKFAST Oral  for 30 Days     cetirizine (ZYRTEC) 10 MG tablet Take 10 mg by mouth at bedtime.     cyanocobalamin  (VITAMIN B12) 500 MCG tablet Take 1,000 mcg by mouth daily.     DEXILANT  60 MG capsule TAKE ONE CAPSULE BY MOUTH DAILY 90 capsule 2   escitalopram  (LEXAPRO ) 20 MG tablet TAKE 1 TABLET BY MOUTH EVERY DAY 90 tablet 3   ezetimibe  (ZETIA ) 10 MG tablet Take 1 tablet (10 mg total) by mouth daily. 90 tablet 3   fluticasone  (FLONASE ) 50 MCG/ACT nasal spray Place 2 sprays into both nostrils daily. 16 g 11   gabapentin  (NEURONTIN ) 100 MG capsule Take 2 capsules (200 mg total) by mouth at bedtime. 180 capsule 1   lamoTRIgine (LAMICTAL) 25 MG tablet Take 25 mg by mouth 2 (two) times daily.     Melatonin 5 MG TABS Take by mouth.     rosuvastatin  (CRESTOR ) 20 MG tablet TAKE 1 TABLET BY MOUTH EVERY DAY 90 tablet 0   No current facility-administered medications for this visit.    Allergies-reviewed and updated Allergies  Allergen Reactions   Trazodone  And Nefazodone     Syncopal episode     Social History   Social History Narrative   Married. 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       [place]: tae kwon do, exercise, movies, hiking   Objective  Objective:  BP 120/82   Pulse 86   Temp (!) 97.2 F (36.2 C)   Ht 5\' 9"  (1.753 m)   Wt 183 lb (83 kg)   SpO2 96%   BMI 27.02 kg/m  Gen: NAD, resting comfortably HEENT: Mucous membranes are moist. Oropharynx normal Neck: no thyromegaly CV: RRR no murmurs rubs or gallops Lungs: CTAB no crackles, wheeze, rhonchi Abdomen: soft/nontender/nondistended/normal bowel sounds. No rebound or guarding.  Ext: no edema Skin: warm, dry Neuro: grossly normal, moves all extremities, PERRLA Declines rectal or genitourinary exam    Assessment and Plan  58 y.o. male presenting for annual physical.  Health Maintenance counseling: 1. Anticipatory guidance: Patient counseled  regarding regular dental exams -q6 months, eye exams -yearly,  avoiding smoking and second hand smoke, limiting alcohol to 2 beverages per day - maybe 1 a week, no illicit drugs .   2. Risk factor reduction:  Advised patient of need for regular exercise and diet rich and fruits and vegetables to reduce risk of heart attack and  stroke.  Exercise- 6 days a week Bryan. Doesn't love weights- more reps light weight Diet/weight management-feels could maximize diet further but challenging with work stress and having 87 year old son. Feels sweets/sugars big weakness and trying to cut down- trying more for special occasions- has nutrionist available he may chat with  Wt Readings from Last 3 Encounters:  08/02/23 183 lb (83 kg)  07/07/23 183 lb (83 kg)  05/20/23 185 lb 9.6 oz (84.2 kg)  3. Immunizations/screenings/ancillary studies- Prevnar 20 today, COVID had in the fall with flu shot Immunization History  Administered Date(s) Administered   Influenza Split 01/27/2012, 12/13/2012, 12/13/2013   Influenza Whole 12/08/2007   Influenza,inj,Quad PF,6+ Mos 12/05/2014, 12/22/2017, 12/08/2018, 11/27/2019, 12/26/2020   Influenza-Unspecified 12/02/2021   PFIZER(Purple Top)SARS-COV-2 Vaccination 05/19/2019, 06/09/2019, 12/28/2019   PNEUMOCOCCAL CONJUGATE-20 08/02/2023   Pfizer Covid-19 Vaccine Bivalent Booster 48yrs & up 12/26/2020   Pneumococcal Polysaccharide-23 06/25/2011   Tdap 06/25/2011, 05/03/2022   Unspecified SARS-COV-2 Vaccination 12/02/2021   Zoster Recombinant(Shingrix ) 02/22/2022, 01/31/2023  4. Prostate cancer screening- low risk overall prior trend- update psa today  Lab Results  Component Value Date   PSA 1.20 05/03/2022   PSA 0.60 12/05/2007   5. Colon cancer screening - 05/20/22 with 10 year repeat per Dr.. Bridgett Camps 6. Skin cancer screening- sees derm yearly  in general but hard to get in. advised regular sunscreen use. Denies worrisome, changing, or new skin lesions.  7. Smoking associated  screening (lung cancer screening, AAA screen 65-75, UA)- never smoker  8. STD screening - monogamous   Status of chronic or acute concerns   #social update- father in law died recently and hard on wife.- had lewy body dementia for 2.5 years. Work still stressful. 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  -college reunion next week and [place] town visit for work- excited about it though  #Snoring- has seen ENT and had sleep study a few years ago and was told didn't tneed CPAP at that time but wife has noted worsening snoring.  -refer to pulmonary -Sometimes wakes up with headache(s) once every 2 weeks- could be sleep apnea related and can be hard to shake- Excedrin helps  #hyperlipidemia-CT cardiac scoring 97.7 on 12/25/2020 81st percentile for age  #Aortic atherosclerosis S: Medication:Rosuvastatin  10 mg daily, Zetia  10 mg daily -last seen Dr. Berry Bristol 05/20/23 but released back to primary care Lab Results  Component Value Date   CHOL 166 08/12/2022   HDL 55 08/12/2022   LDLCALC 93 08/12/2022   LDLDIRECT 159.3 06/25/2011   TRIG 99 08/12/2022   CHOLHDL 3.0 08/12/2022   A/P: lipids hopefully improved on zetia - update today- Dr. Berry Bristol prefers LDL under 70 if possible  # Asthma/allergies S: Maintenance Medication: Symbicort  160-4.5 mcg 2 puffs twice daily -consistent lately, Flonase  50 mg, Zyrtec 10 mg -Albuterol  available for rescue - cough and some wheeze a few months A/P:  allergies have bene tough (eyes and some headache(s)) but asthma has been ok   # B12 deficiency S: Current treatment/medication (oral vs. IM): 500 mcg daily  Lab Results  Component Value Date   VITAMINB12 271 05/03/2022  A/P: hopefully improved update b121 today. Continue current meds for now    # Anxiety/depression/insomnia S:Medication: Wellbutrin 150 mg extended release, Lexapro  20 mg, also uses melatonin for sleep, Lamictal per  psychiatry in 2025 starting A/P: feels doing reasonably well overall - but hasn't noted much change with Lamictal  yet but titrating dose   # GERD S:Medication: Dexilant  60 mg- intermittent issues and has to use Pepcid .  A/P: endoscopy 2018 and apparently was told dental infection dnot need repeat    #Vitamin D  deficiency S: Medication:  off lately Last vitamin D  Lab Results  Component Value Date   VD25OH 20.18 (L) 05/03/2022  A/P: #s low last year- update today- may need supplement   # Gabapentin -prescribed by Dr. Felipe Horton forlumbar radiculopathy noted in late April with plan for 6 to 8-week follow-up- helpful - will refill for him   Recommended follow up: Return in about 1 year (around 08/01/2024) for physical or sooner if needed.Schedule b4 you leave. Future Appointments  Date Time Provider Department Center  08/23/2023 12:45 PM Isidro Margo, DO [facility] None   Lab/Order associations:NOT fasting- coffee with milk   ICD-10-CM   1. Preventative health care  Z00.00     2. Snoring  R06.83 Ambulatory referral to Pulmonology    3. Screening for prostate cancer  Z12.5 PSA    4. Screening for diabetes mellitus  Z13.1 Hemoglobin A1c    5. Overweight  E66.3 Hemoglobin A1c    6. Hyperlipidemia, unspecified hyperlipidemia type  E78.5 Comprehensive metabolic panel with GFR    CBC with Differential/Platelet    Lipid panel    7. B12 deficiency  E53.8 Vitamin B12    8. Vitamin D  deficiency  E55.9 VITAMIN D  25 Hydroxy (Vit-D Deficiency, Fractures)    9. Need for pneumococcal 20-valent conjugate vaccination  Z23 Pneumococcal conjugate vaccine 20-valent (Prevnar 20)      No orders of the defined types were placed in this encounter.   Return precautions advised.  Clarisa Crooked, MD

## 2023-08-03 ENCOUNTER — Other Ambulatory Visit: Payer: Self-pay

## 2023-08-03 ENCOUNTER — Telehealth: Payer: Self-pay | Admitting: Family Medicine

## 2023-08-03 DIAGNOSIS — R972 Elevated prostate specific antigen [PSA]: Secondary | ICD-10-CM

## 2023-08-03 NOTE — Telephone Encounter (Signed)
 See below

## 2023-08-03 NOTE — Telephone Encounter (Signed)
 He can schedule when he returns

## 2023-08-03 NOTE — Telephone Encounter (Signed)
 Contacted pt to schedule lab appt in one month . Pt states he will be going out of the country in a month and will not be back till after July 4th . Please advise if ok for pt to have labs prior to a month or if he can schedule for when he returns .

## 2023-08-03 NOTE — Telephone Encounter (Signed)
-----   Message from [facility] Keba B sent at 08/03/2023 10:07 AM EDT ----- Pt viewed results and comments via mychart on 08/02/23 at 5:38 pm. Future PSA ordered.  ADMIN: please schedule repeat lab in 1 month for PSA.

## 2023-08-04 ENCOUNTER — Other Ambulatory Visit: Payer: Self-pay | Admitting: Cardiology

## 2023-08-04 NOTE — Telephone Encounter (Signed)
 See below

## 2023-08-23 ENCOUNTER — Encounter: Payer: Self-pay | Admitting: Family Medicine

## 2023-08-23 ENCOUNTER — Ambulatory Visit: Admitting: Family Medicine

## 2023-08-23 VITALS — BP 124/82 | HR 70 | Ht 69.0 in | Wt 181.0 lb

## 2023-08-23 DIAGNOSIS — M9902 Segmental and somatic dysfunction of thoracic region: Secondary | ICD-10-CM

## 2023-08-23 DIAGNOSIS — R293 Abnormal posture: Secondary | ICD-10-CM | POA: Diagnosis not present

## 2023-08-23 DIAGNOSIS — M9904 Segmental and somatic dysfunction of sacral region: Secondary | ICD-10-CM | POA: Diagnosis not present

## 2023-08-23 DIAGNOSIS — M9903 Segmental and somatic dysfunction of lumbar region: Secondary | ICD-10-CM | POA: Diagnosis not present

## 2023-08-23 DIAGNOSIS — M9901 Segmental and somatic dysfunction of cervical region: Secondary | ICD-10-CM

## 2023-08-23 DIAGNOSIS — M9908 Segmental and somatic dysfunction of rib cage: Secondary | ICD-10-CM

## 2023-08-23 NOTE — Assessment & Plan Note (Signed)
 Continue to work on posture, continuing to work on [employer] where she is doing a very good job.  Feels like his BMI is almost back to regular aspect.  We discussed avoiding certain repetitive activity but will be traveling for the summer.  Do expect patient to do well.  He will be traveling to [place] and multiple other areas.  Discussed icing regimen and home exercises.  Follow-up again in 6 to 8 weeks otherwise.

## 2023-08-23 NOTE — Progress Notes (Signed)
 [facility] [address] [place] [address] Phone: [phone] Subjective:   George [name], am serving as a scribe for Dr. Ronnell Coins.  I'm seeing this patient by the request  of:  Almira Jaeger, MD  CC: Back and neck pain follow-up  [id number]  George [name] is a 58 y.o. male coming in with complaint of back and neck pain. OMT on 07/07/2023. Patient states doing well overall. No new symptoms, just soreness from sitting this morning.  Medications patient has been prescribed:   Taking:         Reviewed prior external information including notes and imaging from previsou exam, outside providers and external EMR if available.   As well as notes that were available from care everywhere and other healthcare systems.  Past medical history, social, surgical and family history all reviewed in electronic medical record.  No pertanent information unless stated regarding to the chief complaint.   Past Medical History:  Diagnosis Date   Allergy    Anxiety    Asthma    with activity Mild   Colon polyps    GERD (gastroesophageal reflux disease)    Seasonal allergies     Allergies  Allergen Reactions   Trazodone  And Nefazodone     Syncopal episode      Review of Systems:  No headache, visual changes, nausea, vomiting, diarrhea, constipation, dizziness, abdominal pain, skin rash, fevers, chills, night sweats, weight loss, swollen lymph nodes, body aches, joint swelling, chest pain, shortness of breath, mood changes. POSITIVE muscle aches  Objective  Blood pressure 124/82, pulse 70, height 5\' 9"  (1.753 m), weight 181 lb (82.1 kg), SpO2 98%.   General: No apparent distress alert and oriented x3 mood and affect normal, dressed appropriately.  HEENT: Pupils equal, extraocular movements intact  Respiratory: Patient's speak in full sentences and does not appear short of breath  Cardiovascular: No lower extremity edema, non tender, no  erythema  Gait MSK:  Back does have some loss lordosis.  Some tenderness to palpation in the paraspinal musculature.  Patient does have tightness around the right sacroiliac joint.  Positive Veldon German on the right Neck exam does have some tightness noted especially with the last 5 degrees of extension.  Osteopathic findings  C2 flexed rotated and side bent right C6 flexed rotated and side bent left T3 extended rotated and side bent right inhaled rib T9 extended rotated and side bent left L2 flexed rotated and side bent right L3 flexed rotated and side bent left Sacrum right on right       Assessment and Plan:  Poor posture Continue to work on posture, continuing to work on [employer] where she is doing a very good job.  Feels like his BMI is almost back to regular aspect.  We discussed avoiding certain repetitive activity but will be traveling for the summer.  Do expect patient to do well.  He will be traveling to [place] and multiple other areas.  Discussed icing regimen and home exercises.  Follow-up again in 6 to 8 weeks otherwise.    Nonallopathic problems  Decision today to treat with OMT was based on Physical Exam  After verbal consent patient was treated with HVLA, ME, FPR techniques in cervical, rib, thoracic, lumbar, and sacral  areas  Patient tolerated the procedure well with improvement in symptoms  Patient given exercises, stretches and lifestyle modifications  See medications in patient instructions if given  Patient will follow up in 4-8  weeks     The above documentation has been reviewed and is accurate and complete George Margo, DO         Note: This dictation was prepared with Dragon dictation along with smaller phrase technology. Any transcriptional errors that result from this process are unintentional.

## 2023-10-02 ENCOUNTER — Encounter: Payer: Self-pay | Admitting: Family Medicine

## 2023-10-07 ENCOUNTER — Ambulatory Visit: Payer: Self-pay | Admitting: Family Medicine

## 2023-10-07 ENCOUNTER — Other Ambulatory Visit (INDEPENDENT_AMBULATORY_CARE_PROVIDER_SITE_OTHER)

## 2023-10-07 DIAGNOSIS — R972 Elevated prostate specific antigen [PSA]: Secondary | ICD-10-CM

## 2023-10-07 LAB — PSA: PSA: 1.39 ng/mL (ref 0.10–4.00)

## 2023-10-20 ENCOUNTER — Encounter: Payer: Self-pay | Admitting: Family Medicine

## 2023-10-20 ENCOUNTER — Ambulatory Visit: Admitting: Family Medicine

## 2023-10-20 VITALS — BP 122/86 | HR 74 | Ht 69.0 in | Wt 183.0 lb

## 2023-10-20 DIAGNOSIS — R293 Abnormal posture: Secondary | ICD-10-CM | POA: Diagnosis not present

## 2023-10-20 DIAGNOSIS — M9904 Segmental and somatic dysfunction of sacral region: Secondary | ICD-10-CM | POA: Diagnosis not present

## 2023-10-20 DIAGNOSIS — M9903 Segmental and somatic dysfunction of lumbar region: Secondary | ICD-10-CM

## 2023-10-20 DIAGNOSIS — M9901 Segmental and somatic dysfunction of cervical region: Secondary | ICD-10-CM | POA: Diagnosis not present

## 2023-10-20 DIAGNOSIS — M9908 Segmental and somatic dysfunction of rib cage: Secondary | ICD-10-CM

## 2023-10-20 DIAGNOSIS — M9902 Segmental and somatic dysfunction of thoracic region: Secondary | ICD-10-CM | POA: Diagnosis not present

## 2023-10-20 NOTE — Assessment & Plan Note (Signed)
 More postural SPECT and more of the scapular dyskinesis.  Discussed different treatment options.  Patient being extremely active at the moment which I think is beneficial.  Increase activity otherwise.  Continue to work on Financial controller and follow-up with me again in 6 to 8 weeks.

## 2023-10-20 NOTE — Progress Notes (Signed)
  [name] [address] [place] [address] Phone: [phone] Subjective:   George [name], am serving as a scribe for Dr. Arthea Claudene.  I'm seeing this patient by the request  of:  George Garnette KIDD, MD  CC: Back and neck pain  [name]  George [name] is a 58 y.o. male coming in with complaint of back and neck pain. OMT on 08/23/2023. Patient states doing well. No new symptoms.  Medications patient has been prescribed:   Taking:         Reviewed prior external information including notes and imaging from previsou exam, outside providers and external EMR if available.   As well as notes that were available from care everywhere and other healthcare systems.  Past medical history, social, surgical and family history all reviewed in electronic medical record.  No pertanent information unless stated regarding to the chief complaint.   Past Medical History:  Diagnosis Date   Allergy    Anxiety    Asthma    with activity Mild   Colon polyps    GERD (gastroesophageal reflux disease)    Seasonal allergies     Allergies  Allergen Reactions   Trazodone  And Nefazodone     Syncopal episode      Review of Systems:  No headache, visual changes, nausea, vomiting, diarrhea, constipation, dizziness, abdominal pain, skin rash, fevers, chills, night sweats, weight loss, swollen lymph nodes, body aches, joint swelling, chest pain, shortness of breath, mood changes. POSITIVE muscle aches  Objective  Blood pressure 122/86, pulse 74, height 5' 9 (1.753 m), weight 183 lb (83 kg), SpO2 98%.   General: No apparent distress alert and oriented x3 mood and affect normal, dressed appropriately.  HEENT: Pupils equal, extraocular movements intact  Respiratory: Patient's speak in full sentences and does not appear short of breath  Cardiovascular: No lower extremity edema, non tender, no erythema  Gait relatively normal MSK:  Back does have some  loss of lordosis noted.  Some tenderness to palpation in the paraspinal musculature.  Neck does have some limited sidebending bilaterally.  Osteopathic findings  C3 flexed rotated and side bent right C6 flexed rotated and side bent left T3 extended rotated and side bent right inhaled rib T9 extended rotated and side bent left L2 flexed rotated and side bent right Sacrum right on right       Assessment and Plan:  Poor posture More postural SPECT and more of the scapular dyskinesis.  Discussed different treatment options.  Patient being extremely active at the moment which I think is beneficial.  Increase activity otherwise.  Continue to work on Financial controller and follow-up with me again in 6 to 8 weeks.    Nonallopathic problems  Decision today to treat with OMT was based on Physical Exam  After verbal consent patient was treated with HVLA, ME, FPR techniques in cervical, rib, thoracic, lumbar, and sacral  areas  Patient tolerated the procedure well with improvement in symptoms  Patient given exercises, stretches and lifestyle modifications  See medications in patient instructions if given  Patient will follow up in 4-8 weeks    The above documentation has been reviewed and is accurate and complete George Kernodle M Vivia Rosenburg, DO          Note: This dictation was prepared with Dragon dictation along with smaller phrase technology. Any transcriptional errors that result from this process are unintentional.

## 2023-10-21 ENCOUNTER — Ambulatory Visit: Admitting: Primary Care

## 2023-10-24 ENCOUNTER — Encounter: Payer: Self-pay | Admitting: Family Medicine

## 2023-10-28 ENCOUNTER — Other Ambulatory Visit

## 2023-11-01 ENCOUNTER — Encounter: Payer: Self-pay | Admitting: Family Medicine

## 2023-11-01 ENCOUNTER — Ambulatory Visit: Admitting: Family Medicine

## 2023-11-01 VITALS — BP 122/74 | HR 79 | Temp 98.0°F | Wt 186.2 lb

## 2023-11-01 DIAGNOSIS — J209 Acute bronchitis, unspecified: Secondary | ICD-10-CM

## 2023-11-01 MED ORDER — BENZONATATE 100 MG PO CAPS: 100.0000 mg | ORAL_CAPSULE | Freq: Three times a day (TID) | ORAL | 0 refills | Status: DC | PRN

## 2023-11-01 MED ORDER — HYDROCODONE BIT-HOMATROP MBR 5-1.5 MG/5ML PO SOLN: 5.0000 mL | Freq: Four times a day (QID) | ORAL | 0 refills | Status: AC | PRN

## 2023-11-01 NOTE — Progress Notes (Signed)
 Established Patient Office Visit  Subjective   Patient ID: George [name], male    DOB: 18-Apr-1965  Age: 58 y.o. MRN: [id number]  Chief Complaint  Patient presents with   Cough    HPI    Mr Betten is seen today as a work in with 6-day history of cough productive of yellow sputum.  He states his 45 year old son had similar symptoms recently.  Shiloh denies any fever.  He had some frontal sinus congestion.  His cough has been particularly bothersome at night.  He has tried over-the-counter Robitussin without relief.  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  He had some difficulty with talking because of cough during the day as well.  Does have mild asthma and takes albuterol  rescue inhaler as needed.  Past Medical History:  Diagnosis Date   Allergy    Anxiety    Asthma    with activity Mild   Colon polyps    GERD (gastroesophageal reflux disease)    Seasonal allergies    Past Surgical History:  Procedure Laterality Date   COLONOSCOPY     UPPER GASTROINTESTINAL ENDOSCOPY      reports that he has never smoked. He has never used smokeless tobacco. He reports current alcohol use. He reports that he does not use drugs. family history includes Asthma in his father; Bipolar disorder in his brother; Bladder Cancer in his father; CAD in his father; Colon cancer in his maternal grandmother; Colon polyps in his mother; Healthy in his sister; Hypertension in his father; Obesity in his father; Rectal cancer in his father. Allergies  Allergen Reactions   Trazodone  And Nefazodone     Syncopal episode     Review of Systems  Constitutional:  Negative for chills and fever.  HENT:  Negative for sore throat.   Respiratory:  Positive for cough and sputum production. Negative for hemoptysis.       Objective:     BP 122/74   Pulse 79   Temp 98 F (36.7 C) (Oral)   Wt 186 lb 3.2 oz (84.5 kg)   SpO2 97%   BMI 27.50 kg/m  BP Readings from Last 3 Encounters:   11/01/23 122/74  10/20/23 122/86  08/23/23 124/82   Wt Readings from Last 3 Encounters:  11/01/23 186 lb 3.2 oz (84.5 kg)  10/20/23 183 lb (83 kg)  08/23/23 181 lb (82.1 kg)      Physical Exam Vitals reviewed.  Constitutional:      General: He is not in acute distress.    Appearance: He is not ill-appearing.  HENT:     Right Ear: Tympanic membrane normal.     Left Ear: Tympanic membrane normal.  Cardiovascular:     Rate and Rhythm: Normal rate and regular rhythm.  Pulmonary:     Effort: Pulmonary effort is normal.     Breath sounds: Normal breath sounds. No wheezing or rales.  Musculoskeletal:     Cervical back: Neck supple.  Lymphadenopathy:     Cervical: No cervical adenopathy.  Neurological:     Mental Status: He is alert.      No results found for any visits on 11/01/23.    The 10-year ASCVD risk score (Arnett DK, et al., 2019) is: 4%    Assessment & Plan:   Problem List Items Addressed This Visit   None Visit Diagnoses       Acute bronchitis, unspecified organism    -  Primary  6-day history of productive cough.  Nonfocal exam.  Suspect acute viral bronchitis.  Recommend trial of Tessalon  Perles 100 mg every 8 hours as needed for cough.  Also wrote prescription for Hycodan cough syrup 1 teaspoon nightly as needed for severe cough if not relieved with Tessalon .  Follow-up promptly for any fever, increased shortness of breath, or other concern.  No follow-ups on file.    Wolm Scarlet, MD

## 2023-11-11 ENCOUNTER — Other Ambulatory Visit: Payer: Self-pay | Admitting: Family Medicine

## 2023-11-15 ENCOUNTER — Encounter: Payer: Self-pay | Admitting: Family Medicine

## 2023-12-20 ENCOUNTER — Ambulatory Visit (INDEPENDENT_AMBULATORY_CARE_PROVIDER_SITE_OTHER): Admitting: Primary Care

## 2023-12-20 ENCOUNTER — Encounter: Payer: Self-pay | Admitting: Primary Care

## 2023-12-20 VITALS — BP 121/75 | HR 65 | Temp 97.5°F | Ht 68.0 in | Wt 185.6 lb

## 2023-12-20 DIAGNOSIS — K219 Gastro-esophageal reflux disease without esophagitis: Secondary | ICD-10-CM | POA: Diagnosis not present

## 2023-12-20 DIAGNOSIS — Z23 Encounter for immunization: Secondary | ICD-10-CM

## 2023-12-20 DIAGNOSIS — J4520 Mild intermittent asthma, uncomplicated: Secondary | ICD-10-CM

## 2023-12-20 DIAGNOSIS — G4730 Sleep apnea, unspecified: Secondary | ICD-10-CM | POA: Diagnosis not present

## 2023-12-20 DIAGNOSIS — R053 Chronic cough: Secondary | ICD-10-CM

## 2023-12-20 MED ORDER — BENZONATATE 100 MG PO CAPS: 100.0000 mg | ORAL_CAPSULE | Freq: Three times a day (TID) | ORAL | 0 refills | Status: AC | PRN

## 2023-12-20 NOTE — Patient Instructions (Addendum)
 VISIT SUMMARY: Today, we discussed your persistent snoring and related health concerns. We reviewed your previous sleep study results and addressed your chronic cough, asthma, and gastroesophageal reflux disease (GERD). We also talked about your weight and its impact on your overall health.  YOUR PLAN: -MILD OBSTRUCTIVE SLEEP APNEA: Mild obstructive sleep apnea is a condition where your breathing stops and starts during sleep, causing snoring and potential health risks. We recommend losing 10-15 pounds, sleeping on your side, and using a wedge pillow to elevate your head. If your symptoms worsen or you experience excessive daytime sleepiness, we may consider a CPAP machine, but a repeat sleep study would be needed for insurance coverage.  -CHRONIC COUGH: Your chronic dry cough may be related to GERD or asthma. To manage this, avoid foods that trigger reflux, use a wedge pillow to elevate your head during sleep, and take Tessalon  Perles as needed for cough suppression, up to three times a day. Sipping water and using hard candies can also help with throat irritation.  -ASTHMA: Asthma is a condition where your airways become inflamed and narrow, making it hard to breathe. Continue using your Symbicort  inhaler with two puffs in the morning and two puffs in the evening, and remember to rinse your mouth after each use.  -GASTROESOPHAGEAL REFLUX DISEASE (GERD): GERD is a condition where stomach acid frequently flows back into the tube connecting your mouth and stomach, causing irritation. Continue taking Dexilant , avoid foods that trigger reflux, and use a wedge pillow to elevate your head during sleep.  -OVERWEIGHT: Being overweight can contribute to various health issues, including sleep apnea. We encourage you to lose 10-15 pounds through a healthy diet and regular exercise, which can improve your sleep apnea and overall health.  INSTRUCTIONS: Please follow up with us  if your symptoms worsen or if  you experience excessive daytime sleepiness. Consider scheduling a repeat sleep study if needed for insurance coverage of a CPAP machine.  Orders: Flu shot   Follow-up As needed if sleep worsens or you develop excessive daytime sleepiness    GERD in Adults: Diet Changes When you have gastroesophageal reflux disease (GERD), you may need to make changes to your diet. Choosing the right foods can help with your symptoms. Think about working with an expert in healthy eating called a dietitian. They can help you make healthy food choices. What are tips for following this plan? Reading food labels Look for foods that are low in saturated fat. Foods that may help with your symptoms include: Foods with less than 5% of daily value (DV) of fat. Foods with 0 grams of trans fat. Cooking [employer] in ways that don't use a lot of fat. These ways include: Baking. Steaming. Grilling. Broiling. To add flavor, try to use herbs that are low in spice and acidity. Avoid frying your food. Meal planning  Eat small meals often rather than eating 3 large meals each day. Eat your meals slowly in a place where you feel relaxed. If told by your health care provider, avoid: Foods that cause symptoms. Keep a food diary to keep track of foods that cause symptoms. Alcohol. Drinking a lot of liquid with meals. General instructions For 2-3 hours after you eat, avoid: Bending over. Exercise. Lying down. Chew sugar-free gum after meals. What foods should I eat? Eat a healthy diet. Try to include: Foods with high amounts of fiber. These include: Fruits and vegetables. Whole grains and beans. Low-fat dairy products. Lean meats, fish, and poultry.  Egg whites. Foods that cause symptoms in someone else may not cause symptoms for you. Work with your provider to find foods that are safe for you. The items listed above may not be all the foods and drinks you can have. Talk with a dietitian to learn  more. The items listed above may not be a complete list of foods and beverages you can eat and drink. Contact a dietitian for more information. What foods should I avoid? Limiting some of these foods may help with your symptoms. Each person is different. Talk with a dietitian or your provider to help you find the exact foods to avoid. Some of the foods to avoid may include: Fruits Fruits with a lot of acid in them. These may include citrus fruits, such as oranges, grapefruit, pineapple, and lemons. Vegetables Deep-fried vegetables, such as [place] fries. Vegetables, sauces, or toppings made with added fat and vegetables with acid in them. These may include tomatoes and tomato products, chili peppers, onions, garlic, and horseradish. Grains Pastries or quick breads with added fat. Meats and other proteins High-fat meats, such as fatty beef or pork, hot dogs, ribs, ham, sausage, salami, and bacon. Fried meat or protein, such as fried fish and fried chicken. Egg yolks. Fats and oils Butter. Margarine. Shortening. Ghee. Drinks Coffee and other drinks with caffeine in them. Fizzy and sugary drinks, such as soda and energy drinks. Fruit juice made with acidic fruits, such as orange or grapefruit. Tomato juice. Sweets and desserts Chocolate and cocoa. Donuts. Seasonings and condiments Mint, such as peppermint and spearmint. Condiments, herbs, or seasonings that cause symptoms. These may include curry, hot sauce, or vinegar-based salad dressings. The items listed above may not be all the foods and drinks you should avoid. Talk with a dietitian to learn more. Questions to ask your health care provider Changes to your diet and everyday life are often the first steps taken to manage symptoms of GERD. If these changes don't help, talk with your provider about taking medicines. Where to find more information [employer]: aboutgerd.org This information is not  intended to replace advice given to you by your health care provider. Make sure you discuss any questions you have with your health care provider. Document Revised: 01/11/2023 Document Reviewed: 07/28/2022 Elsevier Patient Education  2024 [employer].

## 2023-12-20 NOTE — Progress Notes (Signed)
 @Patient  ID: Deward Billet, male    DOB: [birth year], 58 y.o.   MRN: [id number]  No chief complaint on file.   Referring provider: Katrinka Garnette KIDD, MD  HPI: 58 year old male. PMH significant for aortic atherosclerosis, mild sleep apnea, extrinsic asthma, insomnia, IBS, hyperlipidemia, B12 deficiency.  Former Dr. Neda patient, last seen in October 2021.    12/20/2023 Discussed the use of AI scribe software for clinical note transcription with the patient, who gave verbal consent to proceed.  History of Present Illness Devondre Guzzetta is a 58 year old male who presents with persistent snoring. He was referred by his primary care doctor for evaluation of persistent snoring.  He underwent a sleep study in January 2022, which showed mild sleep apnea with an average of 11.8 apneic events per hour and minimal oxygen desaturation. Despite these findings, he continues to experience significant snoring. He is reluctant to repeat the sleep study due to cost concerns. No excessive daytime sleepiness, such as falling asleep while driving or at work, but he does experience fatigue in the late afternoon. His BMI is 28, placing him in the overweight category, and he acknowledges difficulty with weight loss despite regular exercise and a good diet. He has tried over-the-counter mouthguards for sleep apnea but found them uncomfortable.  Typical bedtime 11pm-12am. It does not take him long to fall asleep. He wakes up between 0-2 times a night. Starts his day between 7-8am. Weight is up 5 lbs. He is asymptomatic, feels he sleeps well. No excessive daytime sleepiness. Wife reports snoring.   He reports a persistent dry cough since August, which he suspects may be related to past bronchitis. He has asthma and uses Symbicort  once to twice daily, and he also has reflux, which he manages with Dexilant . The cough is particularly bothersome when lying down at night. No wheezing, chest tightness, or productive  cough.   Allergies  Allergen Reactions   Trazodone  And Nefazodone     Syncopal episode     Immunization History  Administered Date(s) Administered   Influenza Split 01/27/2012, 12/13/2012, 12/13/2013   Influenza Whole 12/08/2007   Influenza,inj,Quad PF,6+ Mos 12/05/2014, 12/22/2017, 12/08/2018, 11/27/2019, 12/26/2020   Influenza-Unspecified 12/02/2021   PFIZER(Purple Top)SARS-COV-2 Vaccination 05/19/2019, 06/09/2019, 12/28/2019   PNEUMOCOCCAL CONJUGATE-20 08/02/2023   Pfizer Covid-19 Vaccine Bivalent Booster 45yrs & up 12/26/2020   Pneumococcal Polysaccharide-23 06/25/2011   Tdap 06/25/2011, 05/03/2022   Unspecified SARS-COV-2 Vaccination 12/02/2021   Zoster Recombinant(Shingrix ) 02/22/2022, 01/31/2023    Past Medical History:  Diagnosis Date   Allergy    Anxiety    Asthma    with activity Mild   Colon polyps    GERD (gastroesophageal reflux disease)    Seasonal allergies     Tobacco History: Social History   Tobacco Use  Smoking Status Never  Smokeless Tobacco Never   Counseling given: Not Answered   Outpatient Medications Prior to Visit  Medication Sig Dispense Refill   albuterol  (VENTOLIN  HFA) 108 (90 Base) MCG/ACT inhaler Inhale 2 puffs into the lungs every 6 (six) hours as needed for wheezing or shortness of breath. 1 each 2   benzonatate  (TESSALON  PERLES) 100 MG capsule Take 1 capsule (100 mg total) by mouth 3 (three) times daily as needed. 30 capsule 0   budesonide -formoterol  (SYMBICORT ) 160-4.5 MCG/ACT inhaler INHALE 2 PUFFS INTO THE LUNGS TWICE A DAY 30.6 each 3   buPROPion (WELLBUTRIN XL) 150 MG 24 hr tablet TAKE 1 TABLET BY MOUTH EVERY MORNING AFTER BREAKFAST Oral for  30 Days     cetirizine (ZYRTEC) 10 MG tablet Take 10 mg by mouth at bedtime.     cyanocobalamin  (VITAMIN B12) 500 MCG tablet Take 1,000 mcg by mouth daily.     DEXILANT  60 MG capsule TAKE ONE CAPSULE BY MOUTH DAILY 90 capsule 2   escitalopram  (LEXAPRO ) 20 MG tablet TAKE 1 TABLET BY MOUTH  EVERY DAY 90 tablet 3   ezetimibe  (ZETIA ) 10 MG tablet Take 1 tablet (10 mg total) by mouth daily. 90 tablet 3   fluticasone  (FLONASE ) 50 MCG/ACT nasal spray Place 2 sprays into both nostrils daily. 16 g 11   gabapentin  (NEURONTIN ) 100 MG capsule Take 2 capsules (200 mg total) by mouth at bedtime. 180 capsule 1   HYDROcodone  bit-homatropine (HYCODAN) 5-1.5 MG/5ML syrup Take 5 mLs by mouth every 6 (six) hours as needed. 120 mL 0   lamoTRIgine (LAMICTAL) 25 MG tablet Take 25 mg by mouth 2 (two) times daily.     Melatonin 5 MG TABS Take by mouth.     rosuvastatin  (CRESTOR ) 20 MG tablet TAKE 1 TABLET BY MOUTH EVERY DAY 90 tablet 3   Vitamin D , Ergocalciferol , (DRISDOL ) 1.25 MG (50000 UNIT) CAPS capsule Take 1 capsule (50,000 Units total) by mouth every 7 (seven) days. 13 capsule 1   No facility-administered medications prior to visit.      Review of Systems  Review of Systems  Constitutional: Negative.  Negative for fatigue.  Respiratory:  Positive for cough.   Psychiatric/Behavioral:  Negative for sleep disturbance.    Physical Exam  There were no vitals taken for this visit. Physical Exam Constitutional:      Appearance: Normal appearance. He is well-developed.  HENT:     Head: Normocephalic and atraumatic.     Mouth/Throat:     Mouth: Mucous membranes are moist.     Pharynx: Oropharynx is clear.     Comments: Mallampati class I-II Cardiovascular:     Rate and Rhythm: Normal rate and regular rhythm.     Heart sounds: Normal heart sounds.  Pulmonary:     Effort: Pulmonary effort is normal. No respiratory distress.     Breath sounds: Normal breath sounds. No wheezing or rhonchi.  Musculoskeletal:        General: Normal range of motion.     Cervical back: Normal range of motion and neck supple.  Skin:    General: Skin is warm and dry.     Findings: No erythema or rash.  Neurological:     General: No focal deficit present.     Mental Status: He is alert and oriented to  person, place, and time. Mental status is at baseline.  Psychiatric:        Mood and Affect: Mood normal.        Behavior: Behavior normal.        Thought Content: Thought content normal.        Judgment: Judgment normal.       Lab Results:  CBC    Component Value Date/Time   WBC 6.5 08/02/2023 1003   RBC 4.91 08/02/2023 1003   HGB 15.2 08/02/2023 1003   HCT 44.6 08/02/2023 1003   PLT 229.0 08/02/2023 1003   MCV 90.9 08/02/2023 1003   MCH 31.9 11/27/2019 1437   MCHC 34.0 08/02/2023 1003   RDW 12.8 08/02/2023 1003   LYMPHSABS 1.7 08/02/2023 1003   MONOABS 0.6 08/02/2023 1003   EOSABS 0.2 08/02/2023 1003   BASOSABS 0.0 08/02/2023 1003  BMET    Component Value Date/Time   NA 141 08/02/2023 1003   K 4.7 08/02/2023 1003   CL 103 08/02/2023 1003   CO2 28 08/02/2023 1003   GLUCOSE 90 08/02/2023 1003   BUN 24 (H) 08/02/2023 1003   CREATININE 1.12 08/02/2023 1003   CREATININE 1.21 11/27/2019 1437   CALCIUM  9.6 08/02/2023 1003   GFRNONAA 68 11/27/2019 1437   GFRAA 79 11/27/2019 1437    BNP No results found for: BNP  ProBNP No results found for: PROBNP  Imaging: No results found.   Assessment & Plan:   1. Mild sleep apnea (Primary)  2. Chronic cough  3. Mild intermittent asthma without complication  4. Gastroesophageal reflux disease, unspecified whether esophagitis present  Assessment and Plan Assessment & Plan Mild obstructive sleep apnea Mild obstructive sleep apnea with 11.8 apneic events per hour and minimal oxygen desaturation. No excessive daytime sleepiness reported. Minimal risk for increased cardiac arrhythmia, stroke, pulmonary hypertension, and diabetes due to mild sleep apnea. Discussed potential need for repeat sleep study, however, patient did not wish to pursue further testing at this time. Declined referral to orthodontics due to high cost for oral appliance. - Encourage weight loss of 10-15 pounds to potentially improve symptoms. -  Advise on positional sleep, emphasizing side sleeping. - Discuss potential use of wedge pillow to elevate head during sleep. - Consider CPAP if symptoms worsen or if excessive daytime sleepiness develops, with repeat sleep study required for insurance coverage.  Chronic cough Chronic dry cough possibly related to GERD, asthma or PND. No wheezing or chest tightness. GERD suspected as a contributing factor, especially with symptoms worsening when lying down. - Advise dietary modifications to manage GERD, avoiding citrus, tomatoes, chocolate, caffeine, and fried foods. - Recommend elevating head during sleep with a wedge pillow. - Encourage sips of water and use of hard candies to manage throat irritation. - Prescribe Tessalon  Perles as needed for cough suppression, up to three times a day.  Asthma Asthma with exercise-induced symptoms. Currently using Symbicort  inhaler. Lung clear on exam without overt wheezing. Occasional chest tightness.  - Instruct to use Symbicort  160mcg two puffs in the morning and two puffs in the evening. - Advise rinsing mouth after inhaler use.  Gastroesophageal reflux disease (GERD) GERD with symptoms exacerbated by certain dietary factors and positional changes. Currently managed with Dexilant . - Continue Dexilant  for GERD management. - Advise dietary modifications to avoid reflux triggers. - Recommend elevating head during sleep with a wedge pillow.  Overweight BMI of 28, categorized as overweight. Weight loss could improve mild obstructive sleep apnea and overall health. - Encourage weight loss of 10-15 pounds. - Discuss challenges with weight loss and emphasize continued efforts in diet and exercise.   Almarie LELON Ferrari, NP 12/20/2023

## 2023-12-22 NOTE — Progress Notes (Deleted)
  [facility] [address] [place] [address] Phone: [phone] Subjective:    I'm seeing this patient by the request  of:  Katrinka Garnette KIDD, MD  CC: Neck and back pain  [name]  George [name] is a 58 y.o. male coming in with complaint of back and neck pain. OMT 10/20/2023. Patient states   Medications patient has been prescribed: None  Taking:     Patient has been diagnosed with sleep apnea.  Is working on weight loss and sleep positioning before they consider CPAP machine.    Reviewed prior external information including notes and imaging from previsou exam, outside providers and external EMR if available.   As well as notes that were available from care everywhere and other healthcare systems.  Past medical history, social, surgical and family history all reviewed in electronic medical record.  No pertanent information unless stated regarding to the chief complaint.   Past Medical History:  Diagnosis Date   Allergy    Anxiety    Asthma    with activity Mild   Colon polyps    GERD (gastroesophageal reflux disease)    Seasonal allergies     Allergies  Allergen Reactions   Trazodone  And Nefazodone     Syncopal episode      Review of Systems:  No headache, visual changes, nausea, vomiting, diarrhea, constipation, dizziness, abdominal pain, skin rash, fevers, chills, night sweats, weight loss, swollen lymph nodes, body aches, joint swelling, chest pain, shortness of breath, mood changes. POSITIVE muscle aches  Objective  There were no vitals taken for this visit.   General: No apparent distress alert and oriented x3 mood and affect normal, dressed appropriately.  HEENT: Pupils equal, extraocular movements intact  Respiratory: Patient's speak in full sentences and does not appear short of breath  Cardiovascular: No lower extremity edema, non tender, no erythema  Mild tightness noted in the neck with sidebending bilaterally.   Tenderness to palpation in the parascapular area right greater than left.  Osteopathic findings  C3 flexed rotated and side bent right C7 flexed rotated and side bent left T3 extended rotated and side bent right inhaled rib T6 extended rotated and side bent left L2 flexed rotated and side bent right L3 flexed rotated and side bent left Sacrum right on right     Assessment and Plan:  No problem-specific Assessment & Plan notes found for this encounter.    Nonallopathic problems  Decision today to treat with OMT was based on Physical Exam  After verbal consent patient was treated with HVLA, ME, FPR techniques in cervical, rib, thoracic, lumbar, and sacral  areas  Patient tolerated the procedure well with improvement in symptoms  Patient given exercises, stretches and lifestyle modifications  See medications in patient instructions if given  Patient will follow up in 4-8 weeks     The above documentation has been reviewed and is accurate and complete George Wickens M Rey Dansby, DO       Note: This dictation was prepared with Dragon dictation along with smaller phrase technology. Any transcriptional errors that result from this process are unintentional.

## 2023-12-27 ENCOUNTER — Ambulatory Visit: Admitting: Family Medicine

## 2023-12-28 ENCOUNTER — Encounter: Payer: Self-pay | Admitting: Family Medicine

## 2024-01-10 NOTE — Progress Notes (Unsigned)
 [facility] [address] [place] [address] Phone: [phone] Subjective:   George [name], am serving as a scribe for Dr. Arthea Claudene.  I'm seeing this patient by the request  of:  Katrinka Garnette KIDD, MD  CC: Back and neck pain follow-up  [name]  George [name] is a 58 y.o. male coming in with complaint of back and neck pain. OMT 10/20/2023. Patient states that he started to have an increase in pain one week ago. Pain in lumbar spine that radaites into both glutes.   Medications patient has been prescribed: None  Taking:         Reviewed prior external information including notes and imaging from previsou exam, outside providers and external EMR if available.   As well as notes that were available from care everywhere and other healthcare systems.  Past medical history, social, surgical and family history all reviewed in electronic medical record.  No pertanent information unless stated regarding to the chief complaint.   Past Medical History:  Diagnosis Date   Allergy    Anxiety    Asthma    with activity Mild   Colon polyps    GERD (gastroesophageal reflux disease)    Seasonal allergies     Allergies  Allergen Reactions   Trazodone  And Nefazodone     Syncopal episode      Review of Systems:  No headache, visual changes, nausea, vomiting, diarrhea, constipation, dizziness, abdominal pain, skin rash, fevers, chills, night sweats, weight loss, swollen lymph nodes, body aches, joint swelling, chest pain, shortness of breath, mood changes. POSITIVE muscle aches  Objective  Blood pressure 114/82, pulse 67, height 5' 8 (1.727 m), weight 182 lb (82.6 kg), SpO2 99%.   General: No apparent distress alert and oriented x3 mood and affect normal, dressed appropriately.  HEENT: Pupils equal, extraocular movements intact  Respiratory: Patient's speak in full sentences and does not appear short of breath  Cardiovascular: No lower  extremity edema, non tender, no erythema  Gait relatively normal MSK:  Back does have some very mild loss of lordosis.  Neck exam does have some tightness noted.  Negative straight leg test on the back.  More discomfort in the thoracolumbar juncture noted.  Osteopathic findings  C2 flexed rotated and side bent right C6 flexed rotated and side bent left T3 extended rotated and side bent right inhaled rib T11 extended rotated and side bent left L1 flexed rotated and side bent right L3 flexed rotated and side bent left Sacrum right on right       Assessment and Plan:  Poor posture Patient is doing working in a more repetitive environment and we discussed potentially changing certain ergonomics so patient is rotating both ways instead of repetitively only 1 way.  We discussed continuing to keep core stabilize during the motion and also changing seated to standing positioning.  Patient will increase activity slowly otherwise.  Follow-up again in 6 to 12 weeks.    Nonallopathic problems  Decision today to treat with OMT was based on Physical Exam  After verbal consent patient was treated with HVLA, ME, FPR techniques in cervical, rib, thoracic, lumbar, and sacral  areas  Patient tolerated the procedure well with improvement in symptoms  Patient given exercises, stretches and lifestyle modifications  See medications in patient instructions if given  Patient will follow up in 6-8 weeks     The above documentation has been reviewed and is accurate and complete Elyn Krogh M  Claudene, DO         Note: This dictation was prepared with Dragon dictation along with smaller phrase technology. Any transcriptional errors that result from this process are unintentional.

## 2024-01-12 ENCOUNTER — Ambulatory Visit: Admitting: Family Medicine

## 2024-01-12 ENCOUNTER — Encounter: Payer: Self-pay | Admitting: Family Medicine

## 2024-01-12 VITALS — BP 114/82 | HR 67 | Ht 68.0 in | Wt 182.0 lb

## 2024-01-12 DIAGNOSIS — R293 Abnormal posture: Secondary | ICD-10-CM | POA: Diagnosis not present

## 2024-01-12 DIAGNOSIS — M9902 Segmental and somatic dysfunction of thoracic region: Secondary | ICD-10-CM

## 2024-01-12 DIAGNOSIS — M9901 Segmental and somatic dysfunction of cervical region: Secondary | ICD-10-CM | POA: Diagnosis not present

## 2024-01-12 DIAGNOSIS — M9908 Segmental and somatic dysfunction of rib cage: Secondary | ICD-10-CM | POA: Diagnosis not present

## 2024-01-12 DIAGNOSIS — M9903 Segmental and somatic dysfunction of lumbar region: Secondary | ICD-10-CM

## 2024-01-12 DIAGNOSIS — M9904 Segmental and somatic dysfunction of sacral region: Secondary | ICD-10-CM | POA: Diagnosis not present

## 2024-01-12 NOTE — Assessment & Plan Note (Signed)
 Patient is doing working in a more repetitive environment and we discussed potentially changing certain ergonomics so patient is rotating both ways instead of repetitively only 1 way.  We discussed continuing to keep core stabilize during the motion and also changing seated to standing positioning.  Patient will increase activity slowly otherwise.  Follow-up again in 6 to 12 weeks.

## 2024-01-13 ENCOUNTER — Other Ambulatory Visit: Payer: Self-pay

## 2024-01-13 MED ORDER — VITAMIN D (ERGOCALCIFEROL) 1.25 MG (50000 UNIT) PO CAPS: 50000.0000 [IU] | ORAL_CAPSULE | ORAL | 0 refills | Status: DC

## 2024-02-02 ENCOUNTER — Telehealth: Payer: Self-pay | Admitting: Family Medicine

## 2024-02-02 NOTE — Telephone Encounter (Signed)
 Type of form received:[facility] Patient Assistance Program  Additional comments:   Received by: Therisa w  Form should be Faxed to:  Form should be mailed to:    Is patient requesting call for pickup: yes   Form placed:  pcp box  Attach charge sheet.   Individual made aware of 3-5 business day turn around (Y/N)?  yes

## 2024-02-02 NOTE — Telephone Encounter (Signed)
 Completed and faxed.

## 2024-02-06 NOTE — Telephone Encounter (Signed)
 LVM TO PU 02/06/24

## 2024-02-21 ENCOUNTER — Ambulatory Visit: Admitting: Family Medicine

## 2024-02-21 ENCOUNTER — Encounter: Payer: Self-pay | Admitting: Family Medicine

## 2024-02-21 VITALS — BP 130/88 | HR 75 | Temp 98.1°F | Ht 68.0 in | Wt 187.0 lb

## 2024-02-21 DIAGNOSIS — J329 Chronic sinusitis, unspecified: Secondary | ICD-10-CM

## 2024-02-21 DIAGNOSIS — B9689 Other specified bacterial agents as the cause of diseases classified elsewhere: Secondary | ICD-10-CM

## 2024-02-21 NOTE — Progress Notes (Signed)
 Phone [phone] In person visit   Subjective:   George [name] is a 58 y.o. year old very pleasant male patient who presents for/with See problem oriented charting Chief Complaint  Patient presents with   Headache    Headaches x2 weeks; possible sinus involvement; daily headaches, come on at different times but consistently daily; blowing out yellow mucus and seeing it also in netti pot solution after rinse;     Past Medical History-  Patient Active Problem List   Diagnosis Date Noted   Aortic atherosclerosis 04/10/2021    Priority: Medium    Mild sleep apnea 05/27/2020    Priority: Medium    GAD (generalized anxiety disorder) 03/17/2020    Priority: Medium    Major depressive disorder with single episode, in full remission 03/17/2020    Priority: Medium    Hyperlipidemia, unspecified 07/03/2019    Priority: Medium    Vitamin D  deficiency 03/21/2018    Priority: Medium    B12 deficiency 03/21/2018    Priority: Medium    Allergic rhinitis due to pollen 04/23/2015    Priority: Medium    Extrinsic asthma 08/11/2010    Priority: Medium    Insomnia 08/11/2010    Priority: Medium    GERD 12/08/2007    Priority: Medium    Irritable bowel syndrome 12/08/2007    Priority: Medium    Rosacea 05/03/2022    Priority: Low   Seborrheic keratoses 05/03/2022    Priority: Low   Trigger thumb of left hand 12/16/2022    Priority: 1.   Calcific tendinitis of lower leg, right 02/17/2022    Priority: 1.   Greater trochanteric bursitis of right hip 07/08/2021    Priority: 1.   Whiplash 12/09/2020    Priority: 1.   Foot contusion 12/09/2020    Priority: 1.   Right knee pain 06/04/2020    Priority: 1.   Hamstring tightness of right lower extremity 11/02/2019    Priority: 1.   Callus of foot 05/17/2019    Priority: 1.   Poor posture 12/22/2017    Priority: 1.   Capsulitis of metatarsophalangeal (MTP) joint of left foot 12/22/2017    Priority: 1.   Nonallopathic lesion of  cervical region 10/28/2017    Priority: 1.   Nonallopathic lesion of rib cage 10/28/2017    Priority: 1.   Tibialis posterior tendinitis, right 08/30/2017    Priority: 1.   Extensor intersection syndrome of right wrist 02/24/2017    Priority: 1.   ECU (extensor carpi ulnaris), subluxation/dislocation, right, initial encounter 07/19/2016    Priority: 1.   Lumbar radiculopathy 08/14/2015    Priority: 1.   Nonallopathic lesion of thoracic region 05/19/2015    Priority: 1.   Nonallopathic lesion of lumbosacral region 05/19/2015    Priority: 1.   Nonallopathic lesion of sacral region 05/19/2015    Priority: 1.   Piriformis syndrome of right side 04/24/2015    Priority: 1.    Medications- reviewed and updated Current Outpatient Medications  Medication Sig Dispense Refill   albuterol  (VENTOLIN  HFA) 108 (90 Base) MCG/ACT inhaler Inhale 2 puffs into the lungs every 6 (six) hours as needed for wheezing or shortness of breath. 1 each 2   budesonide -formoterol  (SYMBICORT ) 160-4.5 MCG/ACT inhaler INHALE 2 PUFFS INTO THE LUNGS TWICE A DAY 30.6 each 3   buPROPion (WELLBUTRIN XL) 150 MG 24 hr tablet TAKE 1 TABLET BY MOUTH EVERY MORNING AFTER BREAKFAST Oral for 30 Days     cetirizine (ZYRTEC) 10  MG tablet Take 10 mg by mouth at bedtime.     cyanocobalamin  (VITAMIN B12) 500 MCG tablet Take 1,000 mcg by mouth daily.     DEXILANT  60 MG capsule TAKE ONE CAPSULE BY MOUTH DAILY 90 capsule 2   escitalopram  (LEXAPRO ) 20 MG tablet TAKE 1 TABLET BY MOUTH EVERY DAY 90 tablet 3   ezetimibe  (ZETIA ) 10 MG tablet Take 1 tablet (10 mg total) by mouth daily. 90 tablet 3   fluticasone  (FLONASE ) 50 MCG/ACT nasal spray Place 2 sprays into both nostrils daily. 16 g 11   gabapentin  (NEURONTIN ) 100 MG capsule Take 2 capsules (200 mg total) by mouth at bedtime. 180 capsule 1   HYDROcodone  bit-homatropine (HYCODAN) 5-1.5 MG/5ML syrup Take 5 mLs by mouth every 6 (six) hours as needed. 120 mL 0   lamoTRIgine (LAMICTAL) 25  MG tablet Take 25 mg by mouth 2 (two) times daily.     Melatonin 5 MG TABS Take by mouth.     rosuvastatin  (CRESTOR ) 20 MG tablet TAKE 1 TABLET BY MOUTH EVERY DAY 90 tablet 3   Vitamin D , Ergocalciferol , (DRISDOL ) 1.25 MG (50000 UNIT) CAPS capsule Take 1 capsule (50,000 Units total) by mouth every 7 (seven) days. 12 capsule 0   benzonatate  (TESSALON  PERLES) 100 MG capsule Take 1 capsule (100 mg total) by mouth 3 (three) times daily as needed. (Patient not taking: Reported on 02/21/2024) 30 capsule 0   No current facility-administered medications for this visit.     Objective:  BP 130/88 (BP Location: Left Arm, Patient Position: Sitting, Cuff Size: Normal)   Pulse 75   Temp 98.1 F (36.7 C) (Temporal)   Ht 5' 8 (1.727 m)   Wt 187 lb (84.8 kg)   SpO2 97%   BMI 28.43 kg/m  Gen: NAD, resting comfortably  HEENT: Turbinates erythematous with yellow drainage, TM normal, pharynx mildly erythematous with no tonsilar exudate or edema, left maxillary sinus tenderness as well as central portion of bilateral frontal sinus tenderness   CV: RRR no murmurs rubs or gallops Lungs: CTAB no crackles, wheeze, rhonchi Ext: no edema Skin: warm, dry    Assessment and Plan   # Sinus pressure/headaches S: Patient reports maxillary sinus pressure and discharge for 2 weeks.  Bothersome at during levels throughout the day- hits him more at certain times up to 4-5/10 at worst.  Has yellow mucus and also noting that in the Nettie pot he has been using. Also gets some headaches just above base of neck in the mornings but mots of his issues are in sinus areas throughout the day- below and above eyes.  - No fever or chills.  No shortness of breath.  No wheezing.  No vision change - doing sudafed some and advil  some- advil  helps some -pretty steady so far A/P: Sinus infection/Sinusitis Bacterial based on: Symptoms >10 days -14 days into symptom(s) checking influenza and COVID would not change management so we did  not test   Treatment: Antibiotic indicated: yes Augmentin  for 10 days  Finally, we reviewed reasons to return to care including if symptoms worsen or persist  (despite above treatments) or new concerns arise (particularly fever or shortness of breath or wheeze)  Recommended follow up: Return for as needed for new, worsening, persistent symptoms. Future Appointments  Date Time Provider Department Center  02/23/2024  8:45 AM Claudene Arthea HERO, DO [facility] None  08/08/2024  8:00 AM Katrinka Garnette KIDD, MD [facility]    Lab/Order associations:   ICD-10-CM  1. Bacterial sinusitis  J32.9    B96.89       No orders of the defined types were placed in this encounter.   Return precautions advised.  Garnette Lukes, MD

## 2024-02-21 NOTE — Patient Instructions (Addendum)
 Sinus infection/Sinusitis Bacterial based on: Symptoms >10 days  Treatment: Antibiotic indicated: yes Augmentin  for 10 days  Finally, we reviewed reasons to return to care including if symptoms worsen or persist  (despite above treatments) or new concerns arise (particularly fever or shortness of breath or wheeze)  Recommended follow up: Return for as needed for new, worsening, persistent symptoms.

## 2024-02-22 ENCOUNTER — Encounter: Payer: Self-pay | Admitting: Family Medicine

## 2024-02-22 ENCOUNTER — Other Ambulatory Visit: Payer: Self-pay

## 2024-02-22 NOTE — Progress Notes (Signed)
 [name] [address] [place] [address] Phone: [phone] Subjective:   George [name], am serving as a scribe for Dr. Arthea Claudene.  I'm seeing this patient by the request  of:  George Garnette KIDD, MD  CC: Back and neck pain follow-up  [name]  George [name] is a 58 y.o. male coming in with complaint of back and neck pain. OMT 01/12/2024. Patient states that he has been doing ok since last visit. Same as last visit.   Medications patient has been prescribed: Vit D  Taking:     Recently did see primary care and given Augmentin .    Reviewed prior external information including notes and imaging from previsou exam, outside providers and external EMR if available.   As well as notes that were available from care everywhere and other healthcare systems.  Past medical history, social, surgical and family history all reviewed in electronic medical record.  No pertanent information unless stated regarding to the chief complaint.   Past Medical History:  Diagnosis Date   Allergy 1980   Anxiety    Asthma 2010   with activity Mild   Colon polyps    GERD (gastroesophageal reflux disease)    Seasonal allergies     Allergies  Allergen Reactions   Trazodone  And Nefazodone     Syncopal episode      Review of Systems:  No headache, visual changes, nausea, vomiting, diarrhea, constipation, dizziness, abdominal pain, skin rash, fevers, chills, night sweats, weight loss, swollen lymph nodes, body aches, joint swelling, chest pain, shortness of breath, mood changes. POSITIVE muscle aches  Objective  Blood pressure 122/86, pulse (!) 55, height 5' 8 (1.727 m), weight 185 lb (83.9 kg), SpO2 98%.   General: No apparent distress alert and oriented x3 mood and affect normal, dressed appropriately.  HEENT: Pupils equal, extraocular movements intact  Respiratory: Patient's speak in full sentences and does not appear short of breath   Cardiovascular: No lower extremity edema, non tender, no erythema  Gait MSK:  Back does have some tightness noted.  Some tenderness to palpation noted.  Neck exam does have some limitation in sidebending bilaterally.  Low back still some mild tightness but overall stable.  Osteopathic findings  C2 flexed rotated and side bent right C6 flexed rotated and side bent left T3 extended rotated and side bent right inhaled rib T9 extended rotated and side bent left L2 flexed rotated and side bent right Sacrum right on right       Assessment and Plan:  Lumbar radiculopathy Low back seems to be doing relatively well.  Continue to work on posture.  Continue work on core strength.  Patient will have a little more time to himself so I am hoping that patient will continue to work on improvement.  Responds well to osteopathic manipulation.  Follow-up again in 6 to 8 weeks    Nonallopathic problems  Decision today to treat with OMT was based on Physical Exam  After verbal consent patient was treated with HVLA, ME, FPR techniques in cervical, rib, thoracic, lumbar, and sacral  areas  Patient tolerated the procedure well with improvement in symptoms  Patient given exercises, stretches and lifestyle modifications  See medications in patient instructions if given  Patient will follow up in 4-8 weeks     The above documentation has been reviewed and is accurate and complete Ernestyne Caldwell M Tashima Scarpulla, DO         Note: This dictation was  prepared with Dragon dictation along with smaller phrase technology. Any transcriptional errors that result from this process are unintentional.

## 2024-02-22 NOTE — Telephone Encounter (Signed)
 No antibiotic sent to pharmacy.

## 2024-02-23 ENCOUNTER — Ambulatory Visit: Admitting: Family Medicine

## 2024-02-23 ENCOUNTER — Encounter: Payer: Self-pay | Admitting: Family Medicine

## 2024-02-23 VITALS — BP 122/86 | HR 55 | Ht 68.0 in | Wt 185.0 lb

## 2024-02-23 DIAGNOSIS — M9908 Segmental and somatic dysfunction of rib cage: Secondary | ICD-10-CM

## 2024-02-23 DIAGNOSIS — M9901 Segmental and somatic dysfunction of cervical region: Secondary | ICD-10-CM | POA: Diagnosis not present

## 2024-02-23 DIAGNOSIS — M9903 Segmental and somatic dysfunction of lumbar region: Secondary | ICD-10-CM | POA: Diagnosis not present

## 2024-02-23 DIAGNOSIS — M9904 Segmental and somatic dysfunction of sacral region: Secondary | ICD-10-CM | POA: Diagnosis not present

## 2024-02-23 DIAGNOSIS — M5416 Radiculopathy, lumbar region: Secondary | ICD-10-CM

## 2024-02-23 DIAGNOSIS — M9902 Segmental and somatic dysfunction of thoracic region: Secondary | ICD-10-CM | POA: Diagnosis not present

## 2024-02-23 MED ORDER — AMOXICILLIN-POT CLAVULANATE 875-125 MG PO TABS: 1.0000 | ORAL_TABLET | Freq: Two times a day (BID) | ORAL | 0 refills | Status: AC

## 2024-02-23 NOTE — Assessment & Plan Note (Signed)
 Low back seems to be doing relatively well.  Continue to work on posture.  Continue work on core strength.  Patient will have a little more time to himself so I am hoping that patient will continue to work on improvement.  Responds well to osteopathic manipulation.  Follow-up again in 6 to 8 weeks

## 2024-02-23 NOTE — Addendum Note (Signed)
 Addended by: KATRINKA GARNETTE KIDD on: 02/23/2024 08:34 AM   Modules accepted: Orders

## 2024-04-06 NOTE — Progress Notes (Unsigned)
" °  Darlyn [facility] [address] [place] [address] Phone: [phone] Subjective:    I'm seeing this patient by the request  of:  Katrinka Garnette KIDD, MD  CC:   [name]  George [name] is a 59 y.o. male coming in with complaint of back and neck pain. OMT 02/23/2024. Patient states   Medications patient has been prescribed: Vit D  Taking:         Reviewed prior external information including notes and imaging from previsou exam, outside providers and external EMR if available.   As well as notes that were available from care everywhere and other healthcare systems.  Past medical history, social, surgical and family history all reviewed in electronic medical record.  No pertanent information unless stated regarding to the chief complaint.   Past Medical History:  Diagnosis Date   Allergy 1980   Anxiety    Asthma 2010   with activity Mild   Colon polyps    GERD (gastroesophageal reflux disease)    Seasonal allergies     Allergies[1]   Review of Systems:  No headache, visual changes, nausea, vomiting, diarrhea, constipation, dizziness, abdominal pain, skin rash, fevers, chills, night sweats, weight loss, swollen lymph nodes, body aches, joint swelling, chest pain, shortness of breath, mood changes. POSITIVE muscle aches  Objective  There were no vitals taken for this visit.   General: No apparent distress alert and oriented x3 mood and affect normal, dressed appropriately.  HEENT: Pupils equal, extraocular movements intact  Respiratory: Patient's speak in full sentences and does not appear short of breath  Cardiovascular: No lower extremity edema, non tender, no erythema  Gait MSK:  Back   Osteopathic findings  C2 flexed rotated and side bent right C6 flexed rotated and side bent left T3 extended rotated and side bent right inhaled rib T9 extended rotated and side bent left L2 flexed rotated and side bent right Sacrum right  on right       Assessment and Plan:  No problem-specific Assessment & Plan notes found for this encounter.    Nonallopathic problems  Decision today to treat with OMT was based on Physical Exam  After verbal consent patient was treated with HVLA, ME, FPR techniques in cervical, rib, thoracic, lumbar, and sacral  areas  Patient tolerated the procedure well with improvement in symptoms  Patient given exercises, stretches and lifestyle modifications  See medications in patient instructions if given  Patient will follow up in 4-8 weeks             Note: This dictation was prepared with Dragon dictation along with smaller phrase technology. Any transcriptional errors that result from this process are unintentional.            [1]  Allergies Allergen Reactions   Trazodone  And Nefazodone     Syncopal episode    "

## 2024-04-12 ENCOUNTER — Ambulatory Visit: Admitting: Family Medicine

## 2024-04-15 ENCOUNTER — Other Ambulatory Visit: Payer: Self-pay | Admitting: Family Medicine

## 2024-04-19 NOTE — Progress Notes (Unsigned)
 " [name] [address] [place] [address] Phone: [phone] Subjective:   George [name] George [name], am serving as a scribe for Dr. Arthea Claudene.  I'm seeing this patient by the request  of:  Katrinka Garnette KIDD, MD  CC: Low back and neck pain follow-up  [name]  George [name] is a 59 y.o. male coming in with complaint of back and neck pain. OMT 02/23/2024. Patient states that he has more pain in R glute at night.   When he does hamstring stretches the back of his heel hurts. Able to run without pain.   Medications patient has been prescribed: Vit D  Taking:         Reviewed prior external information including notes and imaging from previsou exam, outside providers and external EMR if available.   As well as notes that were available from care everywhere and other healthcare systems.  Past medical history, social, surgical and family history all reviewed in electronic medical record.  No pertanent information unless stated regarding to the chief complaint.   Past Medical History:  Diagnosis Date   Allergy 1980   Anxiety    Asthma 2010   with activity Mild   Colon polyps    GERD (gastroesophageal reflux disease)    Seasonal allergies     [Allergies]  [Allergies] Allergen Reactions   Trazodone  And Nefazodone     Syncopal episode      Review of Systems:  No headache, visual changes, nausea, vomiting, diarrhea, constipation, dizziness, abdominal pain, skin rash, fevers, chills, night sweats, weight loss, swollen lymph nodes, body aches, joint swelling, chest pain, shortness of breath, mood changes. POSITIVE muscle aches  Objective  Blood pressure 114/82, pulse 87, height 5' 8 (1.727 m), SpO2 100%.   The patient is not taking nitrates, and denies he has access to nitrates in any form at any time. I have counseled him that taking Viagra with nitrates of any form can cause death. Additionally, Viagra serum concentrations can be  increased by the following: cimetidine, erythromycin, itraconazole or ketoconazole . This patient does not take these drugs, but I have counseled him to avoid Viagra if he does take any of these.  We have also discussed the fact that there have been some deaths in patients after taking Viagra, felt due to the exertion of intercourse rather than the drug itself. The patient is aware of this, and accepts whatever unknown degree of risk there is in this aspect.   General: No apparent distress alert and oriented x3 mood and affect normal, dressed appropriately.  HEENT: Pupils equal, extraocular movements intact  Respiratory: Patient's speak in full sentences and does not appear short of breath  Cardiovascular: No lower extremity edema, non tender, no erythema  Gait MSK:  Back mild tightness of the lower back but nothing severe at this time. Some improvement actually in core strength noted.  Some very mild tenderness over the sacroiliac joint and leg piriformis area right greater than left.  Does have some more the tightness noted in the left thoracolumbar juncture.  Osteopathic findings  C2 flexed rotated and side bent right C6 flexed rotated and side bent left T3 extended rotated and side bent right inhaled rib T9 extended rotated and side bent left L3 flexed rotated and side bent right Sacrum right on right     Assessment and Plan:  Lumbar radiculopathy Patient has done very well.  Patient has had more time at this moment.  Getting good  and ready for his birthday.  Very impressed by this.  Discussed icing regimen and home exercises.  Discussed which activities to do and which ones to avoid.  Increase activity slowly.  Follow-up again in 6 to 12 weeks.      Nonallopathic problems  Decision today to treat with OMT was based on Physical Exam  After verbal consent patient was treated with HVLA, ME, FPR techniques in cervical, rib, thoracic, lumbar, and sacral  areas  Patient tolerated  the procedure well with improvement in symptoms  Patient given exercises, stretches and lifestyle modifications  See medications in patient instructions if given  Patient will follow up in 4-8 weeks    The above documentation has been reviewed and is accurate and complete George [name] M George Hammonds, DO          Note: This dictation was prepared with Dragon dictation along with smaller phrase technology. Any transcriptional errors that result from this process are unintentional.         "

## 2024-04-20 ENCOUNTER — Ambulatory Visit: Admitting: Family Medicine

## 2024-04-20 ENCOUNTER — Encounter: Payer: Self-pay | Admitting: Family Medicine

## 2024-04-20 VITALS — BP 114/82 | HR 87 | Ht 68.0 in

## 2024-04-20 DIAGNOSIS — M9904 Segmental and somatic dysfunction of sacral region: Secondary | ICD-10-CM

## 2024-04-20 DIAGNOSIS — M9903 Segmental and somatic dysfunction of lumbar region: Secondary | ICD-10-CM

## 2024-04-20 DIAGNOSIS — M9902 Segmental and somatic dysfunction of thoracic region: Secondary | ICD-10-CM

## 2024-04-20 DIAGNOSIS — M9908 Segmental and somatic dysfunction of rib cage: Secondary | ICD-10-CM

## 2024-04-20 DIAGNOSIS — M9901 Segmental and somatic dysfunction of cervical region: Secondary | ICD-10-CM

## 2024-04-20 DIAGNOSIS — M5416 Radiculopathy, lumbar region: Secondary | ICD-10-CM

## 2024-04-20 NOTE — Assessment & Plan Note (Signed)
 Patient has done very well.  Patient has had more time at this moment.  Getting good and ready for his [age].  Very impressed by this.  Discussed icing regimen and home exercises.  Discussed which activities to do and which ones to avoid.  Increase activity slowly.  Follow-up again in 6 to 12 weeks.

## 2024-06-18 ENCOUNTER — Ambulatory Visit: Admitting: Family Medicine

## 2024-08-06 ENCOUNTER — Encounter: Admitting: Family Medicine

## 2024-08-08 ENCOUNTER — Encounter: Admitting: Family Medicine
